# Patient Record
Sex: Female | Born: 1965 | Race: White | Hispanic: No | Marital: Single | Smoking: Never smoker
Health system: Southern US, Community
[De-identification: ages and names within clinical notes are randomized; demographics above are authoritative.]

## PROBLEM LIST (undated history)

## (undated) DIAGNOSIS — L719 Rosacea, unspecified: Secondary | ICD-10-CM

## (undated) DIAGNOSIS — Z8719 Personal history of other diseases of the digestive system: Secondary | ICD-10-CM

## (undated) DIAGNOSIS — R011 Cardiac murmur, unspecified: Secondary | ICD-10-CM

## (undated) DIAGNOSIS — M19042 Primary osteoarthritis, left hand: Secondary | ICD-10-CM

## (undated) DIAGNOSIS — M797 Fibromyalgia: Secondary | ICD-10-CM

## (undated) DIAGNOSIS — K219 Gastro-esophageal reflux disease without esophagitis: Secondary | ICD-10-CM

## (undated) DIAGNOSIS — F329 Major depressive disorder, single episode, unspecified: Secondary | ICD-10-CM

## (undated) DIAGNOSIS — G43909 Migraine, unspecified, not intractable, without status migrainosus: Secondary | ICD-10-CM

## (undated) DIAGNOSIS — E7800 Pure hypercholesterolemia, unspecified: Secondary | ICD-10-CM

## (undated) DIAGNOSIS — Q2112 Patent foramen ovale: Secondary | ICD-10-CM

## (undated) DIAGNOSIS — D649 Anemia, unspecified: Secondary | ICD-10-CM

## (undated) DIAGNOSIS — I10 Essential (primary) hypertension: Secondary | ICD-10-CM

## (undated) DIAGNOSIS — F32A Depression, unspecified: Secondary | ICD-10-CM

## (undated) DIAGNOSIS — Z8742 Personal history of other diseases of the female genital tract: Secondary | ICD-10-CM

## (undated) DIAGNOSIS — T4145XA Adverse effect of unspecified anesthetic, initial encounter: Secondary | ICD-10-CM

## (undated) DIAGNOSIS — M1990 Unspecified osteoarthritis, unspecified site: Secondary | ICD-10-CM

## (undated) DIAGNOSIS — Q211 Atrial septal defect: Secondary | ICD-10-CM

## (undated) DIAGNOSIS — T8859XA Other complications of anesthesia, initial encounter: Secondary | ICD-10-CM

## (undated) DIAGNOSIS — I639 Cerebral infarction, unspecified: Secondary | ICD-10-CM

## (undated) DIAGNOSIS — J45909 Unspecified asthma, uncomplicated: Secondary | ICD-10-CM

## (undated) DIAGNOSIS — H349 Unspecified retinal vascular occlusion: Secondary | ICD-10-CM

## (undated) DIAGNOSIS — M1611 Unilateral primary osteoarthritis, right hip: Secondary | ICD-10-CM

## (undated) DIAGNOSIS — R51 Headache: Secondary | ICD-10-CM

## (undated) DIAGNOSIS — M19041 Primary osteoarthritis, right hand: Secondary | ICD-10-CM

## (undated) DIAGNOSIS — R519 Headache, unspecified: Secondary | ICD-10-CM

## (undated) DIAGNOSIS — G4700 Insomnia, unspecified: Secondary | ICD-10-CM

## (undated) HISTORY — PX: ABDOMINAL HYSTERECTOMY: SHX81

## (undated) HISTORY — DX: Insomnia, unspecified: G47.00

## (undated) HISTORY — DX: Pure hypercholesterolemia, unspecified: E78.00

## (undated) HISTORY — DX: Primary osteoarthritis, left hand: M19.042

## (undated) HISTORY — DX: Rosacea, unspecified: L71.9

## (undated) HISTORY — DX: Unilateral primary osteoarthritis, right hip: M16.11

## (undated) HISTORY — DX: Migraine, unspecified, not intractable, without status migrainosus: G43.909

## (undated) HISTORY — DX: Unspecified asthma, uncomplicated: J45.909

## (undated) HISTORY — PX: TOE SURGERY: SHX1073

## (undated) HISTORY — PX: KNEE ARTHROPLASTY: SHX992

## (undated) HISTORY — PX: COLONOSCOPY: SHX174

## (undated) HISTORY — PX: COLON SURGERY: SHX602

## (undated) HISTORY — DX: Primary osteoarthritis, right hand: M19.041

## (undated) HISTORY — PX: KNEE SURGERY: SHX244

## (undated) HISTORY — PX: APPENDECTOMY: SHX54

## (undated) HISTORY — DX: Personal history of other diseases of the female genital tract: Z87.42

## (undated) HISTORY — PX: ANKLE SURGERY: SHX546

---

## 1997-12-23 ENCOUNTER — Ambulatory Visit (HOSPITAL_COMMUNITY): Admission: RE | Admit: 1997-12-23 | Discharge: 1997-12-23 | Payer: Self-pay | Admitting: Orthopedic Surgery

## 1998-01-31 ENCOUNTER — Emergency Department (HOSPITAL_COMMUNITY): Admission: EM | Admit: 1998-01-31 | Discharge: 1998-01-31 | Payer: Self-pay | Admitting: Emergency Medicine

## 2007-05-31 ENCOUNTER — Emergency Department (HOSPITAL_COMMUNITY): Admission: EM | Admit: 2007-05-31 | Discharge: 2007-05-31 | Payer: Self-pay | Admitting: Emergency Medicine

## 2007-06-01 ENCOUNTER — Encounter (INDEPENDENT_AMBULATORY_CARE_PROVIDER_SITE_OTHER): Payer: Self-pay | Admitting: Emergency Medicine

## 2007-06-01 ENCOUNTER — Ambulatory Visit: Payer: Self-pay | Admitting: Surgery

## 2007-06-01 ENCOUNTER — Ambulatory Visit (HOSPITAL_COMMUNITY): Admission: RE | Admit: 2007-06-01 | Discharge: 2007-06-01 | Payer: Self-pay | Admitting: Emergency Medicine

## 2007-06-17 ENCOUNTER — Ambulatory Visit: Payer: Self-pay | Admitting: Surgery

## 2007-06-19 ENCOUNTER — Encounter: Admission: RE | Admit: 2007-06-19 | Discharge: 2007-06-19 | Payer: Self-pay | Admitting: Surgery

## 2007-06-25 ENCOUNTER — Encounter: Admission: RE | Admit: 2007-06-25 | Discharge: 2007-06-25 | Payer: Self-pay | Admitting: Family Medicine

## 2007-07-01 ENCOUNTER — Ambulatory Visit: Payer: Self-pay | Admitting: Surgery

## 2007-07-09 ENCOUNTER — Ambulatory Visit (HOSPITAL_COMMUNITY): Admission: RE | Admit: 2007-07-09 | Discharge: 2007-07-09 | Payer: Self-pay | Admitting: Cardiology

## 2007-07-09 ENCOUNTER — Encounter: Payer: Self-pay | Admitting: Cardiology

## 2008-11-18 ENCOUNTER — Encounter: Admission: RE | Admit: 2008-11-18 | Discharge: 2008-11-18 | Payer: Self-pay | Admitting: Family Medicine

## 2010-06-14 NOTE — Letter (Signed)
Jun 17, 2007    Re:  Charlotte Fox, CHANCELLOR                  DOB:  May 27, 1965    REASON FOR VISIT:  Left eye blindness, rule out carotid disease.   REFERRING PHYSICIAN:  Pam Drown, M.D. and Dr. Doris Cheadle. [place].   HISTORY:  This is a 45 year old female who, approximately 2 weeks ago  began having blurry vision in her left eye.  2 days after this, on  Friday, she found that she was missing aspects of her visual field, and  went to have this further evaluated.  She initially thought this was a  variation of her usual migraine aura.  However, when it did not go away,  she went to an ophthalmologist, Dr. Dione Booze.  The patient has undergone  retinal artery imaging, which reveals retinal artery occlusion.  She has  also undergone a full hypercoagulable workup by blood work, as well as a  carotid duplex ultrasound, which shows 40-60% stenosis of a left carotid  artery.  The patient continues to have issues with her vision.  However,  had not gotten worse.  Of note, the patient also has a history of  hypercholesterolemia with a total cholesterol in October of 2008 was  255.  Her triglycerides were 157.  Her LDL was 189.  She has been taking  Lipitor in the past, but secondary to cost, has not.  Her blood work has  been negative for hypercoagulable workup to date.  However, she does  have an elevated sedimentation rate at 46.   REVIEW OF SYSTEMS:  GENERAL:  Negative for weight gain or weight loss.  Negative for fevers or chills.  CARDIAC:  Negative.  PULMONARY:  Negative.  GI:  Positive for the above and hiatal hernia.  GU:  Negative.  VASCULAR:  Temporary blindness in 1 eye.  NEURO:  Positive for headaches, migraines with aura.  ORTHO:  Negative.  PSYCH:  Negative.  ENT:  Recent change in eyesight.  HEMATOLOGIC:  Negative.   PAST MEDICAL HISTORY:  Positive for hypertension and hyperlipidemia.  Migraine with aura.   FAMILY HISTORY:  Mother has arthritis.   SOCIAL HISTORY:  She is  single.  She is a [employer] administrator.  She  does not smoke.  She never smoked.  She does not drink.   MEDICATIONS:  Include Micardis 80/12.5 once a day.  Lipitor 20 mg per  day.  Darvocet p.r.n.  Midrin p.r.n.  Aspirin daily.   ALLERGIES:  Penicillin, Bactrim, Vicodin, Z-Pak, and erythromycin.   PHYSICAL EXAMINATION:  Blood pressure is 108/60, heart rate is 58.  In  general, she is well-appearing in no acute distress.  HEENT:  She is  normocephalic, atraumatic.  Pupils are equal.  Sclerae are anicteric.  Neck is supple.  There are no carotid bruits.  There is no JVD.  Cardiovascular is regular rate and rhythm without murmurs, rubs, or  gallops.  Pulmonary, lungs are clear bilaterally.  Abdomen is soft.  Extremities are warm and well-perfused.  Psych, she is alert and  oriented x3.  Neuro, cranial nerves 2-12 grossly intact.  Skin is  without rash.   ASSESSMENT AND PLAN:  Left eye blindness.   PLAN:  I had a long conversation with the patient, in excess of 30  minutes discussing possible etiologies for her problem.  I told her  that, from my perspective, if it were to be vascular in origin, it  would  fall into 1 or 2 categories, 1 being atherosclerosis and 2 being a  vasculitis.  To address the atherosclerosis based on our ultrasound, I  do not feel that this is going to be a significant stenosis.  However, I  feel that this needs to be better evaluated.  We talked about a CT scan  versus an arteriogram.  I feel that CT scan would be less invasive and  potentially give [place] more information, so I have scheduled her for a CT  angiogram of her chest, head, and neck.   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  She did have an elevated sed rate.  She is a relatively young female.  For this reason, I am going to have her be further evaluated by a  rheumatologist.  I also think, to complete her workup, she needs to have  an echocardiogram, which I am  scheduling.  I am going to have the  patient follow up with me in 2 weeks.   Jorge Ny, MD  Electronically Signed   VWB/MEDQ  D:  06/17/2007  T:  06/18/2007  Job:  [id number]   cc:   Pam Drown, M.D.  Dr. Doris Cheadle. [place]

## 2010-06-14 NOTE — Assessment & Plan Note (Signed)
OFFICE VISIT   Charlotte Fox, Charlotte Fox  DOB:  1965/09/23                                       07/01/2007  EAVWU#:[id number]   REASON FOR VISIT:  Followup.   HISTORY:  This is a 45 year old female that I saw for evaluation of  blurry vision in her left eye.  This was associated with a carotid  duplex which revealed 40-60% stenosis.  When I saw her, I was  essentially concerned about vasculitis, rather than atherosclerotic  disease given her age, as well as the fact that she had an elevated sed  rate.  I had referred her to rheumatology, Dr. Corliss Skains.  Since our  last visit she has been [facility] for an increase in her sed  rate which went to 79.  She was, however, at this time found to have a  bad sinus infection, so she was not treated with steroids.  Her sinus  infection is resolving and she is to be due to get her sed rate  rechecked.  I did order a CT angiogram of her head, neck and chest which  did not show any signs of vasculitis.  The stenosis within her carotid  found by ultrasound was found be minimal on CT scan.  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  It sounds like she is being scheduled to have this closed.   At this time I feel like there is no vascular surgical role in her care.  This is likely going to be medical management.  I am going to have her  follow up with me in 6 months.   Jorge Ny, MD  Electronically Signed   VWB/MEDQ  D:  07/01/2007  T:  07/02/2007  Job:  [id number]   cc:   Pam Drown, M.D.  Robert L. Dione Booze, M.D.  Dr. Corliss Skains

## 2011-08-01 ENCOUNTER — Ambulatory Visit: Payer: 59 | Attending: Family Medicine | Admitting: Physical Therapy

## 2011-08-01 DIAGNOSIS — M2550 Pain in unspecified joint: Secondary | ICD-10-CM | POA: Insufficient documentation

## 2011-08-01 DIAGNOSIS — M545 Low back pain, unspecified: Secondary | ICD-10-CM | POA: Insufficient documentation

## 2011-08-01 DIAGNOSIS — M6281 Muscle weakness (generalized): Secondary | ICD-10-CM | POA: Insufficient documentation

## 2011-08-01 DIAGNOSIS — IMO0001 Reserved for inherently not codable concepts without codable children: Secondary | ICD-10-CM | POA: Insufficient documentation

## 2011-08-01 DIAGNOSIS — R269 Unspecified abnormalities of gait and mobility: Secondary | ICD-10-CM | POA: Insufficient documentation

## 2011-08-02 ENCOUNTER — Encounter: Payer: 59 | Admitting: Physical Therapy

## 2011-08-07 ENCOUNTER — Ambulatory Visit: Payer: 59 | Admitting: Physical Therapy

## 2011-08-08 ENCOUNTER — Ambulatory Visit: Payer: 59 | Admitting: Physical Therapy

## 2011-08-15 ENCOUNTER — Ambulatory Visit: Payer: 59 | Admitting: Physical Therapy

## 2011-08-17 ENCOUNTER — Ambulatory Visit: Payer: 59 | Admitting: Physical Therapy

## 2011-08-31 ENCOUNTER — Ambulatory Visit: Payer: 59 | Attending: Family Medicine | Admitting: Physical Therapy

## 2011-08-31 DIAGNOSIS — M2550 Pain in unspecified joint: Secondary | ICD-10-CM | POA: Insufficient documentation

## 2011-08-31 DIAGNOSIS — R269 Unspecified abnormalities of gait and mobility: Secondary | ICD-10-CM | POA: Insufficient documentation

## 2011-08-31 DIAGNOSIS — M545 Low back pain, unspecified: Secondary | ICD-10-CM | POA: Insufficient documentation

## 2011-08-31 DIAGNOSIS — M6281 Muscle weakness (generalized): Secondary | ICD-10-CM | POA: Insufficient documentation

## 2011-08-31 DIAGNOSIS — IMO0001 Reserved for inherently not codable concepts without codable children: Secondary | ICD-10-CM | POA: Insufficient documentation

## 2011-09-11 ENCOUNTER — Ambulatory Visit: Payer: 59 | Admitting: Physical Therapy

## 2011-09-18 ENCOUNTER — Encounter: Payer: 59 | Admitting: Physical Therapy

## 2012-01-17 ENCOUNTER — Other Ambulatory Visit: Payer: Self-pay | Admitting: Family Medicine

## 2012-01-17 DIAGNOSIS — R1032 Left lower quadrant pain: Secondary | ICD-10-CM

## 2012-01-18 ENCOUNTER — Ambulatory Visit
Admission: RE | Admit: 2012-01-18 | Discharge: 2012-01-18 | Disposition: A | Discharge status: Home / Self Care | Payer: 59 | Source: Ambulatory Visit | Attending: Family Medicine | Admitting: Family Medicine

## 2012-01-18 DIAGNOSIS — R1032 Left lower quadrant pain: Secondary | ICD-10-CM

## 2012-01-18 MED ORDER — IOHEXOL 300 MG/ML  SOLN
125.0000 mL | Freq: Once | INTRAMUSCULAR | Status: AC | PRN
  Administered 2012-01-18: 125 mL via INTRAVENOUS

## 2012-01-19 ENCOUNTER — Telehealth: Payer: Self-pay | Admitting: Gynecologic Oncology

## 2012-01-19 ENCOUNTER — Ambulatory Visit
Admission: RE | Admit: 2012-01-19 | Discharge: 2012-01-19 | Disposition: A | Discharge status: Home / Self Care | Payer: 59 | Source: Ambulatory Visit | Attending: Family Medicine | Admitting: Family Medicine

## 2012-01-19 ENCOUNTER — Other Ambulatory Visit: Payer: Self-pay | Admitting: Family Medicine

## 2012-01-19 NOTE — Telephone Encounter (Signed)
Initially spoke with Dr. Gweneth Dimitri about scheduling the patient for a new patient appointment for evaluation of a large, soft tissue mass possibly arising from the right adnexa.  No available appointments in the [place] office until Jan. 2, 2013 and she reports that the patient is uncomfortable.  [facility] contacted and new patient appt arranged for 01/22/12 at 1:30pm with Dr. Kyla Balzarine.  Dr. Corliss Blacker notified and to fax the records to [facility].  Pt notified and given directions.  No concerns voiced.  Instructed to call the [facility] or [facility] office for any questions or concerns.

## 2012-07-23 ENCOUNTER — Ambulatory Visit: Payer: Self-pay | Admitting: Family Medicine

## 2012-07-29 ENCOUNTER — Ambulatory Visit: Payer: Self-pay | Admitting: Family Medicine

## 2012-10-09 DIAGNOSIS — H34232 Retinal artery branch occlusion, left eye: Secondary | ICD-10-CM | POA: Insufficient documentation

## 2012-10-09 DIAGNOSIS — H469 Unspecified optic neuritis: Secondary | ICD-10-CM | POA: Insufficient documentation

## 2012-10-09 DIAGNOSIS — H35069 Retinal vasculitis, unspecified eye: Secondary | ICD-10-CM | POA: Insufficient documentation

## 2013-01-29 ENCOUNTER — Ambulatory Visit: Payer: Self-pay | Admitting: Family Medicine

## 2013-10-19 ENCOUNTER — Encounter (HOSPITAL_COMMUNITY): Payer: Self-pay | Admitting: Emergency Medicine

## 2013-10-19 ENCOUNTER — Emergency Department (HOSPITAL_COMMUNITY)
Admission: EM | Admit: 2013-10-19 | Discharge: 2013-10-19 | Disposition: A | Discharge status: Home / Self Care | Payer: 59 | Source: Home / Self Care | Attending: Emergency Medicine | Admitting: Emergency Medicine

## 2013-10-19 DIAGNOSIS — L02619 Cutaneous abscess of unspecified foot: Secondary | ICD-10-CM

## 2013-10-19 DIAGNOSIS — K219 Gastro-esophageal reflux disease without esophagitis: Secondary | ICD-10-CM | POA: Insufficient documentation

## 2013-10-19 DIAGNOSIS — Z880 Allergy status to penicillin: Secondary | ICD-10-CM | POA: Insufficient documentation

## 2013-10-19 DIAGNOSIS — L03119 Cellulitis of unspecified part of limb: Secondary | ICD-10-CM

## 2013-10-19 DIAGNOSIS — M129 Arthropathy, unspecified: Secondary | ICD-10-CM

## 2013-10-19 DIAGNOSIS — M7989 Other specified soft tissue disorders: Secondary | ICD-10-CM

## 2013-10-19 DIAGNOSIS — I10 Essential (primary) hypertension: Secondary | ICD-10-CM

## 2013-10-19 DIAGNOSIS — E7800 Pure hypercholesterolemia, unspecified: Secondary | ICD-10-CM

## 2013-10-19 DIAGNOSIS — Z79899 Other long term (current) drug therapy: Secondary | ICD-10-CM | POA: Insufficient documentation

## 2013-10-19 DIAGNOSIS — M79609 Pain in unspecified limb: Secondary | ICD-10-CM

## 2013-10-19 HISTORY — DX: Pure hypercholesterolemia, unspecified: E78.00

## 2013-10-19 HISTORY — DX: Unspecified osteoarthritis, unspecified site: M19.90

## 2013-10-19 HISTORY — DX: Unspecified retinal vascular occlusion: H34.9

## 2013-10-19 HISTORY — DX: Essential (primary) hypertension: I10

## 2013-10-19 HISTORY — DX: Gastro-esophageal reflux disease without esophagitis: K21.9

## 2013-10-19 LAB — CBC WITH DIFFERENTIAL/PLATELET
BASOS ABS: 0 10*3/uL (ref 0.0–0.1)
Basophils Relative: 0 % (ref 0–1)
Eosinophils Absolute: 0 10*3/uL (ref 0.0–0.7)
Eosinophils Relative: 0 % (ref 0–5)
HEMATOCRIT: 39.1 % (ref 36.0–46.0)
Hemoglobin: 12.9 g/dL (ref 12.0–15.0)
LYMPHS ABS: 1.1 10*3/uL (ref 0.7–4.0)
LYMPHS PCT: 8 % — AB (ref 12–46)
MCH: 28.9 pg (ref 26.0–34.0)
MCHC: 33 g/dL (ref 30.0–36.0)
MCV: 87.7 fL (ref 78.0–100.0)
MONO ABS: 0.7 10*3/uL (ref 0.1–1.0)
Monocytes Relative: 6 % (ref 3–12)
NEUTROS ABS: 10.6 10*3/uL — AB (ref 1.7–7.7)
NEUTROS PCT: 86 % — AB (ref 43–77)
Platelets: 219 10*3/uL (ref 150–400)
RBC: 4.46 MIL/uL (ref 3.87–5.11)
RDW: 14.2 % (ref 11.5–15.5)
WBC: 12.4 10*3/uL — ABNORMAL HIGH (ref 4.0–10.5)

## 2013-10-19 LAB — BASIC METABOLIC PANEL
ANION GAP: 13 (ref 5–15)
BUN: 20 mg/dL (ref 6–23)
CALCIUM: 9.1 mg/dL (ref 8.4–10.5)
CO2: 24 meq/L (ref 19–32)
CREATININE: 1.12 mg/dL — AB (ref 0.50–1.10)
Chloride: 100 mEq/L (ref 96–112)
GFR calc Af Amer: 67 mL/min — ABNORMAL LOW (ref >90)
GFR calc non Af Amer: 58 mL/min — ABNORMAL LOW (ref >90)
Glucose, Bld: 134 mg/dL — ABNORMAL HIGH (ref 70–99)
POTASSIUM: 3.3 meq/L — AB (ref 3.7–5.3)
SODIUM: 137 meq/L (ref 137–147)

## 2013-10-19 NOTE — ED Notes (Signed)
Pt aware urine sample is needed, cup provided 

## 2013-10-19 NOTE — ED Notes (Signed)
US at bedside

## 2013-10-19 NOTE — ED Provider Notes (Signed)
Medical screening examination/treatment/procedure(s) were conducted as a shared visit with non-physician practitioner(s) and myself.  I personally evaluated the patient during the encounter.   EKG Interpretation None      48 year old female presenting with swelling and redness in her right lower leg.  On exam, well appearing, nontoxic, not distressed, normal respiratory effort, normal perfusion, right leg with mild edema, erythema from foot to mid shin, increased work of, tenderness to palpation of skin, skin indurated, no masses or fluctuance. Normal right DP pulses.  DVT study negative. Exam consistent with cellulitis. She was given antibiotics by urgent care prior to coming to the emergency department and will take those. Given return precautions.  Clinical Impression: 1. Cellulitis of foot       Candyce Churn III, MD 10/19/13 2147

## 2013-10-19 NOTE — ED Notes (Signed)
Pt c/o right lower leg swelling and erythema that has gotten worse over the past day or so. Pt was seen at Urgent care and referred here to rule out DVT.

## 2013-10-19 NOTE — Progress Notes (Signed)
VASCULAR LAB PRELIMINARY  PRELIMINARY  PRELIMINARY  PRELIMINARY  Right lower extremity venous Doppler completed.    Preliminary report:  There is no DVT or SVT noted in the right lower extremity.  Mahonri Seiden, RVT 10/19/2013, 8:09 PM

## 2013-10-19 NOTE — ED Provider Notes (Signed)
CSN: [id number]     Arrival date & time 10/19/13  1837 History   First MD Initiated Contact with Patient 10/19/13 1858     Chief Complaint  Patient presents with  . Leg Swelling     (Consider location/radiation/quality/duration/timing/severity/associated sxs/prior Treatment) HPI Charlotte Fox is a 48 y.o. female history of hypertension, hyperlipidemia, retinal artery occlusion comes in for evaluation of right foot pain. She was seen earlier today at an urgent care center and sent over here for DVT rule out. Patient reports Friday evening she noticed her foot was getting sore and woke up Saturday morning with a temperature of 99.5, her foot swollen and red.She denies any injuries or recent infections. She reports lying in the bed with her foot propped up relieves the pain, leg or foot pain over the bed makes it worse. She tried Tylenol and ibuprofen without relief. Denies any difficulty breathing, chest pain, numbness or weakness  Past Medical History  Diagnosis Date  . Hypertension   . High cholesterol   . Arthritis   . Acid reflux   . Retinal artery occlusion    Past Surgical History  Procedure Laterality Date  . Abdominal hysterectomy    . Knee surgery    . Toe surgery     No family history on file. History  Substance Use Topics  . Smoking status: Never Smoker   . Smokeless tobacco: Not on file  . Alcohol Use: No   OB History   Grav Para Term Preterm Abortions TAB SAB Ect Mult Living                 Review of Systems  Constitutional: Negative for fever.  HENT: Negative for sore throat.   Eyes: Negative for visual disturbance.  Respiratory: Negative for shortness of breath.   Cardiovascular: Positive for leg swelling. Negative for chest pain.       Swelling to right foot  Gastrointestinal: Negative for abdominal pain.  Endocrine: Negative for polyuria.  Genitourinary: Negative for dysuria.  Skin: Negative for rash.  Neurological: Negative for weakness, numbness  and headaches.      Allergies  Sulfa antibiotics; Levaquin; Penicillins; and Vicodin  Home Medications   Prior to Admission medications   Medication Sig Start Date End Date Taking? Authorizing Provider  acetaminophen (TYLENOL) 500 MG tablet Take 500 mg by mouth every 6 (six) hours as needed for mild pain.   Yes Historical Provider, MD  atorvastatin (LIPITOR) 80 MG tablet Take 80 mg by mouth daily.   Yes Historical Provider, MD  cholecalciferol (VITAMIN D) 1000 UNITS tablet Take 4,000 Units by mouth daily.   Yes Historical Provider, MD  cyclobenzaprine (FLEXERIL) 10 MG tablet Take 10 mg by mouth 3 (three) times daily as needed for muscle spasms.   Yes Historical Provider, MD  estradiol (CLIMARA - DOSED IN MG/24 HR) 0.05 mg/24hr patch Place 0.05 mg onto the skin once a week. Change patch every Tuesday.   Yes Historical Provider, MD  FLUoxetine (PROZAC) 20 MG capsule Take 20 mg by mouth daily.   Yes Historical Provider, MD  ibuprofen (ADVIL,MOTRIN) 200 MG tablet Take 200 mg by mouth every 6 (six) hours as needed for mild pain.   Yes Historical Provider, MD  Misc Natural Products (TART CHERRY ADVANCED PO) Take 1 capsule by mouth daily at 12 noon.   Yes Historical Provider, MD  omeprazole (PRILOSEC) 20 MG capsule Take 20 mg by mouth daily.   Yes Historical Provider, MD  promethazine (PHENERGAN)  25 MG tablet Take 25 mg by mouth every 6 (six) hours as needed for nausea or vomiting.   Yes Historical Provider, MD  telmisartan (MICARDIS) 80 MG tablet Take 80 mg by mouth daily.   Yes Historical Provider, MD  traMADol (ULTRAM) 50 MG tablet Take by mouth every 6 (six) hours as needed for moderate pain.   Yes Historical Provider, MD   BP 108/61  Pulse 88  Temp(Src) 98.1 F (36.7 C) (Oral)  Resp 18  Ht 5' 6.5" (1.689 m)  Wt 236 lb 6 oz (107.219 kg)  BMI 37.58 kg/m2  SpO2 100%  LMP 12/31/2011 Physical Exam  Nursing note and vitals reviewed. Constitutional:  Awake, alert, nontoxic appearance  with baseline speech for patient.  HENT:  Head: Atraumatic.  Mouth/Throat: No oropharyngeal exudate.  Eyes: EOM are normal. Pupils are equal, round, and reactive to light. Right eye exhibits no discharge. Left eye exhibits no discharge.  Neck: Neck supple.  Cardiovascular: Normal rate and regular rhythm.   No murmur heard. Pulmonary/Chest: Effort normal and breath sounds normal. No stridor. No respiratory distress. She has no wheezes. She has no rales. She exhibits no tenderness.  Abdominal: Soft. Bowel sounds are normal. She exhibits no mass. There is no tenderness. There is no rebound.  Musculoskeletal: She exhibits no tenderness.  Baseline ROM, moves extremities with no obvious new focal weakness.  Lymphadenopathy:    She has no cervical adenopathy.  Neurological:  Awake, alert, cooperative and aware of situation; motor strength bilaterally; sensation normal to light touch bilaterally; no facial asymmetry; tongue midline; major cranial nerves appear intact; baseline gait without new ataxia.  Skin: No rash noted. There is erythema.  Hot to touch over swollen, erythematous areas.  Psychiatric: She has a normal mood and affect.        ED Course  Procedures (including critical care time) Labs Review Labs Reviewed  CBC WITH DIFFERENTIAL - Abnormal; Notable for the following:    WBC 12.4 (*)    Neutrophils Relative % 86 (*)    Neutro Abs 10.6 (*)    Lymphocytes Relative 8 (*)    All other components within normal limits  BASIC METABOLIC PANEL - Abnormal; Notable for the following:    Potassium 3.3 (*)    Glucose, Bld 134 (*)    Creatinine, Ser 1.12 (*)    GFR calc non Af Amer 58 (*)    GFR calc Af Amer 67 (*)    All other components within normal limits  URINALYSIS, ROUTINE W REFLEX MICROSCOPIC    Imaging Review No results found.   EKG Interpretation None      MDM  Vitals stable - WNL -afebrile Pt resting comfortably in ED. US showed no DVT. PE consistent with  cellulitis. Clinical picture not consistent with necrotizing fasciitis. No evidence of vascular compromise. Patient heart he has a prescription for doxycycline she received from the urgent care center earlier. She may continue with this treatment. Patient reports having excellent access to care, and sees Selena Batten for primary care. Discussed f/u with PCP and return precautions, pt very amenable to plan.  Prior to patient discharge, I discussed and reviewed this case with Dr.Wofford.    Final diagnoses:  Cellulitis of foot        Sharlene Motts, PA-C 10/20/13 1326

## 2013-10-20 ENCOUNTER — Inpatient Hospital Stay (HOSPITAL_COMMUNITY): Payer: 59

## 2013-10-20 ENCOUNTER — Encounter (HOSPITAL_COMMUNITY): Payer: Self-pay | Admitting: Emergency Medicine

## 2013-10-20 ENCOUNTER — Inpatient Hospital Stay (HOSPITAL_COMMUNITY)
Admission: EM | Admit: 2013-10-20 | Discharge: 2013-10-24 | DRG: 603 | Disposition: A | Discharge status: Home / Self Care | Payer: 59 | Attending: Internal Medicine | Admitting: Internal Medicine

## 2013-10-20 DIAGNOSIS — I10 Essential (primary) hypertension: Secondary | ICD-10-CM | POA: Diagnosis present

## 2013-10-20 DIAGNOSIS — M129 Arthropathy, unspecified: Secondary | ICD-10-CM | POA: Diagnosis present

## 2013-10-20 DIAGNOSIS — K219 Gastro-esophageal reflux disease without esophagitis: Secondary | ICD-10-CM | POA: Diagnosis present

## 2013-10-20 DIAGNOSIS — Z6838 Body mass index (BMI) 38.0-38.9, adult: Secondary | ICD-10-CM | POA: Diagnosis not present

## 2013-10-20 DIAGNOSIS — E7800 Pure hypercholesterolemia, unspecified: Secondary | ICD-10-CM | POA: Diagnosis present

## 2013-10-20 DIAGNOSIS — E876 Hypokalemia: Secondary | ICD-10-CM | POA: Diagnosis present

## 2013-10-20 DIAGNOSIS — M7989 Other specified soft tissue disorders: Secondary | ICD-10-CM | POA: Diagnosis present

## 2013-10-20 DIAGNOSIS — F39 Unspecified mood [affective] disorder: Secondary | ICD-10-CM | POA: Diagnosis present

## 2013-10-20 DIAGNOSIS — D649 Anemia, unspecified: Secondary | ICD-10-CM | POA: Diagnosis present

## 2013-10-20 DIAGNOSIS — R112 Nausea with vomiting, unspecified: Secondary | ICD-10-CM | POA: Diagnosis present

## 2013-10-20 DIAGNOSIS — Z79899 Other long term (current) drug therapy: Secondary | ICD-10-CM | POA: Diagnosis not present

## 2013-10-20 DIAGNOSIS — L03115 Cellulitis of right lower limb: Secondary | ICD-10-CM | POA: Diagnosis present

## 2013-10-20 DIAGNOSIS — L02619 Cutaneous abscess of unspecified foot: Secondary | ICD-10-CM | POA: Diagnosis present

## 2013-10-20 DIAGNOSIS — L03119 Cellulitis of unspecified part of limb: Principal | ICD-10-CM

## 2013-10-20 LAB — COMPREHENSIVE METABOLIC PANEL
ALT: 18 U/L (ref 0–35)
AST: 16 U/L (ref 0–37)
Albumin: 3.5 g/dL (ref 3.5–5.2)
Alkaline Phosphatase: 86 U/L (ref 39–117)
Anion gap: 16 — ABNORMAL HIGH (ref 5–15)
BUN: 19 mg/dL (ref 6–23)
CO2: 22 mEq/L (ref 19–32)
Calcium: 9.5 mg/dL (ref 8.4–10.5)
Chloride: 98 mEq/L (ref 96–112)
Creatinine, Ser: 1.06 mg/dL (ref 0.50–1.10)
GFR calc Af Amer: 71 mL/min — ABNORMAL LOW (ref >90)
GFR calc non Af Amer: 62 mL/min — ABNORMAL LOW (ref >90)
Glucose, Bld: 111 mg/dL — ABNORMAL HIGH (ref 70–99)
Potassium: 4 mEq/L (ref 3.7–5.3)
Sodium: 136 mEq/L — ABNORMAL LOW (ref 137–147)
Total Bilirubin: 0.7 mg/dL (ref 0.3–1.2)
Total Protein: 8.5 g/dL — ABNORMAL HIGH (ref 6.0–8.3)

## 2013-10-20 LAB — CBC WITH DIFFERENTIAL/PLATELET
Basophils Absolute: 0 10*3/uL (ref 0.0–0.1)
Basophils Relative: 0 % (ref 0–1)
Eosinophils Absolute: 0 10*3/uL (ref 0.0–0.7)
Eosinophils Relative: 0 % (ref 0–5)
HCT: 41.2 % (ref 36.0–46.0)
Hemoglobin: 13.7 g/dL (ref 12.0–15.0)
Lymphocytes Relative: 10 % — ABNORMAL LOW (ref 12–46)
Lymphs Abs: 1.3 10*3/uL (ref 0.7–4.0)
MCH: 29.3 pg (ref 26.0–34.0)
MCHC: 33.3 g/dL (ref 30.0–36.0)
MCV: 88.2 fL (ref 78.0–100.0)
Monocytes Absolute: 0.8 10*3/uL (ref 0.1–1.0)
Monocytes Relative: 7 % (ref 3–12)
Neutro Abs: 10.6 10*3/uL — ABNORMAL HIGH (ref 1.7–7.7)
Neutrophils Relative %: 83 % — ABNORMAL HIGH (ref 43–77)
Platelets: 266 10*3/uL (ref 150–400)
RBC: 4.67 MIL/uL (ref 3.87–5.11)
RDW: 14.3 % (ref 11.5–15.5)
WBC: 12.7 10*3/uL — ABNORMAL HIGH (ref 4.0–10.5)

## 2013-10-20 LAB — CK: CK TOTAL: 34 U/L (ref 7–177)

## 2013-10-20 MED ORDER — ENOXAPARIN SODIUM 60 MG/0.6ML ~~LOC~~ SOLN
50.0000 mg | SUBCUTANEOUS | Status: DC
  Administered 2013-10-20 – 2013-10-23 (×4): 50 mg via SUBCUTANEOUS
  Filled 2013-10-20 (×5): qty 0.6

## 2013-10-20 MED ORDER — ESTRADIOL 0.05 MG/24HR TD PTWK
0.0500 mg | MEDICATED_PATCH | TRANSDERMAL | Status: DC
  Administered 2013-10-21: 0.05 mg via TRANSDERMAL
  Filled 2013-10-20: qty 1

## 2013-10-20 MED ORDER — VANCOMYCIN HCL IN DEXTROSE 1-5 GM/200ML-% IV SOLN
1000.0000 mg | Freq: Two times a day (BID) | INTRAVENOUS | Status: DC
  Administered 2013-10-21 – 2013-10-23 (×5): 1000 mg via INTRAVENOUS
  Filled 2013-10-20 (×5): qty 200

## 2013-10-20 MED ORDER — SODIUM CHLORIDE 0.9 % IV SOLN
INTRAVENOUS | Status: DC
  Administered 2013-10-21: 02:00:00 via INTRAVENOUS

## 2013-10-20 MED ORDER — ACETAMINOPHEN 650 MG RE SUPP: 650.0000 mg | Freq: Four times a day (QID) | RECTAL | Status: DC | PRN

## 2013-10-20 MED ORDER — ACETAMINOPHEN 325 MG PO TABS
650.0000 mg | ORAL_TABLET | Freq: Four times a day (QID) | ORAL | Status: DC | PRN
  Administered 2013-10-23: 650 mg via ORAL
  Filled 2013-10-20: qty 2

## 2013-10-20 MED ORDER — FLUOXETINE HCL 20 MG PO CAPS
20.0000 mg | ORAL_CAPSULE | Freq: Every day | ORAL | Status: DC
  Administered 2013-10-21 – 2013-10-24 (×4): 20 mg via ORAL
  Filled 2013-10-20 (×5): qty 1

## 2013-10-20 MED ORDER — ONDANSETRON HCL 4 MG/2ML IJ SOLN: 4.0000 mg | Freq: Four times a day (QID) | INTRAMUSCULAR | Status: DC | PRN

## 2013-10-20 MED ORDER — FAMOTIDINE IN NACL 20-0.9 MG/50ML-% IV SOLN
20.0000 mg | Freq: Two times a day (BID) | INTRAVENOUS | Status: DC
  Administered 2013-10-21 – 2013-10-22 (×2): 20 mg via INTRAVENOUS
  Filled 2013-10-20 (×6): qty 50

## 2013-10-20 MED ORDER — HYDROMORPHONE HCL 1 MG/ML IJ SOLN
1.0000 mg | Freq: Once | INTRAMUSCULAR | Status: DC
  Filled 2013-10-20: qty 1

## 2013-10-20 MED ORDER — CYCLOBENZAPRINE HCL 10 MG PO TABS
10.0000 mg | ORAL_TABLET | Freq: Three times a day (TID) | ORAL | Status: DC | PRN
  Filled 2013-10-20: qty 1

## 2013-10-20 MED ORDER — ONDANSETRON HCL 4 MG PO TABS: 4.0000 mg | ORAL_TABLET | Freq: Four times a day (QID) | ORAL | Status: DC | PRN

## 2013-10-20 MED ORDER — SODIUM CHLORIDE 0.9 % IV BOLUS (SEPSIS)
1000.0000 mL | Freq: Once | INTRAVENOUS | Status: AC
  Administered 2013-10-20: 1000 mL via INTRAVENOUS

## 2013-10-20 MED ORDER — HYDROMORPHONE HCL 1 MG/ML IJ SOLN
0.5000 mg | INTRAMUSCULAR | Status: DC | PRN
  Administered 2013-10-20 – 2013-10-21 (×3): 0.5 mg via INTRAVENOUS
  Filled 2013-10-20 (×4): qty 1

## 2013-10-20 MED ORDER — OXYCODONE-ACETAMINOPHEN 5-325 MG PO TABS
1.0000 | ORAL_TABLET | Freq: Four times a day (QID) | ORAL | Status: DC | PRN
  Administered 2013-10-20 – 2013-10-21 (×2): 1 via ORAL
  Administered 2013-10-21: 2 via ORAL
  Administered 2013-10-21: 1 via ORAL
  Filled 2013-10-20 (×2): qty 1
  Filled 2013-10-20: qty 2
  Filled 2013-10-20: qty 1

## 2013-10-20 MED ORDER — VANCOMYCIN HCL 10 G IV SOLR
2000.0000 mg | Freq: Once | INTRAVENOUS | Status: AC
  Administered 2013-10-20: 2000 mg via INTRAVENOUS
  Filled 2013-10-20: qty 2000

## 2013-10-20 MED ORDER — ONDANSETRON HCL 4 MG/2ML IJ SOLN
4.0000 mg | Freq: Once | INTRAMUSCULAR | Status: DC
  Filled 2013-10-20: qty 2

## 2013-10-20 NOTE — ED Provider Notes (Signed)
CSN: [id number]     Arrival date & time 10/20/13  1405 History   First MD Initiated Contact with Patient 10/20/13 1930     Chief Complaint  Patient presents with  . Emesis  . Nausea     (Consider location/radiation/quality/duration/timing/severity/associated sxs/prior Treatment) HPI  48 year old female with cellulitis her right lower extremity. Patient was just seen in the emergency room for the same. She was prescribed doxycycline. She reports that she has been unable to keep this down secondary to nausea/vomiting. Her symptoms have progressed since last seen. She reports increasing pain, swelling and redness. Subjective fever. Feels achy all over. Has been taking ibuprofen and Tylenol which helps with her fever but does not provide any significant pain relief.  Past Medical History  Diagnosis Date  . Hypertension   . High cholesterol   . Arthritis   . Acid reflux   . Retinal artery occlusion    Past Surgical History  Procedure Laterality Date  . Abdominal hysterectomy    . Knee surgery    . Toe surgery     History reviewed. No pertinent family history. History  Substance Use Topics  . Smoking status: Never Smoker   . Smokeless tobacco: Never Used  . Alcohol Use: No   OB History   Grav Para Term Preterm Abortions TAB SAB Ect Mult Living                 Review of Systems  All systems reviewed and negative, other than as noted in HPI.   Allergies  Erythromycin; Sulfa antibiotics; Azithromycin; Levaquin; Penicillins; and Vicodin  Home Medications   Prior to Admission medications   Medication Sig Start Date End Date Taking? Authorizing Provider  acetaminophen (TYLENOL) 500 MG tablet Take 1,000 mg by mouth every 6 (six) hours as needed for mild pain.    Yes Historical Provider, MD  atorvastatin (LIPITOR) 80 MG tablet Take 80 mg by mouth daily.   Yes Historical Provider, MD  cholecalciferol (VITAMIN D) 1000 UNITS tablet Take 4,000 Units by mouth daily.   Yes  Historical Provider, MD  cyclobenzaprine (FLEXERIL) 10 MG tablet Take 10 mg by mouth 3 (three) times daily as needed for muscle spasms.   Yes Historical Provider, MD  estradiol (CLIMARA - DOSED IN MG/24 HR) 0.05 mg/24hr patch Place 0.05 mg onto the skin once a week. Change patch every Tuesday.   Yes Historical Provider, MD  FLUoxetine (PROZAC) 20 MG capsule Take 20 mg by mouth daily.   Yes Historical Provider, MD  ibuprofen (ADVIL,MOTRIN) 200 MG tablet Take 600 mg by mouth every 6 (six) hours as needed for mild pain.    Yes Historical Provider, MD  Misc Natural Products (TART CHERRY ADVANCED PO) Take 1 capsule by mouth daily at 12 noon.   Yes Historical Provider, MD  omeprazole (PRILOSEC) 20 MG capsule Take 20 mg by mouth daily.   Yes Historical Provider, MD  promethazine (PHENERGAN) 25 MG tablet Take 25 mg by mouth every 6 (six) hours as needed for nausea or vomiting.   Yes Historical Provider, MD  telmisartan (MICARDIS) 80 MG tablet Take 80 mg by mouth daily.   Yes Historical Provider, MD  traMADol (ULTRAM) 50 MG tablet Take 50 mg by mouth every 6 (six) hours as needed for moderate pain.    Yes Historical Provider, MD   BP 141/65  Pulse 87  Temp(Src) 99.4 F (37.4 C) (Oral)  Resp 18  SpO2 100%  LMP 12/31/2011 Physical Exam  Nursing  note and vitals reviewed. Constitutional: She appears well-developed and well-nourished. No distress.  HENT:  Head: Normocephalic and atraumatic.  Eyes: Conjunctivae are normal. Right eye exhibits no discharge. Left eye exhibits no discharge.  Neck: Neck supple.  Cardiovascular: Normal rate, regular rhythm and normal heart sounds.  Exam reveals no gallop and no friction rub.   No murmur heard. Pulmonary/Chest: Effort normal and breath sounds normal. No respiratory distress.  Abdominal: Soft. She exhibits no distension. There is no tenderness.  Musculoskeletal: She exhibits no edema and no tenderness.  Neurological: She is alert.  Skin: Skin is dry.   Erythema, tenderness and increased warmth to the dorsum of the right foot extending his leg to the knee shin. Slightly worse in appearance as compared to recent pictures.   Psychiatric: She has a normal mood and affect. Her behavior is normal. Thought content normal.    ED Course  Procedures (including critical care time) Labs Review Labs Reviewed  CBC WITH DIFFERENTIAL - Abnormal; Notable for the following:    WBC 12.7 (*)    Neutrophils Relative % 83 (*)    Neutro Abs 10.6 (*)    Lymphocytes Relative 10 (*)    All other components within normal limits  COMPREHENSIVE METABOLIC PANEL  SEDIMENTATION RATE  C-REACTIVE PROTEIN  CK  URINALYSIS, ROUTINE W REFLEX MICROSCOPIC    Imaging Review No results found.   EKG Interpretation None      MDM   Final diagnoses:  Cellulitis of leg, right  Non-intractable vomiting with nausea, vomiting of unspecified type    48yF with cellulitis to R foot/lower leg. Progression of symptoms. Oral temp 99.9. She does not appear toxic but reports unable to keep PO abx down. Will admit for symptoms control/IV abx.     Raeford Razor, MD 10/20/13 2126

## 2013-10-20 NOTE — ED Notes (Signed)
Attempted IV site start x 2 without success

## 2013-10-20 NOTE — ED Notes (Addendum)
Pt c/o n/v and states she was given antibiotics last night and cannot keep them down. Pt seen for right lower leg swelling.

## 2013-10-20 NOTE — H&P (Signed)
[facility] History and Physical  Patient: Charlotte Fox  QQP:[id number]  DOB: 1965/08/30  DOS: the patient was seen and examined on 10/20/2013 PCP: MCNEILL,WENDY, MD  Chief Complaint: Right leg redness and swelling with nausea and vomiting  HPI: Charlotte Fox is a 48 y.o. female with Past medical history of hypertension, GERD, mood disorder. Patient presented with complaints of nausea and vomiting. She was seen in the ER yesterday with complaints of progressively worsening right swelling with redness along with constitutional symptoms of fever and chills as well as generalized malaise. She has moved to a new house, and thinks she may have an unknown injury from cardboard piece. She has started noticing generalized malaise, fever, chills, nausea since Friday with some pain in her right foot. She noticed on Sunday that the right foot was red and swollen and therefore she came to the ER. She was given oral doxycycline but due to her persistent nausea after taking doxycycline she had an episode of vomiting twice, which reoccurred after taking another dose of doxycycline and therefore she came to the ER again. Also she mentions she has not been able to place any weight on her right foot. Denies any pain in her right knee. Denies any surgical procedures done, recent travel.  The patient is coming from home. And at her baseline independent for most of her ADL.  Review of Systems: as mentioned in the history of present illness.  A Comprehensive review of the other systems is negative.  Past Medical History  Diagnosis Date  . Hypertension   . High cholesterol   . Arthritis   . Acid reflux   . Retinal artery occlusion    Past Surgical History  Procedure Laterality Date  . Abdominal hysterectomy    . Knee surgery    . Toe surgery     Social History:  reports that she has never smoked. She has never used smokeless tobacco. She reports that she does not drink alcohol or use  illicit drugs.  Allergies  Allergen Reactions  . Erythromycin Itching  . Sulfa Antibiotics Anaphylaxis and Rash  . Azithromycin Other (See Comments)    Stomach cramps  . Levaquin [Levofloxacin] Nausea And Vomiting    Migraine.   . Penicillins Hives  . Vicodin [Hydrocodone-Acetaminophen] Nausea And Vomiting    History reviewed. No pertinent family history.  Prior to Admission medications   Medication Sig Start Date End Date Taking? Authorizing Provider  acetaminophen (TYLENOL) 500 MG tablet Take 1,000 mg by mouth every 6 (six) hours as needed for mild pain.    Yes Historical Provider, MD  atorvastatin (LIPITOR) 80 MG tablet Take 80 mg by mouth daily.   Yes Historical Provider, MD  cholecalciferol (VITAMIN D) 1000 UNITS tablet Take 4,000 Units by mouth daily.   Yes Historical Provider, MD  cyclobenzaprine (FLEXERIL) 10 MG tablet Take 10 mg by mouth 3 (three) times daily as needed for muscle spasms.   Yes Historical Provider, MD  estradiol (CLIMARA - DOSED IN MG/24 HR) 0.05 mg/24hr patch Place 0.05 mg onto the skin once a week. Change patch every Tuesday.   Yes Historical Provider, MD  FLUoxetine (PROZAC) 20 MG capsule Take 20 mg by mouth daily.   Yes Historical Provider, MD  ibuprofen (ADVIL,MOTRIN) 200 MG tablet Take 600 mg by mouth every 6 (six) hours as needed for mild pain.    Yes Historical Provider, MD  Misc Natural Products (TART CHERRY ADVANCED PO) Take 1 capsule by mouth daily at  12 noon.   Yes Historical Provider, MD  omeprazole (PRILOSEC) 20 MG capsule Take 20 mg by mouth daily.   Yes Historical Provider, MD  promethazine (PHENERGAN) 25 MG tablet Take 25 mg by mouth every 6 (six) hours as needed for nausea or vomiting.   Yes Historical Provider, MD  telmisartan (MICARDIS) 80 MG tablet Take 80 mg by mouth daily.   Yes Historical Provider, MD  traMADol (ULTRAM) 50 MG tablet Take 50 mg by mouth every 6 (six) hours as needed for moderate pain.    Yes Historical Provider, MD     Physical Exam: Filed Vitals:   10/20/13 1425 10/20/13 1825 10/20/13 2005 10/20/13 2139  BP: 116/59 123/58 141/65 132/53  Pulse: 103 91 87 84  Temp: 99.9 F (37.7 C) 99.4 F (37.4 C)  99.4 F (37.4 C)  TempSrc: Oral Oral  Oral  Resp: _0 Height:    _1  (1.676 m)  Weight:    106.5 kg (234 lb 12.6 oz)  SpO2: 97% 100% 100% 98%    General: Alert, Awake and Oriented to Time, Place and Person. Appear in mild distress Eyes: PERRL ENT: Oral Mucosa clear moist. Neck: no JVD Cardiovascular: S1 and S2 Present, no Murmur, Peripheral Pulses Present Respiratory: Bilateral Air entry equal and Decreased, Clear to Auscultation, noCrackles, no wheezes Abdomen: Bowel Sound Present, Soft and Non tender Skin: no Rash Extremities: Right leg Pedal edema, no calf tenderness, severe right with tenderness Right fourth toe faint bluish discolored spot with good capillary refill. No open sores or ulcers. Pulses palpable.  Neurologic: Grossly no focal neuro deficit.  Labs on Admission:  CBC:  Recent Labs Lab 10/19/13 1933 10/20/13 2038  WBC 12.4* 12.7*  NEUTROABS 10.6* 10.6*  HGB 12.9 13.7  HCT 39.1 41.2  MCV 87.7 88.2  PLT 219 266    CMP     Component Value Date/Time   NA 136* 10/20/2013 2038   K 4.0 10/20/2013 2038   CL 98 10/20/2013 2038   CO2 22 10/20/2013 2038   GLUCOSE 111* 10/20/2013 2038   BUN 19 10/20/2013 2038   CREATININE 1.06 10/20/2013 2038   CALCIUM 9.5 10/20/2013 2038   PROT 8.5* 10/20/2013 2038   ALBUMIN 3.5 10/20/2013 2038   AST 16 10/20/2013 2038   ALT 18 10/20/2013 2038   ALKPHOS 86 10/20/2013 2038   BILITOT 0.7 10/20/2013 2038   GFRNONAA 62* 10/20/2013 2038   GFRAA 71* 10/20/2013 2038    No results found for this basename: LIPASE, AMYLASE,  in the last 168 hours No results found for this basename: AMMONIA,  in the last 168 hours   Recent Labs Lab 10/20/13 2038  CKTOTAL 34   BNP (last 3 results) No results found for this basename: PROBNP,  in the last  8760 hours  Radiological Exams on Admission: Dg Ankle Right Port  10/20/2013   CLINICAL DATA:  Redness and swelling about the ankle without injury.  EXAM: PORTABLE RIGHT ANKLE - 2 VIEW  COMPARISON:  None.  FINDINGS: There is marked subcutaneous reticulation of the imaged lower extremity, particularly around the lateral ankle. There is no underlying fracture or malalignment. No evidence of osseous infection or joint effusion. No radiopaque foreign body or subcutaneous emphysema.  IMPRESSION: Diffuse soft tissue swelling. No subcutaneous gas or acute osseous finding.   Electronically Signed   By: Jorje Guild M.D.   On: 10/20/2013 22:20    Assessment/Plan Principal Problem:   Cellulitis of right foot  Active Problems:   Mood disorder   GERD (gastroesophageal reflux disease)   Nausea with vomiting   1. Cellulitis of right foot The patient is presenting with complaints of right foot cellulitis and nausea and vomiting She is unable to keep her antibiotics and the stomach due to nausea and therefore she will be admitted in the hospital. I would treat her with IV vancomycin due to progressive nature of her cellulitis. I would check x-ray of her foot. Check CPK as well as ESR and CRP. If there is any abnormality will discuss with orthopedics. Pain management with oral Percocet and IV Dilaudid since other methods have not been successful.  2. Nausea and vomiting. Likely due to severe pain and infection. Used IV Zofran as needed IV Pepcid.  3. Leg swelling. DVT Doppler is negative, performed yesterday. Check CPK, hold Lipitor  4. Mood disorder Continue Prozac.  DVT Prophylaxis: subcutaneous Heparin Nutrition: Regular diet,   Code Status: Full  Disposition: Admitted to inpatient in med-surge unit.  Author: Berle Mull, MD [facility] Pager: [phone] 10/20/2013, 11:11 PM    If 7PM-7AM, please contact night-coverage [email or web address] Password [email or web address]  **Disclaimer: This  note may have been dictated with voice recognition software. Similar sounding words can inadvertently be transcribed and this note may contain transcription errors which may not have been corrected upon publication of note.**

## 2013-10-20 NOTE — ED Notes (Signed)
Patient c/o right foot swelling and is now unable to move the toes on the right foot.

## 2013-10-20 NOTE — Progress Notes (Signed)
ANTIBIOTIC CONSULT NOTE - INITIAL  Pharmacy Consult for Vancomycin Indication: Cellulitis  Allergies  Allergen Reactions  . Erythromycin Itching  . Sulfa Antibiotics Anaphylaxis and Rash  . Azithromycin Other (See Comments)    Stomach cramps  . Levaquin [Levofloxacin] Nausea And Vomiting    Migraine.   . Penicillins Hives  . Vicodin [Hydrocodone-Acetaminophen] Nausea And Vomiting    Patient Measurements: Height:  (167.6 cm) Weight: 234 lb 12.6 oz (106.5 kg) IBW/kg (Calculated) : 59.3 Adjusted Body Weight: 73.5 kg  Vital Signs: Temp: 99.4 F (37.4 C) (09/21 2139) Temp src: Oral (09/21 2139) BP: 132/53 mmHg (09/21 2139) Pulse Rate: 84 (09/21 2139) Intake/Output from previous day:   Intake/Output from this shift:    Labs:  Recent Labs  10/19/13 1933 10/20/13 2038  WBC 12.4* 12.7*  HGB 12.9 13.7  PLT 219 266  CREATININE 1.12* 1.06   Estimated Creatinine Clearance: 81 ml/min (by C-G formula based on Cr of 1.06). No results found for this basename: VANCOTROUGH, VANCOPEAK, VANCORANDOM, GENTTROUGH, GENTPEAK, GENTRANDOM, TOBRATROUGH, TOBRAPEAK, TOBRARND, AMIKACINPEAK, AMIKACINTROU, AMIKACIN,  in the last 72 hours   Microbiology: No results found for this or any previous visit (from the past 720 hour(s)).  Medical History: Past Medical History  Diagnosis Date  . Hypertension   . High cholesterol   . Arthritis   . Acid reflux   . Retinal artery occlusion     Medications:  Prescriptions prior to admission  Medication Sig Dispense Refill  . acetaminophen (TYLENOL) 500 MG tablet Take 1,000 mg by mouth every 6 (six) hours as needed for mild pain.       Marland Kitchen atorvastatin (LIPITOR) 80 MG tablet Take 80 mg by mouth daily.      . cholecalciferol (VITAMIN D) 1000 UNITS tablet Take 4,000 Units by mouth daily.      . cyclobenzaprine (FLEXERIL) 10 MG tablet Take 10 mg by mouth 3 (three) times daily as needed for muscle spasms.      Marland Kitchen estradiol (CLIMARA - DOSED IN MG/24  HR) 0.05 mg/24hr patch Place 0.05 mg onto the skin once a week. Change patch every Tuesday.      Marland Kitchen FLUoxetine (PROZAC) 20 MG capsule Take 20 mg by mouth daily.      Marland Kitchen ibuprofen (ADVIL,MOTRIN) 200 MG tablet Take 600 mg by mouth every 6 (six) hours as needed for mild pain.       . Misc Natural Products (TART CHERRY ADVANCED PO) Take 1 capsule by mouth daily at 12 noon.      Marland Kitchen omeprazole (PRILOSEC) 20 MG capsule Take 20 mg by mouth daily.      . promethazine (PHENERGAN) 25 MG tablet Take 25 mg by mouth every 6 (six) hours as needed for nausea or vomiting.      Marland Kitchen telmisartan (MICARDIS) 80 MG tablet Take 80 mg by mouth daily.      . traMADol (ULTRAM) 50 MG tablet Take 50 mg by mouth every 6 (six) hours as needed for moderate pain.        Anti-infectives   Start     Dose/Rate Route Frequency Ordered Stop   10/20/13 2100  vancomycin (VANCOCIN) 2,000 mg in sodium chloride 0.9 % 500 mL IVPB     2,000 mg 250 mL/hr over 120 Minutes Intravenous  Once 10/20/13 2000       Assessment: 48yo F with RLE cellulitis. Recently prescribed doxycycline but unable to keep it down d/t nausea and vomiting. Pharmacy is asked to dose Vancomycin.  9/21 >> Vanc >>  Tmax: 99.9 WBCs: elevated Renal: wnl, CrCl 81CG  Goal of Therapy:  Vancomycin trough level 15-20 mcg/ml  Plan:   After 2g in the ED, start Vancomycin 1g IV q12h.  Measure Vanc trough at steady state.  Follow up renal fxn and culture results.  Charolotte Eke, PharmD, pager [phone]. 10/20/2013,9:50 PM.

## 2013-10-21 DIAGNOSIS — K219 Gastro-esophageal reflux disease without esophagitis: Secondary | ICD-10-CM | POA: Diagnosis present

## 2013-10-21 DIAGNOSIS — F39 Unspecified mood [affective] disorder: Secondary | ICD-10-CM | POA: Diagnosis present

## 2013-10-21 DIAGNOSIS — L02419 Cutaneous abscess of limb, unspecified: Secondary | ICD-10-CM

## 2013-10-21 DIAGNOSIS — R112 Nausea with vomiting, unspecified: Secondary | ICD-10-CM

## 2013-10-21 DIAGNOSIS — L03119 Cellulitis of unspecified part of limb: Secondary | ICD-10-CM

## 2013-10-21 DIAGNOSIS — E876 Hypokalemia: Secondary | ICD-10-CM | POA: Diagnosis present

## 2013-10-21 LAB — URINALYSIS, ROUTINE W REFLEX MICROSCOPIC
Bilirubin Urine: NEGATIVE
GLUCOSE, UA: NEGATIVE mg/dL
Hgb urine dipstick: NEGATIVE
KETONES UR: NEGATIVE mg/dL
Leukocytes, UA: NEGATIVE
NITRITE: NEGATIVE
PH: 5.5 (ref 5.0–8.0)
PROTEIN: NEGATIVE mg/dL
Specific Gravity, Urine: 1.026 (ref 1.005–1.030)
Urobilinogen, UA: 1 mg/dL (ref 0.0–1.0)

## 2013-10-21 LAB — SEDIMENTATION RATE: Sed Rate: 87 mm/hr — ABNORMAL HIGH (ref 0–22)

## 2013-10-21 LAB — C-REACTIVE PROTEIN: CRP: 24.7 mg/dL — ABNORMAL HIGH (ref <0.60)

## 2013-10-21 MED ORDER — OXYCODONE-ACETAMINOPHEN 5-325 MG PO TABS
1.0000 | ORAL_TABLET | Freq: Four times a day (QID) | ORAL | Status: DC | PRN
  Administered 2013-10-21 – 2013-10-22 (×3): 1 via ORAL
  Filled 2013-10-21 (×3): qty 1

## 2013-10-21 MED ORDER — OXYCODONE-ACETAMINOPHEN 5-325 MG PO TABS
1.0000 | ORAL_TABLET | Freq: Once | ORAL | Status: AC
  Administered 2013-10-21: 1 via ORAL
  Filled 2013-10-21: qty 1

## 2013-10-21 NOTE — Care Management Note (Addendum)
    Page 1 of 1   10/24/2013     3:41:39 PM CARE MANAGEMENT NOTE 10/24/2013  Patient:  Charlotte Fox, Charlotte Fox   Account Number:  [id number]  Date Initiated:  10/21/2013  Documentation initiated by:  Lanier Clam  Subjective/Objective Assessment:   48 Y/O F ADMITTED W/RLE CELLULITIS.     Action/Plan:   FROM HOME.   Anticipated DC Date:  10/24/2013   Anticipated DC Plan:  HOME/SELF CARE      DC Planning Services  CM consult      Choice offered to / List presented to:             Status of service:  Completed, signed off Medicare Important Message given?   (If response is "NO", the following Medicare IM given date fields will be blank) Date Medicare IM given:   Medicare IM given by:   Date Additional Medicare IM given:   Additional Medicare IM given by:    Discharge Disposition:  HOME/SELF CARE  Per UR Regulation:  Reviewed for med. necessity/level of care/duration of stay  If discussed at Long Length of Stay Meetings, dates discussed:    Comments:  10/24/13 Malonie Tatum RN,BSN NCM [phone] D/C HOME NO NEEDS OR ORDERS.  10/21/13 Anthem Frazer RN,BSN NCM [phone] NO ANTICIPATED D/C NEEDS.

## 2013-10-21 NOTE — Progress Notes (Addendum)
PROGRESS NOTE    Charlotte Fox ZOX:[id number] DOB: February 10, 1965 DOA: 10/20/2013 PCP: Gweneth Dimitri, MD  HPI/Brief narrative 48 year old female with history of hypertension, GERD, mood disorder, presented to the ED with complaints of nausea, vomiting, progressively worsening swelling, pain and redness of right foot/leg with subjective fevers and chills. She was seen in ED and given oral doxycycline but due to persistent nausea and 2 episodes of vomiting, she returned to ED.  Assessment/Plan:  1. Cellulitis of the right leg/foot: Did not tolerate by mouth antibiotics. Started empirically on IV vancomycin. X-rays negative for acute findings. Elevated limb. Improving. No open wounds. Venous Doppler negative for DVT.  2. Nausea and vomiting: Unclear etiology but resolved. Diet as tolerated. 3. Hypertension: Controlled-has not taken home meds for a few days. Monitor off medications. 4. Hypokalemia: Replaced 5. History of mood disorder: Continue home medications    Code Status: Full Family Communication: None at bedside. Disposition Plan: Home when medically stable.   Consultants:  Othropedics  Procedures:  None  Antibiotics:  IV vancomycin 9/21 >   Subjective: decreasing swelling, pain and redness of right leg and foot.   Objective: Filed Vitals:   10/21/13 1344 10/21/13 2159 10/22/13 0516 10/22/13 1351  BP: 118/56 127/60 127/91 114/54  Pulse: 80 83 72 80  Temp: 98 F (36.7 C) 98.5 F (36.9 C) 98.1 F (36.7 C) 98.4 F (36.9 C)  TempSrc: Oral Oral Oral Oral  Resp: Height:      Weight:   110.2 kg (242 lb 15.2 oz)   SpO2: 97% 100% 100% 100%    Intake/Output Summary (Last 24 hours) at 10/22/13 1459 Last data filed at 10/22/13 1320  Gross per 24 hour  Intake   1120 ml  Output   1350 ml  Net   -230 ml   Filed Weights   10/20/13 2139 10/21/13 0852 10/22/13 0516  Weight: 106.5 kg (234 lb 12.6 oz) 106.5 kg (234 lb 12.6 oz) 110.2 kg (242 lb 15.2 oz)       Exam:  General exam: Moderately built and morbidly obese female patient sitting up comfortably in bed. Respiratory system: Clear. No increased work of breathing. Cardiovascular system: S1 & S2 heard, RRR. No JVD, murmurs, gallops, clicks or pedal edema. Gastrointestinal system: Abdomen is nondistended, soft and nontender. Normal bowel sounds heard. Central nervous system: Alert and oriented. No focal neurological deficits. Extremities: Symmetric 5 x 5 power. Right leg with moderate swelling below mid leg to toes. Mild patchy erythema lower anterior leg and toes. No open wounds. Mild warmth and tenderness. No crepitus or drainage. All findings improving- now signs much localized to 4th toe- diffusely red, and blister/abscess on either side of tip and exquisitely tender.    Data Reviewed: Basic Metabolic Panel:  Recent Labs Lab 10/19/13 1933 10/20/13 2038  NA 137 136*  K 3.3* 4.0  CL 100 98  CO2 24 22  GLUCOSE 134* 111*  BUN 20 19  CREATININE 1.12* 1.06  CALCIUM 9.1 9.5   Liver Function Tests:  Recent Labs Lab 10/20/13 2038  AST 16  ALT 18  ALKPHOS 86  BILITOT 0.7  PROT 8.5*  ALBUMIN 3.5   No results found for this basename: LIPASE, AMYLASE,  in the last 168 hours No results found for this basename: AMMONIA,  in the last 168 hours CBC:  Recent Labs Lab 10/19/13 1933 10/20/13 2038 10/22/13 0455  WBC 12.4* 12.7* 7.3  NEUTROABS 10.6* 10.6*  --  HGB 12.9 13.7 11.2*  HCT 39.1 41.2 35.6*  MCV 87.7 88.2 89.7  PLT 219 266 246   Cardiac Enzymes:  Recent Labs Lab 10/20/13 2038  CKTOTAL 34   BNP (last 3 results) No results found for this basename: PROBNP,  in the last 8760 hours CBG: No results found for this basename: GLUCAP,  in the last 168 hours  No results found for this or any previous visit (from the past 240 hour(s)).     Studies: Dg Ankle Right Port  10/20/2013   CLINICAL DATA:  Redness and swelling about the ankle without injury.  EXAM:  PORTABLE RIGHT ANKLE - 2 VIEW  COMPARISON:  None.  FINDINGS: There is marked subcutaneous reticulation of the imaged lower extremity, particularly around the lateral ankle. There is no underlying fracture or malalignment. No evidence of osseous infection or joint effusion. No radiopaque foreign body or subcutaneous emphysema.  IMPRESSION: Diffuse soft tissue swelling. No subcutaneous gas or acute osseous finding.   Electronically Signed   By: Tiburcio Pea M.D.   On: 10/20/2013 22:20        Scheduled Meds: . enoxaparin (LOVENOX) injection  50 mg Subcutaneous Q24H  . estradiol  0.05 mg Transdermal Weekly  . famotidine (PEPCID) IV  20 mg Intravenous Q12H  . FLUoxetine  20 mg Oral Daily  . vancomycin  1,000 mg Intravenous Q12H   Continuous Infusions:   Principal Problem:   Cellulitis of right foot Active Problems:   Mood disorder   GERD (gastroesophageal reflux disease)   Nausea with vomiting   Hypokalemia    Time spent: 25 minutes.    Marcellus Scott, MD, FACP, FHM. [facility] Pager [phone]  If 7PM-7AM, please contact night-coverage [email or web address] Password [email or web address] 10/22/2013, 2:59 PM    LOS: 2 days

## 2013-10-22 ENCOUNTER — Inpatient Hospital Stay (HOSPITAL_COMMUNITY): Payer: 59

## 2013-10-22 DIAGNOSIS — I10 Essential (primary) hypertension: Secondary | ICD-10-CM

## 2013-10-22 LAB — CBC
HEMATOCRIT: 35.6 % — AB (ref 36.0–46.0)
HEMOGLOBIN: 11.2 g/dL — AB (ref 12.0–15.0)
MCH: 28.2 pg (ref 26.0–34.0)
MCHC: 31.5 g/dL (ref 30.0–36.0)
MCV: 89.7 fL (ref 78.0–100.0)
Platelets: 246 10*3/uL (ref 150–400)
RBC: 3.97 MIL/uL (ref 3.87–5.11)
RDW: 14 % (ref 11.5–15.5)
WBC: 7.3 10*3/uL (ref 4.0–10.5)

## 2013-10-22 LAB — VANCOMYCIN, TROUGH: Vancomycin Tr: 11.5 ug/mL (ref 10.0–20.0)

## 2013-10-22 NOTE — Progress Notes (Signed)
ANTIBIOTIC CONSULT NOTE - follow up  Pharmacy Consult for Vancomycin Indication: Cellulitis  Allergies  Allergen Reactions  . Erythromycin Itching  . Sulfa Antibiotics Anaphylaxis and Rash  . Azithromycin Other (See Comments)    Stomach cramps  . Levaquin [Levofloxacin] Nausea And Vomiting    Migraine.   . Penicillins Hives  . Vicodin [Hydrocodone-Acetaminophen] Nausea And Vomiting    Patient Measurements: Height:  (167.6 cm) Weight: 242 lb 15.2 oz (110.2 kg) IBW/kg (Calculated) : 59.3 Adjusted Body Weight: 73.5 kg  Vital Signs: Temp: 98.1 Fox (36.7 C) (09/23 0516) Temp src: Oral (09/23 0516) BP: 127/91 mmHg (09/23 0516) Pulse Rate: 72 (09/23 0516) Intake/Output from previous day: 09/22 0701 - 09/23 0700 In: 1360 [P.O.:960; IV Piggyback:400] Out: 1850 [Urine:1850] Intake/Output from this shift:    Labs:  Recent Labs  10/19/13 1933 10/20/13 2038 10/22/13 0455  WBC 12.4* 12.7* 7.3  HGB 12.9 13.7 11.2*  PLT 219 266 246  CREATININE 1.12* 1.06  --    Estimated Creatinine Clearance: 82.6 ml/min (by C-G formula based on Cr of 1.06). No results found for this basename: VANCOTROUGH, VANCOPEAK, VANCORANDOM, GENTTROUGH, GENTPEAK, GENTRANDOM, TOBRATROUGH, TOBRAPEAK, TOBRARND, AMIKACINPEAK, AMIKACINTROU, AMIKACIN,  in the last 72 hours   Microbiology: No results found for this or any previous visit (from the past 720 hour(s)).  Medical History: Past Medical History  Diagnosis Date  . Hypertension   . High cholesterol   . Arthritis   . Acid reflux   . Retinal artery occlusion     Medications:  Prescriptions prior to admission  Medication Sig Dispense Refill  . acetaminophen (TYLENOL) 500 MG tablet Take 1,000 mg by mouth every 6 (six) hours as needed for mild pain.       Marland Kitchen atorvastatin (LIPITOR) 80 MG tablet Take 80 mg by mouth daily.      . cholecalciferol (VITAMIN D) 1000 UNITS tablet Take 4,000 Units by mouth daily.      . cyclobenzaprine (FLEXERIL) 10 MG  tablet Take 10 mg by mouth 3 (three) times daily as needed for muscle spasms.      Marland Kitchen estradiol (CLIMARA - DOSED IN MG/24 HR) 0.05 mg/24hr patch Place 0.05 mg onto the skin once a week. Change patch every Tuesday.      Marland Kitchen FLUoxetine (PROZAC) 20 MG capsule Take 20 mg by mouth daily.      Marland Kitchen ibuprofen (ADVIL,MOTRIN) 200 MG tablet Take 600 mg by mouth every 6 (six) hours as needed for mild pain.       . Misc Natural Products (TART CHERRY ADVANCED PO) Take 1 capsule by mouth daily at 12 noon.      Marland Kitchen omeprazole (PRILOSEC) 20 MG capsule Take 20 mg by mouth daily.      . promethazine (PHENERGAN) 25 MG tablet Take 25 mg by mouth every 6 (six) hours as needed for nausea or vomiting.      Marland Kitchen telmisartan (MICARDIS) 80 MG tablet Take 80 mg by mouth daily.      . traMADol (ULTRAM) 50 MG tablet Take 50 mg by mouth every 6 (six) hours as needed for moderate pain.        Anti-infectives   Start     Dose/Rate Route Frequency Ordered Stop   10/21/13 1000  vancomycin (VANCOCIN) IVPB 1000 mg/200 mL premix     1,000 mg 200 mL/hr over 60 Minutes Intravenous Every 12 hours 10/20/13 2151     10/20/13 2100  vancomycin (VANCOCIN) 2,000 mg in sodium chloride 0.9 %  500 mL IVPB     2,000 mg 250 mL/hr over 120 Minutes Intravenous  Once 10/20/13 2000 10/21/13 0048     Assessment: Charlotte Fox with RLE cellulitis. Recently prescribed doxycycline but unable to keep it down d/t nausea and vomiting. Pharmacy is asked to dose Vancomycin.   9/21 >> Vanc >>  Tmax: afebrile WBCs: improved to WNL Renal: wnl, CrCl 81CG  Goal of Therapy:  Vanc Trough 10-15 mcg/mL  Plan:  1) Continue vancomycin 1g q12 for now 2) Will check a trough prior to dose tonight which will be prior to 5th dose   Hessie Knows, PharmD, BCPS Pager [phone] 10/22/2013 8:40 AM

## 2013-10-22 NOTE — Consult Note (Signed)
Charlotte Fox is an 48 y.o. female.    Chief Complaint: right foot and leg swelling and erythema  HPI: 48 y/o female c/o right foot/leg swelling and reddness for 4 days. Seen in emergency department for cellulitis of right leg, placed on doxycycline but no improvement. Returned 2 days later with worsening symptoms and admitted for IV antibiotics, vancomycin. Overall symptoms has improved mildly. C/o continued pain and edema/erythema to right 4th toe extending into dorsal foot. Previous surgery to right foot many years ago. Denies any falls or injuries. Otherwise doing fairly well  PCP:  MCNEILL,WENDY, MD  PMH: Past Medical History  Diagnosis Date  . Hypertension   . High cholesterol   . Arthritis   . Acid reflux   . Retinal artery occlusion     PSH: Past Surgical History  Procedure Laterality Date  . Abdominal hysterectomy    . Knee surgery    . Toe surgery      Social History:  reports that she has never smoked. She has never used smokeless tobacco. She reports that she does not drink alcohol or use illicit drugs.  Allergies:  Allergies  Allergen Reactions  . Erythromycin Itching  . Sulfa Antibiotics Anaphylaxis and Rash  . Azithromycin Other (See Comments)    Stomach cramps  . Levaquin [Levofloxacin] Nausea And Vomiting    Migraine.   . Penicillins Hives  . Vicodin [Hydrocodone-Acetaminophen] Nausea And Vomiting    Medications: Current Facility-Administered Medications  Medication Dose Route Frequency Provider Last Rate Last Dose  . acetaminophen (TYLENOL) tablet 650 mg  650 mg Oral Q6H PRN Berle Mull, MD       Or  . acetaminophen (TYLENOL) suppository 650 mg  650 mg Rectal Q6H PRN Berle Mull, MD      . cyclobenzaprine (FLEXERIL) tablet 10 mg  10 mg Oral TID PRN Berle Mull, MD      . enoxaparin (LOVENOX) injection 50 mg  50 mg Subcutaneous Q24H Berle Mull, MD   50 mg at 10/21/13 2216  . estradiol (CLIMARA - Dosed in mg/24 hr) patch 0.05 mg  0.05 mg  Transdermal Weekly Berle Mull, MD   0.05 mg at 10/21/13 0955  . famotidine (PEPCID) IVPB 20 mg  20 mg Intravenous Q12H Berle Mull, MD   20 mg at 10/22/13 1052  . FLUoxetine (PROZAC) capsule 20 mg  20 mg Oral Daily Berle Mull, MD   20 mg at 10/22/13 1052  . HYDROmorphone (DILAUDID) injection 0.5 mg  0.5 mg Intravenous Q3H PRN Berle Mull, MD   0.5 mg at 10/21/13 0956  . ondansetron (ZOFRAN) tablet 4 mg  4 mg Oral Q6H PRN Berle Mull, MD       Or  . ondansetron (ZOFRAN) injection 4 mg  4 mg Intravenous Q6H PRN Berle Mull, MD      . oxyCODONE-acetaminophen (PERCOCET/ROXICET) 5-325 MG per tablet 1 tablet  1 tablet Oral Q6H PRN Modena Jansky, MD   1 tablet at 10/22/13 1101  . vancomycin (VANCOCIN) IVPB 1000 mg/200 mL premix  1,000 mg Intravenous Q12H Berle Mull, MD   1,000 mg at 10/22/13 1052    Results for orders placed during the hospital encounter of 10/20/13 (from the past 48 hour(s))  COMPREHENSIVE METABOLIC PANEL     Status: Abnormal   Collection Time    10/20/13  8:38 PM      Result Value Ref Range   Sodium 136 (*) 137 - 147 mEq/L   Potassium 4.0  3.7 -  5.3 mEq/L   Comment: DELTA CHECK NOTED     REPEATED TO VERIFY     NO VISIBLE HEMOLYSIS   Chloride 98  96 - 112 mEq/L   CO2 22  19 - 32 mEq/L   Glucose, Bld 111 (*) 70 - 99 mg/dL   BUN 19  6 - 23 mg/dL   Creatinine, Ser 1.06  0.50 - 1.10 mg/dL   Calcium 9.5  8.4 - 10.5 mg/dL   Total Protein 8.5 (*) 6.0 - 8.3 g/dL   Albumin 3.5  3.5 - 5.2 g/dL   AST 16  0 - 37 U/L   ALT 18  0 - 35 U/L   Alkaline Phosphatase 86  39 - 117 U/L   Total Bilirubin 0.7  0.3 - 1.2 mg/dL   GFR calc non Af Amer 62 (*) >90 mL/min   GFR calc Af Amer 71 (*) >90 mL/min   Comment: (NOTE)     The eGFR has been calculated using the CKD EPI equation.     This calculation has not been validated in all clinical situations.     eGFR's persistently <90 mL/min signify possible Chronic Kidney     Disease.   Anion gap 16 (*) 5 - 15  SEDIMENTATION RATE      Status: Abnormal   Collection Time    10/20/13  8:38 PM      Result Value Ref Range   Sed Rate 87 (*) 0 - 22 mm/hr  C-REACTIVE PROTEIN     Status: Abnormal   Collection Time    10/20/13  8:38 PM      Result Value Ref Range   CRP 24.7 (*) <0.60 mg/dL   Comment: Performed at [facility]  CBC WITH DIFFERENTIAL     Status: Abnormal   Collection Time    10/20/13  8:38 PM      Result Value Ref Range   WBC 12.7 (*) 4.0 - 10.5 K/uL   RBC 4.67  3.87 - 5.11 MIL/uL   Hemoglobin 13.7  12.0 - 15.0 g/dL   HCT 41.2  36.0 - 46.0 %   MCV 88.2  78.0 - 100.0 fL   MCH 29.3  26.0 - 34.0 pg   MCHC 33.3  30.0 - 36.0 g/dL   RDW 14.3  11.5 - 15.5 %   Platelets 266  150 - 400 K/uL   Neutrophils Relative % 83 (*) 43 - 77 %   Neutro Abs 10.6 (*) 1.7 - 7.7 K/uL   Lymphocytes Relative 10 (*) 12 - 46 %   Lymphs Abs 1.3  0.7 - 4.0 K/uL   Monocytes Relative 7  3 - 12 %   Monocytes Absolute 0.8  0.1 - 1.0 K/uL   Eosinophils Relative 0  0 - 5 %   Eosinophils Absolute 0.0  0.0 - 0.7 K/uL   Basophils Relative 0  0 - 1 %   Basophils Absolute 0.0  0.0 - 0.1 K/uL  CK     Status: None   Collection Time    10/20/13  8:38 PM      Result Value Ref Range   Total CK 34  7 - 177 U/L  URINALYSIS, ROUTINE W REFLEX MICROSCOPIC     Status: Abnormal   Collection Time    10/21/13 10:21 AM      Result Value Ref Range   Color, Urine AMBER (*) YELLOW   Comment: BIOCHEMICALS MAY BE AFFECTED BY COLOR   APPearance CLOUDY (*)  CLEAR   Specific Gravity, Urine 1.026  1.005 - 1.030   pH 5.5  5.0 - 8.0   Glucose, UA NEGATIVE  NEGATIVE mg/dL   Hgb urine dipstick NEGATIVE  NEGATIVE   Bilirubin Urine NEGATIVE  NEGATIVE   Ketones, ur NEGATIVE  NEGATIVE mg/dL   Protein, ur NEGATIVE  NEGATIVE mg/dL   Urobilinogen, UA 1.0  0.0 - 1.0 mg/dL   Nitrite NEGATIVE  NEGATIVE   Leukocytes, UA NEGATIVE  NEGATIVE   Comment: MICROSCOPIC NOT DONE ON URINES WITH NEGATIVE PROTEIN, BLOOD, LEUKOCYTES, NITRITE, OR GLUCOSE <1000 mg/dL.    CBC     Status: Abnormal   Collection Time    10/22/13  4:55 AM      Result Value Ref Range   WBC 7.3  4.0 - 10.5 K/uL   RBC 3.97  3.87 - 5.11 MIL/uL   Hemoglobin 11.2 (*) 12.0 - 15.0 g/dL   Comment: DELTA CHECK NOTED     REPEATED TO VERIFY   HCT 35.6 (*) 36.0 - 46.0 %   MCV 89.7  78.0 - 100.0 fL   MCH 28.2  26.0 - 34.0 pg   MCHC 31.5  30.0 - 36.0 g/dL   RDW 14.0  11.5 - 15.5 %   Platelets 246  150 - 400 K/uL   Dg Ankle Right Port  10/20/2013   CLINICAL DATA:  Redness and swelling about the ankle without injury.  EXAM: PORTABLE RIGHT ANKLE - 2 VIEW  COMPARISON:  None.  FINDINGS: There is marked subcutaneous reticulation of the imaged lower extremity, particularly around the lateral ankle. There is no underlying fracture or malalignment. No evidence of osseous infection or joint effusion. No radiopaque foreign body or subcutaneous emphysema.  IMPRESSION: Diffuse soft tissue swelling. No subcutaneous gas or acute osseous finding.   Electronically Signed   By: Jorje Guild M.D.   On: 10/20/2013 22:20    ROS: ROS Pain with palpation, weight bearing and movement of right foot  Physical Exam: Alert and oriented 48 y/o female in no acute distress Mild edema to foot and lower leg as compared to left leg 4th toe with moderate erythema and lateral aspects of the toe with some dusky skin with possible early necrosis to distal end of the toe nv intact but very guarded with rom due to pain Improved erythema to lower extremity as reported by the patient Essentially non weight bearing right lower leg due to pain Physical Exam   Assessment/Plan Assessment: right leg cellulitis with possible osteomyelitis to 4th toe  Plan: Plan for urgent MRI to r/o osteomyelitis of 4th toe Briefly discussed outcome if scan is positive for osteo Labs are strongly positive for infection Continue IV antibiotics and will continue to monitor her status after scan Pain control as needed Recommend limited to  no weight bearing right lower extremity

## 2013-10-22 NOTE — ED Provider Notes (Signed)
Medical screening examination/treatment/procedure(s) were conducted as a shared visit with non-physician practitioner(s) and myself.  I personally evaluated the patient during the encounter.   EKG Interpretation None        Candyce Churn III, MD 10/22/13 [phone]

## 2013-10-23 LAB — CBC
HEMATOCRIT: 36.5 % (ref 36.0–46.0)
Hemoglobin: 11.9 g/dL — ABNORMAL LOW (ref 12.0–15.0)
MCH: 28.5 pg (ref 26.0–34.0)
MCHC: 32.6 g/dL (ref 30.0–36.0)
MCV: 87.5 fL (ref 78.0–100.0)
PLATELETS: 313 10*3/uL (ref 150–400)
RBC: 4.17 MIL/uL (ref 3.87–5.11)
RDW: 13.6 % (ref 11.5–15.5)
WBC: 8.8 10*3/uL (ref 4.0–10.5)

## 2013-10-23 MED ORDER — FAMOTIDINE 20 MG PO TABS
20.0000 mg | ORAL_TABLET | Freq: Two times a day (BID) | ORAL | Status: DC
  Administered 2013-10-23 – 2013-10-24 (×2): 20 mg via ORAL
  Filled 2013-10-23 (×4): qty 1

## 2013-10-23 MED ORDER — VANCOMYCIN HCL IN DEXTROSE 1-5 GM/200ML-% IV SOLN
1000.0000 mg | Freq: Two times a day (BID) | INTRAVENOUS | Status: DC
  Administered 2013-10-23: 1000 mg via INTRAVENOUS
  Filled 2013-10-23 (×2): qty 200

## 2013-10-23 MED ORDER — IBUPROFEN 200 MG PO TABS
600.0000 mg | ORAL_TABLET | Freq: Four times a day (QID) | ORAL | Status: DC | PRN
  Administered 2013-10-23: 600 mg via ORAL
  Filled 2013-10-23: qty 3

## 2013-10-23 NOTE — Progress Notes (Signed)
  Orthopedics Progress Note  Subjective: It does feel better  Objective:  Filed Vitals:   10/23/13 0447  BP: 144/83  Pulse: 76  Temp: 98.5 F (36.9 C)  Resp: 18    General: Awake and alert  Musculoskeletal: right foot still with moderate edema and erythema and some patchy streaking, toes 2-4 with evidence of persistent infection/cellulitis, some blisters forming on the 4th toe. No fluctuance noted in the forefoot.  She is a little less tender around the toes today. Neurovascularly intact  Lab Results  Component Value Date   WBC 8.8 10/23/2013   HGB 11.9* 10/23/2013   HCT 36.5 10/23/2013   MCV 87.5 10/23/2013   PLT 313 10/23/2013       Component Value Date/Time   NA 136* 10/20/2013 2038   K 4.0 10/20/2013 2038   CL 98 10/20/2013 2038   CO2 22 10/20/2013 2038   GLUCOSE 111* 10/20/2013 2038   BUN 19 10/20/2013 2038   CREATININE 1.06 10/20/2013 2038   CALCIUM 9.5 10/20/2013 2038   GFRNONAA 62* 10/20/2013 2038   GFRAA 71* 10/20/2013 2038    No results found for this basename: INR, PROTIME    Assessment/Plan: Patient with persistent cellulitis of her right foot despite 3 days of IV Vancomycin. No evidence of abscess or septic arthritis at this time.  Would continue with current management - elevation, bed rest, and Vancomycin.  Consider expanding antibiotic coverage with Clindamycin given the very slow clinical progress. Will follow.  Almedia Balls. Ranell Patrick, MD 10/23/2013 7:37 AM

## 2013-10-23 NOTE — Progress Notes (Signed)
PROGRESS NOTE   Patient was interviewed and examined on 10/22/13. However instead of creating a new note, in error addendum was made to note of 9/22.  Charlotte Fox ZOX:[id number] DOB: 11/27/1965 DOA: 10/20/2013 PCP: Gweneth Dimitri, MD  HPI/Brief narrative 48 year old female with history of hypertension, GERD, mood disorder, presented to the ED with complaints of nausea, vomiting, progressively worsening swelling, pain and redness of right foot/leg with subjective fevers and chills. She was seen in ED and given oral doxycycline but due to persistent nausea and 2 episodes of vomiting, she returned to ED.  Assessment/Plan:  1. Cellulitis of the right leg/foot: Did not tolerate by mouth antibiotics. Started empirically on IV vancomycin. X-rays negative for acute findings. Elevated limb. Improving. No open wounds. Venous Doppler negative for DVT. Findings suspicious for ? Tiny abscess on tip of 4th toe- Ortho consulted. ABI requested per their advise. 2. Nausea and vomiting: Unclear etiology but resolved. Diet as tolerated. 3. Hypertension: Controlled-has not taken home meds for a few days. Monitor off medications. 4. Hypokalemia: Replaced 5. History of mood disorder: Continue home medications 6. Anemia: ? Dilutional- FU CBC in AM.   Code Status: Full Family Communication: None at bedside. Disposition Plan: Home when medically stable.   Consultants:  Othropedics  Procedures:  None  Antibiotics:  IV vancomycin 9/21 >   Subjective: decreasing swelling, pain and redness of right leg and foot.   Objective: Filed Vitals:   10/22/13 0516 10/22/13 1351 10/22/13 2148 10/23/13 0447  BP: 127/91 114/54 143/74 144/83  Pulse: 72 80 85 76  Temp: 98.1 F (36.7 C) 98.4 F (36.9 C) 99.2 F (37.3 C) 98.5 F (36.9 C)  TempSrc: Oral Oral Oral Oral  Resp: Height:      Weight: 110.2 kg (242 lb 15.2 oz)     SpO2: 100% 100% 100% 100%    Intake/Output Summary (Last 24  hours) at 10/23/13 1335 Last data filed at 10/23/13 1109  Gross per 24 hour  Intake    600 ml  Output   1925 ml  Net  -1325 ml   Filed Weights   10/20/13 2139 10/21/13 0852 10/22/13 0516  Weight: 106.5 kg (234 lb 12.6 oz) 106.5 kg (234 lb 12.6 oz) 110.2 kg (242 lb 15.2 oz)     Exam:  General exam: Moderately built and morbidly obese female patient sitting up comfortably in bed. Respiratory system: Clear. No increased work of breathing. Cardiovascular system: S1 & S2 heard, RRR. No JVD, murmurs, gallops, clicks or pedal edema. Gastrointestinal system: Abdomen is nondistended, soft and nontender. Normal bowel sounds heard. Central nervous system: Alert and oriented. No focal neurological deficits. Extremities: Symmetric 5 x 5 power. Right leg with moderate swelling below mid leg to toes. Mild patchy erythema lower anterior leg and toes. No open wounds. Mild warmth and tenderness. No crepitus or drainage. All findings improving- now signs much localized to 4th toe- diffusely red, and blister/abscess on either side of tip and exquisitely tender.    Data Reviewed: Basic Metabolic Panel:  Recent Labs Lab 10/19/13 1933 10/20/13 2038  NA 137 136*  K 3.3* 4.0  CL 100 98  CO2 24 22  GLUCOSE 134* 111*  BUN 20 19  CREATININE 1.12* 1.06  CALCIUM 9.1 9.5   Liver Function Tests:  Recent Labs Lab 10/20/13 2038  AST 16  ALT 18  ALKPHOS 86  BILITOT 0.7  PROT 8.5*  ALBUMIN 3.5   No results found for this  basename: LIPASE, AMYLASE,  in the last 168 hours No results found for this basename: AMMONIA,  in the last 168 hours CBC:  Recent Labs Lab 10/19/13 1933 10/20/13 2038 10/22/13 0455 10/23/13 0453  WBC 12.4* 12.7* 7.3 8.8  NEUTROABS 10.6* 10.6*  --   --   HGB 12.9 13.7 11.2* 11.9*  HCT 39.1 41.2 35.6* 36.5  MCV 87.7 88.2 89.7 87.5  PLT 219 266 246 313   Cardiac Enzymes:  Recent Labs Lab 10/20/13 2038  CKTOTAL 34   BNP (last 3 results) No results found for this  basename: PROBNP,  in the last 8760 hours CBG: No results found for this basename: GLUCAP,  in the last 168 hours  No results found for this or any previous visit (from the past 240 hour(s)).     Studies: Mr Foot Right Wo Contrast  10/22/2013   CLINICAL DATA:  Pain and swelling.  Blister on the fourth toe.  EXAM: MRI OF THE RIGHT FOREFOOT WITHOUT CONTRAST  TECHNIQUE: Multiplanar, multisequence MR imaging was performed. No intravenous contrast was administered.  COMPARISON:  None.  FINDINGS: Examination is limited without IV contrast.  There is diffuse subcutaneous soft tissue swelling/ edema involving the in 4 for and midfoot mainly along the dorsum of the foot. This is likely cellulitis. I do not see a discrete fluid collection to suggest abscess. No findings for myofasciitis or pyomyositis.  No definite MR findings to suggest osteomyelitis or septic arthritis. No findings for septic tenosynovitis.  IMPRESSION: Diffuse subcutaneous soft tissue swelling/ edema involving the forefoot and midfoot suggesting cellulitis.  No findings for myofasciitis, septic arthritis or osteomyelitis.   Electronically Signed   By: Loralie Champagne M.D.   On: 10/22/2013 21:47        Scheduled Meds: . enoxaparin (LOVENOX) injection  50 mg Subcutaneous Q24H  . estradiol  0.05 mg Transdermal Weekly  . famotidine  20 mg Oral BID  . FLUoxetine  20 mg Oral Daily  . vancomycin  1,000 mg Intravenous Q12H   Continuous Infusions:   Principal Problem:   Cellulitis of right foot Active Problems:   Mood disorder   GERD (gastroesophageal reflux disease)   Nausea with vomiting   Hypokalemia    Time spent: 25 minutes.    Marcellus Scott, MD, FACP, FHM. [facility] Pager [phone]  If 7PM-7AM, please contact night-coverage [email or web address] Password [email or web address] 10/23/2013, 1:35 PM    LOS: 3 days

## 2013-10-23 NOTE — Progress Notes (Signed)
PROGRESS NOTE    Charlotte Fox MWN:[id number] DOB: 1966-01-06 DOA: 10/20/2013 PCP: Gweneth Dimitri, MD  HPI/Brief narrative 48 year old female with history of hypertension, GERD, mood disorder, presented to the ED with complaints of nausea, vomiting, progressively worsening swelling, pain and redness of right foot/leg with subjective fevers and chills. She was seen in ED and given oral doxycycline but due to persistent nausea and 2 episodes of vomiting, she returned to ED.  Assessment/Plan:  1. Cellulitis of the right leg/foot: Did not tolerate by mouth antibiotics. Started empirically on IV vancomycin. X-rays negative for acute findings. Elevated limb. Improving. No open wounds. Venous Doppler negative for DVT. Due to concern for right fourth toe abscess, orthopedics were consulted. Orthopedics input appreciated. MRI negative for abscess or osteomyelitis. Continues to improve. Continue IV vancomycin for additional 24 hours then consider discharge on oral doxycycline or alternate antibiotic based upon how she tolerates doxycycline. 2. Nausea and vomiting: Unclear etiology but resolved. Diet as tolerated. 3. Hypertension: Controlled-has not taken home meds for a few days. Monitor off medications. 4. Hypokalemia: Replaced 5. History of mood disorder: Continue home medications 6. Anemia: Stable 7. History of migraine: Complained of headache to nursing. Trial of NSAIDs-takes ketoprofen at home.  Code Status: Full Family Communication: None at bedside. Disposition Plan: Home possibly 9/25   Consultants:  Othropedics  Procedures:  None  Antibiotics:  IV vancomycin 9/21 >   Subjective: Swelling, pain and redness of her right leg/foot continuing to improve. Able to weight bear. Asking to shower. Mild headache consistent with her history of migraines.  Objective: Filed Vitals:   10/22/13 0516 10/22/13 1351 10/22/13 2148 10/23/13 0447  BP: 127/91 114/54 143/74 144/83  Pulse: 72  80 85 76  Temp: 98.1 F (36.7 C) 98.4 F (36.9 C) 99.2 F (37.3 C) 98.5 F (36.9 C)  TempSrc: Oral Oral Oral Oral  Resp: Height:      Weight: 110.2 kg (242 lb 15.2 oz)     SpO2: 100% 100% 100% 100%    Intake/Output Summary (Last 24 hours) at 10/23/13 1339 Last data filed at 10/23/13 1109  Gross per 24 hour  Intake    600 ml  Output   1925 ml  Net  -1325 ml   Filed Weights   10/20/13 2139 10/21/13 0852 10/22/13 0516  Weight: 106.5 kg (234 lb 12.6 oz) 106.5 kg (234 lb 12.6 oz) 110.2 kg (242 lb 15.2 oz)     Exam:  General exam: Moderately built and morbidly obese female patient sitting up comfortably in bed. Respiratory system: Clear. No increased work of breathing. Cardiovascular system: S1 & S2 heard, RRR. No JVD, murmurs, gallops, clicks or pedal edema. Gastrointestinal system: Abdomen is nondistended, soft and nontender. Normal bowel sounds heard. Central nervous system: Alert and oriented. No focal neurological deficits. Extremities: Symmetric 5 x 5 power. Patchy redness, tenderness and increased warmth of right leg and foot have significantly improved compared to 2 days ago. Now mild erythema and superficial blisters localized to right fourth toe and some erythema of the forefoot.   Data Reviewed: Basic Metabolic Panel:  Recent Labs Lab 10/19/13 1933 10/20/13 2038  NA 137 136*  K 3.3* 4.0  CL 100 98  CO2 24 22  GLUCOSE 134* 111*  BUN 20 19  CREATININE 1.12* 1.06  CALCIUM 9.1 9.5   Liver Function Tests:  Recent Labs Lab 10/20/13 2038  AST 16  ALT 18  ALKPHOS 86  BILITOT 0.7  PROT  8.5*  ALBUMIN 3.5   No results found for this basename: LIPASE, AMYLASE,  in the last 168 hours No results found for this basename: AMMONIA,  in the last 168 hours CBC:  Recent Labs Lab 10/19/13 1933 10/20/13 2038 10/22/13 0455 10/23/13 0453  WBC 12.4* 12.7* 7.3 8.8  NEUTROABS 10.6* 10.6*  --   --   HGB 12.9 13.7 11.2* 11.9*  HCT 39.1 41.2 35.6* 36.5   MCV 87.7 88.2 89.7 87.5  PLT 219 266 246 313   Cardiac Enzymes:  Recent Labs Lab 10/20/13 2038  CKTOTAL 34   BNP (last 3 results) No results found for this basename: PROBNP,  in the last 8760 hours CBG: No results found for this basename: GLUCAP,  in the last 168 hours  No results found for this or any previous visit (from the past 240 hour(s)).     Studies: Mr Foot Right Wo Contrast  10/22/2013   CLINICAL DATA:  Pain and swelling.  Blister on the fourth toe.  EXAM: MRI OF THE RIGHT FOREFOOT WITHOUT CONTRAST  TECHNIQUE: Multiplanar, multisequence MR imaging was performed. No intravenous contrast was administered.  COMPARISON:  None.  FINDINGS: Examination is limited without IV contrast.  There is diffuse subcutaneous soft tissue swelling/ edema involving the in 4 for and midfoot mainly along the dorsum of the foot. This is likely cellulitis. I do not see a discrete fluid collection to suggest abscess. No findings for myofasciitis or pyomyositis.  No definite MR findings to suggest osteomyelitis or septic arthritis. No findings for septic tenosynovitis.  IMPRESSION: Diffuse subcutaneous soft tissue swelling/ edema involving the forefoot and midfoot suggesting cellulitis.  No findings for myofasciitis, septic arthritis or osteomyelitis.   Electronically Signed   By: Loralie Champagne M.D.   On: 10/22/2013 21:47        Scheduled Meds: . enoxaparin (LOVENOX) injection  50 mg Subcutaneous Q24H  . estradiol  0.05 mg Transdermal Weekly  . famotidine  20 mg Oral BID  . FLUoxetine  20 mg Oral Daily  . vancomycin  1,000 mg Intravenous Q12H   Continuous Infusions:   Principal Problem:   Cellulitis of right foot Active Problems:   Mood disorder   GERD (gastroesophageal reflux disease)   Nausea with vomiting   Hypokalemia    Time spent: 25 minutes.    Marcellus Scott, MD, FACP, FHM. [facility] Pager [phone]  If 7PM-7AM, please contact  night-coverage [email or web address] Password [email or web address] 10/23/2013, 1:39 PM    LOS: 3 days

## 2013-10-23 NOTE — Progress Notes (Signed)
Rx Brief Antibiotic note:  Vancomycin 48 yoF with RLE cellultis.   Assessement:  VT=11.5 mg/L > goal 10-15  SCr stable  Plan:  Continue Vancomycin 1Gm IV q12h  F/U SCr/levels/cultures as needed  Lorenza Evangelist 10/23/2013 12:41 AM

## 2013-10-23 NOTE — Progress Notes (Signed)
This patient is receiving Pepcid. Based on criteria approved by the Pharmacy and Therapeutics Committee, this medication is being converted to the equivalent oral dose form. These criteria include:   . The patient is eating (either orally or per tube) and/or has been taking other orally administered medications for at least 24 hours.  . This patient has no evidence of active gastrointestinal bleeding or impaired GI absorption (gastrectomy, short bowel, patient on TNA or NPO).   If you have questions about this conversion, please contact the pharmacy department.  Berkley Harvey, [facility] 10/23/2013 9:01 AM

## 2013-10-23 NOTE — Progress Notes (Signed)
VASCULAR LAB PRELIMINARY  ARTERIAL  ABI completed:  ABIs and pedal waveforms normal.    RIGHT    LEFT    PRESSURE WAVEFORM  PRESSURE WAVEFORM  BRACHIAL 159 Triphasic  BRACHIAL 139 Triphasic  DP   DP    AT 159 Triphasic  AT 164 Triphasic   PT 152 Triphasic  PT 159 Triphasic   PER   PER    GREAT TOE  NA GREAT TOE  NA    RIGHT LEFT  ABI 1 1.03     Watt Geiler, RVT 10/23/2013, 10:51 AM

## 2013-10-24 ENCOUNTER — Encounter (HOSPITAL_COMMUNITY): Payer: Self-pay | Admitting: Pharmacist

## 2013-10-24 LAB — CREATININE, SERUM
Creatinine, Ser: 0.89 mg/dL (ref 0.50–1.10)
GFR calc non Af Amer: 76 mL/min — ABNORMAL LOW (ref >90)
GFR, EST AFRICAN AMERICAN: 88 mL/min — AB (ref >90)

## 2013-10-24 MED ORDER — DOXYCYCLINE HYCLATE 100 MG PO TABS
100.0000 mg | ORAL_TABLET | Freq: Two times a day (BID) | ORAL | Status: DC
  Administered 2013-10-24: 100 mg via ORAL
  Filled 2013-10-24 (×2): qty 1

## 2013-10-24 MED ORDER — IBUPROFEN 200 MG PO TABS: 600.0000 mg | ORAL_TABLET | Freq: Four times a day (QID) | ORAL | Status: DC | PRN

## 2013-10-24 NOTE — Discharge Instructions (Signed)

## 2013-10-24 NOTE — Discharge Summary (Signed)
Physician Discharge Summary  Charlotte Fox ZOX:[id number] DOB: Jul 12, 1965 DOA: 10/20/2013  PCP: Gweneth Dimitri, MD  Admit date: 10/20/2013 Discharge date: 10/24/2013  Time spent: Less than 30 minutes  Recommendations for Outpatient Follow-up:  1. Dr. Gweneth Dimitri, PCP in 1 week with repeat labs (CBC).  Discharge Diagnoses:  Principal Problem:   Cellulitis of right foot Active Problems:   Mood disorder   GERD (gastroesophageal reflux disease)   Nausea with vomiting   Hypokalemia   Discharge Condition: Improved & Stable  Diet recommendation: Heart healthy diet.  Filed Weights   10/22/13 0516 10/23/13 1520 10/24/13 0500  Weight: 110.2 kg (242 lb 15.2 oz) 107.366 kg (236 lb 11.2 oz) 107.276 kg (236 lb 8 oz)    History of present illness:  48 year old female with history of hypertension, GERD, mood disorder, presented to the ED with complaints of nausea, vomiting, progressively worsening swelling, pain and redness of right foot/leg with subjective fevers and chills. She was seen in ED and given oral doxycycline but due to persistent nausea and 2 episodes of vomiting, she returned to ED.  Hospital Course:   1. Cellulitis of the right leg/foot: Did not tolerate by mouth antibiotics. Started empirically on IV vancomycin. X-rays negative for acute findings. Elevated limb. Improving. No open wounds. Venous Doppler negative for DVT. Due to concern for right fourth toe abscess, orthopedics were consulted. Orthopedics input appreciated. MRI negative for abscess or osteomyelitis. Continues to improve. Patient was transitioned to oral doxycycline which she tolerated PTA. She will complete prior home week's course of doxycycline. 2. Nausea and vomiting: Unclear etiology-may have been related to acute infection- resolved.  3. Hypertension: Patient had not taken her oral medications for a few days PTA-likely secondary to problem #2. Resumed meds at DC. 4. Hypokalemia: Replaced 5. History of  mood disorder: Continue home medications 6. Anemia: Stable 7. History of migraine: Transient headache yesterday which resolved after Ibuprofen PRN.   Consultations:  Orthopedics  Procedures:  None    Discharge Exam:  Complaints:  Right foot/leg continues to improve with decreased pain, redness & swelling. Able to weight bear and ambulate. No nausea or vomiting. Tolerated oral doxycycline and diet.  Filed Vitals:   10/23/13 1520 10/23/13 2123 10/24/13 0500 10/24/13 0524  BP:  133/68  122/58  Pulse:  73  83  Temp:  98.9 F (37.2 C)  98.2 F (36.8 C)  TempSrc:  Oral  Oral  Resp:  20  16  Height:      Weight: 107.366 kg (236 lb 11.2 oz)  107.276 kg (236 lb 8 oz)   SpO2:  99%  95%   General exam: Moderately built and morbidly obese female patient sitting up comfortably in bed.  Respiratory system: Clear. No increased work of breathing.  Cardiovascular system: S1 & S2 heard, RRR. No JVD, murmurs, gallops, clicks or pedal edema.  Gastrointestinal system: Abdomen is nondistended, soft and nontender. Normal bowel sounds heard.  Central nervous system: Alert and oriented. No focal neurological deficits.  Extremities: Symmetric 5 x 5 power. Patchy redness, tenderness and increased warmth of right leg and foot have significantly improved compared to 3 days ago. Now mild erythema and superficial blisters localized to tip of right fourth toe and some minimal erythema of the forefoot.  Discharge Instructions      Discharge Instructions   Call MD for:  persistant nausea and vomiting    Complete by:  As directed      Call MD for:  redness, tenderness,  or signs of infection (pain, swelling, redness, odor or green/yellow discharge around incision site)    Complete by:  As directed      Call MD for:  severe uncontrolled pain    Complete by:  As directed      Call MD for:  temperature >100.4    Complete by:  As directed      Diet - low sodium heart healthy    Complete by:  As directed       Increase activity slowly    Complete by:  As directed             Medication List         acetaminophen 500 MG tablet  Commonly known as:  TYLENOL  Take 1,000 mg by mouth every 6 (six) hours as needed for mild pain.     atorvastatin 80 MG tablet  Commonly known as:  LIPITOR  Take 80 mg by mouth daily.     cholecalciferol 1000 UNITS tablet  Commonly known as:  VITAMIN D  Take 4,000 Units by mouth daily.     cyclobenzaprine 10 MG tablet  Commonly known as:  FLEXERIL  Take 10 mg by mouth 3 (three) times daily as needed for muscle spasms.     doxycycline 100 MG tablet  Commonly known as:  VIBRA-TABS  Take 100 mg by mouth 2 (two) times daily.     estradiol 0.05 mg/24hr patch  Commonly known as:  CLIMARA - Dosed in mg/24 hr  Place 0.05 mg onto the skin once a week. Change patch every Tuesday.     FLUoxetine 20 MG capsule  Commonly known as:  PROZAC  Take 20 mg by mouth daily.     ibuprofen 200 MG tablet  Commonly known as:  ADVIL,MOTRIN  Take 3 tablets (600 mg total) by mouth every 6 (six) hours as needed for headache or mild pain (Mild pain that is not relieved by Tylenol.).     omeprazole 20 MG capsule  Commonly known as:  PRILOSEC  Take 20 mg by mouth daily.     promethazine 25 MG tablet  Commonly known as:  PHENERGAN  Take 25 mg by mouth every 6 (six) hours as needed for nausea or vomiting.     TART CHERRY ADVANCED PO  Take 1 capsule by mouth daily at 12 noon.     telmisartan 80 MG tablet  Commonly known as:  MICARDIS  Take 80 mg by mouth daily.     traMADol 50 MG tablet  Commonly known as:  ULTRAM  Take 50 mg by mouth every 6 (six) hours as needed for moderate pain.       Follow-up Information   Follow up with MCNEILL,WENDY, MD. Schedule an appointment as soon as possible for a visit in 1 week. (To be seen with repeat labs (CBC).)    Specialty:  Family Medicine   Contact information:   [address] [place] [address] [phone]         The results of significant diagnostics from this hospitalization (including imaging, microbiology, ancillary and laboratory) are listed below for reference.    Significant Diagnostic Studies: Mr Foot Right Wo Contrast  10/22/2013   CLINICAL DATA:  Pain and swelling.  Blister on the fourth toe.  EXAM: MRI OF THE RIGHT FOREFOOT WITHOUT CONTRAST  TECHNIQUE: Multiplanar, multisequence MR imaging was performed. No intravenous contrast was administered.  COMPARISON:  None.  FINDINGS: Examination is limited without IV contrast.  There is diffuse subcutaneous soft tissue swelling/ edema involving the in 4 for and midfoot mainly along the dorsum of the foot. This is likely cellulitis. I do not see a discrete fluid collection to suggest abscess. No findings for myofasciitis or pyomyositis.  No definite MR findings to suggest osteomyelitis or septic arthritis. No findings for septic tenosynovitis.  IMPRESSION: Diffuse subcutaneous soft tissue swelling/ edema involving the forefoot and midfoot suggesting cellulitis.  No findings for myofasciitis, septic arthritis or osteomyelitis.   Electronically Signed   By: Loralie Champagne M.D.   On: 10/22/2013 21:47   Dg Ankle Right Port  10/20/2013   CLINICAL DATA:  Redness and swelling about the ankle without injury.  EXAM: PORTABLE RIGHT ANKLE - 2 VIEW  COMPARISON:  None.  FINDINGS: There is marked subcutaneous reticulation of the imaged lower extremity, particularly around the lateral ankle. There is no underlying fracture or malalignment. No evidence of osseous infection or joint effusion. No radiopaque foreign body or subcutaneous emphysema.  IMPRESSION: Diffuse soft tissue swelling. No subcutaneous gas or acute osseous finding.   Electronically Signed   By: Tiburcio Pea M.D.   On: 10/20/2013 22:20    Microbiology: No results found for this or any previous visit (from the past 240 hour(s)).   Labs: Basic Metabolic Panel:  Recent Labs Lab 10/19/13 1933  10/20/13 2038 10/24/13 0440  NA 137 136*  --   K 3.3* 4.0  --   CL 100 98  --   CO2 24 22  --   GLUCOSE 134* 111*  --   BUN 20 19  --   CREATININE 1.12* 1.06 0.89  CALCIUM 9.1 9.5  --    Liver Function Tests:  Recent Labs Lab 10/20/13 2038  AST 16  ALT 18  ALKPHOS 86  BILITOT 0.7  PROT 8.5*  ALBUMIN 3.5   No results found for this basename: LIPASE, AMYLASE,  in the last 168 hours No results found for this basename: AMMONIA,  in the last 168 hours CBC:  Recent Labs Lab 10/19/13 1933 10/20/13 2038 10/22/13 0455 10/23/13 0453  WBC 12.4* 12.7* 7.3 8.8  NEUTROABS 10.6* 10.6*  --   --   HGB 12.9 13.7 11.2* 11.9*  HCT 39.1 41.2 35.6* 36.5  MCV 87.7 88.2 89.7 87.5  PLT 219 266 246 313   Cardiac Enzymes:  Recent Labs Lab 10/20/13 2038  CKTOTAL 34   BNP: BNP (last 3 results) No results found for this basename: PROBNP,  in the last 8760 hours CBG: No results found for this basename: GLUCAP,  in the last 168 hours     Signed:  Marcellus Scott, MD, FACP, FHM. [facility] Pager [phone]  If 7PM-7AM, please contact night-coverage [email or web address] Password [email or web address] 10/24/2013, 1:49 PM

## 2014-02-24 ENCOUNTER — Ambulatory Visit (HOSPITAL_BASED_OUTPATIENT_CLINIC_OR_DEPARTMENT_OTHER)
Admission: RE | Admit: 2014-02-24 | Discharge: 2014-02-24 | Disposition: A | Discharge status: Home / Self Care | Payer: 59 | Source: Ambulatory Visit | Attending: Family Medicine | Admitting: Family Medicine

## 2014-02-24 ENCOUNTER — Encounter (HOSPITAL_BASED_OUTPATIENT_CLINIC_OR_DEPARTMENT_OTHER): Payer: Self-pay

## 2014-02-24 ENCOUNTER — Other Ambulatory Visit (HOSPITAL_BASED_OUTPATIENT_CLINIC_OR_DEPARTMENT_OTHER): Payer: Self-pay | Admitting: Family Medicine

## 2014-02-24 DIAGNOSIS — K429 Umbilical hernia without obstruction or gangrene: Secondary | ICD-10-CM | POA: Insufficient documentation

## 2014-02-24 DIAGNOSIS — R932 Abnormal findings on diagnostic imaging of liver and biliary tract: Secondary | ICD-10-CM | POA: Diagnosis not present

## 2014-02-24 DIAGNOSIS — R109 Unspecified abdominal pain: Secondary | ICD-10-CM

## 2014-02-24 DIAGNOSIS — R1012 Left upper quadrant pain: Secondary | ICD-10-CM | POA: Insufficient documentation

## 2014-02-24 DIAGNOSIS — R1032 Left lower quadrant pain: Secondary | ICD-10-CM

## 2014-02-24 DIAGNOSIS — K439 Ventral hernia without obstruction or gangrene: Secondary | ICD-10-CM | POA: Diagnosis not present

## 2014-02-24 DIAGNOSIS — R1031 Right lower quadrant pain: Secondary | ICD-10-CM

## 2014-02-24 MED ORDER — IOHEXOL 300 MG/ML  SOLN
100.0000 mL | Freq: Once | INTRAMUSCULAR | Status: AC | PRN
  Administered 2014-02-24: 100 mL via INTRAVENOUS

## 2014-02-26 ENCOUNTER — Other Ambulatory Visit: Payer: Self-pay | Admitting: Family Medicine

## 2014-02-26 DIAGNOSIS — R935 Abnormal findings on diagnostic imaging of other abdominal regions, including retroperitoneum: Secondary | ICD-10-CM

## 2014-03-03 ENCOUNTER — Other Ambulatory Visit (INDEPENDENT_AMBULATORY_CARE_PROVIDER_SITE_OTHER): Payer: Self-pay | Admitting: Surgery

## 2014-03-03 ENCOUNTER — Ambulatory Visit
Admission: RE | Admit: 2014-03-03 | Discharge: 2014-03-03 | Disposition: A | Discharge status: Home / Self Care | Payer: 59 | Source: Ambulatory Visit | Attending: Family Medicine | Admitting: Family Medicine

## 2014-03-03 DIAGNOSIS — R140 Abdominal distension (gaseous): Secondary | ICD-10-CM

## 2014-03-03 DIAGNOSIS — R935 Abnormal findings on diagnostic imaging of other abdominal regions, including retroperitoneum: Secondary | ICD-10-CM

## 2014-03-20 ENCOUNTER — Other Ambulatory Visit (INDEPENDENT_AMBULATORY_CARE_PROVIDER_SITE_OTHER): Payer: Self-pay

## 2014-03-21 ENCOUNTER — Other Ambulatory Visit (INDEPENDENT_AMBULATORY_CARE_PROVIDER_SITE_OTHER): Payer: Self-pay | Admitting: Surgery

## 2014-03-21 DIAGNOSIS — R1011 Right upper quadrant pain: Secondary | ICD-10-CM

## 2014-03-23 ENCOUNTER — Encounter (HOSPITAL_COMMUNITY): Payer: Self-pay

## 2014-03-23 ENCOUNTER — Ambulatory Visit (HOSPITAL_COMMUNITY)
Admission: RE | Admit: 2014-03-23 | Discharge: 2014-03-23 | Disposition: A | Discharge status: Home / Self Care | Payer: 59 | Source: Ambulatory Visit | Attending: Surgery | Admitting: Surgery

## 2014-03-23 DIAGNOSIS — R109 Unspecified abdominal pain: Secondary | ICD-10-CM | POA: Insufficient documentation

## 2014-03-23 DIAGNOSIS — R140 Abdominal distension (gaseous): Secondary | ICD-10-CM | POA: Insufficient documentation

## 2014-03-23 MED ORDER — TECHNETIUM TC 99M MEBROFENIN IV KIT
5.3000 | PACK | Freq: Once | INTRAVENOUS | Status: AC | PRN
  Administered 2014-03-23: 5.3 via INTRAVENOUS

## 2014-03-23 MED ORDER — FLUDEOXYGLUCOSE F - 18 (FDG) INJECTION: 5.3000 | Freq: Once | INTRAVENOUS | Status: DC | PRN

## 2014-03-23 MED ORDER — SINCALIDE 5 MCG IJ SOLR
0.0200 ug/kg | Freq: Once | INTRAMUSCULAR | Status: AC
  Administered 2014-03-23: 2 ug via INTRAVENOUS

## 2014-03-23 NOTE — Progress Notes (Signed)
Quick Note:  Please call the patient and let them know that their HIDA scan doesn't show any significant problems with the gallbladder. I need to see her in the office to discuss further plans. ______

## 2014-03-27 ENCOUNTER — Ambulatory Visit (INDEPENDENT_AMBULATORY_CARE_PROVIDER_SITE_OTHER): Payer: Self-pay | Admitting: Surgery

## 2014-03-27 NOTE — H&P (Signed)
History of Present Illness Charlotte Fox. Avalynn Bowe MD; 03/27/2014 11:50 AM) Patient words: discuss surgery.  The patient is a 49 year old female who presents with abdominal pain. This is a 49 year old female who presents with approximately 2 years of intermittent worsening epigastric abdominal pain that spreads across her upper abdomen. This tends to occur shortly after eating, especially with fatty meals. She has a fairly complex past surgical history. In January 2014 she was referred to gynecologic oncology at [facility] (Dr. Kyla Balzarine) for a large adnexal mass. 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 She underwent colonoscopy in May 2014 by Dr. Charlott Rakes with [facility]. Her symptoms have become more frequent and seemed to be related to eating. She reports associated nausea and diarrhea with these episodes. She underwent a CT scan which showed two small incisional hernias as well as an abnormal appearing gallbladder. 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 HIDA scan showed normal filling with an EF of 50%. She had significant symptoms after the CCK injection. She also two lower midline ventral hernias that are currently asymptomatic.     CLINICAL DATA: Upper and left upper quadrant abdominal pain.  EXAM: CT ABDOMEN AND PELVIS WITH CONTRAST  TECHNIQUE: Multidetector CT imaging of the abdomen and pelvis was performed using the standard protocol following bolus administration of intravenous contrast.  CONTRAST: OMNIPAQUE IOHEXOL 300 MG/ML SOLN  COMPARISON: 01/18/2012  FINDINGS: Lower chest: There is a 3 mm nodule in the anterior periphery of the right middle lobe which is benign and unchanged from 01/18/2012.  Hepatobiliary: There is a suggestion of gallbladder mural thickening. There is no bile duct dilatation. There are  normal appearances of the liver.  Pancreas: Normal  Spleen: Normal  Adrenals/Urinary Tract: The adrenals and kidneys are normal in appearance. Collecting systems and ureters appear normal. No urinary calculi are evident.  Stomach/Bowel: There are unremarkable appearances of the stomach and small bowel. There is prior sigmoid resection with unremarkable appearances of the anastomosis. A portion of the mid transverse colon protrudes into a wide-mouth umbilical hernia without obstruction or inflammation. The appendix is not visible and may have been removed. There is no CT evidence of appendicitis, diverticulitis or other acute inflammatory process involving the bowel.  Vascular/Lymphatic: The abdominal aorta is normal in caliber. No significant atherosclerotic disease is evident. There is no adenopathy in the abdomen or pelvis.  Reproductive: Prior hysterectomy. Neither ovary is visible.  Other: Wide-mouth umbilical hernia containing a portion of nonobstructed mid transverse colon. There also is an infraumbilical ventral hernia containing only fat.  Musculoskeletal: No significant bony abnormalities.  IMPRESSION: 1. The gallbladder wall appears mildly irregular, possibly thickened. This is not well characterized by CT. Consider right upper quadrant sonography for optimal characterization. 2. Umbilical hernia containing a portion of the mid transverse colon without obstruction or inflammation. 3. Fat containing infraumbilical ventral hernia 4. Unremarkable appearances of the sigmoid anastomosis   Electronically Signed By: Ellery Plunk M.D. On: 02/24/2014 19:32   CLINICAL DATA: Possible gallbladder wall thickening on prior exam, upper abdominal pain and left upper quadrant abdominal pain.  EXAM: ULTRASOUND ABDOMEN COMPLETE  COMPARISON: CT 02/24/2014.  FINDINGS: Gallbladder: No gallstones or wall thickening visualized. No sonographic Murphy sign noted.  Phrygian cap incidentally noted, which may have accounted for the appearance on prior CT and is an incidental finding.  Common bile duct: Diameter: 4 mm  Liver: Suboptimally  visualized due to body habitus, no focal abnormality or intrahepatic ductal dilatation.  IVC: No abnormality visualized.  Pancreas: Visualized portion unremarkable.  Spleen: Size and appearance within normal limits.  Right Kidney: Length: 10.2 cm. Echogenicity within normal limits. No mass or hydronephrosis visualized.  Left Kidney: Length: 11.6 cm. Echogenicity within normal limits. No mass or hydronephrosis visualized.  Abdominal aorta: No aneurysm visualized.  Other findings: None.  IMPRESSION: Normal exam.   Electronically Signed By: Christiana PellantGretchen Green M.D. On: 03/03/2014 14:40   CLINICAL DATA: Postprandial abdominal bloating abdominal pain without nausea  EXAM: NUCLEAR MEDICINE HEPATOBILIARY IMAGING WITH GALLBLADDER EF  TECHNIQUE: Sequential images of the abdomen were obtained out to 60 minutes following intravenous administration of radiopharmaceutical. After slow intravenous infusion of 2.0 micrograms Cholecystokinin, gallbladder ejection fraction was determined.  RADIOPHARMACEUTICALS: 5.3 mCi Tc-5053m Choletec IV  COMPARISON: None  FINDINGS: Normal tracer extraction from bloodstream indicating normal hepatocellular function.  Normal excretion of tracer into biliary tree.  Gallbladder visualized at 13 min.  Small bowel visualized at 39 min.  No hepatic retention of tracer.  Subjectively normal emptying of tracer from gallbladder following CCK administration.  Calculated gallbladder ejection fraction is 50%, normal.  Patient experienced nausea and lightheadedness following CCK administration.  IMPRESSION: Patent biliary tree.  Normal gallbladder response to CCK stimulation with a normal gallbladder ejection fraction of 50%.  Nausea and lightheadedness following  CCK.   Electronically Signed By: Ulyses SouthwardMark Boles M.D. On: 03/23/2014 10:52 Allergies (Ammie Eversole, LPN; 1/61/09602/26/2016 4:549:49 AM) Erythromycin *MACROLIDES* Azithromycin *MACROLIDES* Allegra *ANTIHISTAMINES* Sulfa Antibiotics Levaquin *FLUOROQUINOLONES* Penicillins Vicodin *ANALGESICS - OPIOID*  Medication History (Ammie Eversole, LPN; 0/98/11912/26/2016 4:789:49 AM) Tylenol Cold (30-325-15MG  Tablet, Oral) Active. Lipitor (80MG  Tablet, Oral) Active. LORazepam (0.5MG  Tablet, Oral) Active. TraMADol HCl (50MG  Tablet, Oral) Active. Voltaren (1% Gel, Transdermal) Active. Omeprazole (20MG  Capsule DR, Oral) Active. Ketoprofen (75MG  Capsule, Oral) Active. FLUoxetine HCl (20MG  Capsule, Oral) Active. Estradiol (0.05MG /24HR Patch Weekly, Transdermal) Active. Cholecalciferol (10000UNIT Capsule, Oral) Active. Cyclobenzaprine HCl (10MG  Tablet, Oral) Active. Phenergan (25MG  Tablet, Oral) Active. Micardis HCT (80-12.5MG  Tablet, Oral) Active.    Vitals (Ammie Eversole LPN; 2/95/62132/26/2016 0:869:49 AM) 03/27/2014 9:48 AM Weight: 248 lb Height: 66in Body Surface Area: 2.29 m Body Mass Index: 40.03 kg/m Temp.: 98.34F(Oral)  Pulse: 86 (Regular)  BP: 110/82 (Sitting, Left Arm, Standard)     Physical Exam Molli Hazard(Worth Kober K. Mayling Aber MD; 03/27/2014 11:50 AM)  The physical exam findings are as follows: Note:WDWN in NAD HEENT: EOMI, sclera anicteric Neck: No masses, no thyromegaly Lungs: CTA bilaterally; normal respiratory effort CV: Regular rate and rhythm; no murmurs Abd: +bowel sounds, obese, healed lower midline incision; palpable bulge near umbilicus and below the umbilicus; non-tender in this area Mildly tender in epigastrium Ext: Well-perfused; no edema Skin: Warm, dry; no sign of jaundice    Assessment & Plan Molli Hazard(Cloey Sferrazza K. Netanya Yazdani MD; 03/27/2014 11:53 AM)  CHRONIC CHOLECYSTITIS WITHOUT CALCULUS (575.11  K81.1)  Current Plans Schedule for Surgery - Laparoscopic cholecystectomy with  intraoperative cholangiogram. The surgical procedure has been discussed with the patient. Potential risks, benefits, alternative treatments, and expected outcomes have been explained. All of the patient's questions at this time have been answered. The likelihood of reaching the patient's treatment goal is good. The patient understand the proposed surgical procedure and wishes to proceed. Note:Despite her negative ultrasound and normal GB EF, the patient describes symptoms that are classic for biliary colic and chronic cholecystitis. She had an EGD within the last few years that was unremarkable. She understands that there is no  guarantee of alleviating her symptoms, but I believe that an elective cholecystectomy has a good chance of alleviating her post-prandial pain and nausea symptoms. I explained to her that performing a mesh ventral hernia repair at the same time as a cholecystectomy increases the risk of mesh infection, so I would recommend laparoscopic cholecystectomy first, then either a laparoscopic or open ventral hernia repair with mesh at a later date. She is asymptomatic from her hernias at this time. She seems to understand and wishes to proceed.   Charlotte Fox. Corliss Skains, MD, [facility] Surgery  General/ Trauma Surgery  03/27/2014 11:53 AM

## 2014-05-11 NOTE — Pre-Procedure Instructions (Signed)
Charlotte DuosChristine M Fox  05/11/2014   Your procedure is scheduled on:  Thursday, April 21.  [facility] Ad[facility] at 5:30 AM.  Call this number if you have problems the morning of surgery: [phone]                For any other questions, please call [phone], Monday - Friday 8 AM - 4 PM.   Remember:   Do not eat food or drink liquids after midnight Wednesday, April 20.   Take these medicines the morning of surgery with A SIP OF WATER: FLUoxetine (PROZAC), loratadine (CLARITIN),  omeprazole (PRILOSEC).              Take if needed:traMADol (ULTRAM), promethazine (PHENERGAN), methocarbamol (ROBAXIN), LORazepam (ATIVAN), loratadine (CLARITIN).            Stop taking Aspirin, Coumadin, Plavix, Effient and Herbal medications.  Do not take any NSAIDs ie: Ibuprofen,  Advil,Naproxen or any medication containing Aspirin- April 14.   Do not wear jewelry, make-up or nail polish.  Do not wear lotions, powders, or perfumes.  Do not shave 48 hours prior to surgery.   Do not bring valuables to the hospital.             [facility] is not responsible for any belongings or valuables.               Contacts, dentures or bridgework may not be worn into surgery.  Leave suitcase in the car. After surgery it may be brought to your room.  For patients admitted to the hospital, discharge time is determined by your treatment team.               Patients discharged the day of surgery will not be allowed to drive home.  Name and phone number of your driver: -   Special Instructions: Review  [place] - Preparing For Surgery.   Please read over the following fact sheets that you were given: Pain Booklet, Coughing and Deep Breathing and Surgical Site Infection Prevention

## 2014-05-12 ENCOUNTER — Encounter (HOSPITAL_COMMUNITY)
Admission: RE | Admit: 2014-05-12 | Discharge: 2014-05-12 | Disposition: A | Discharge status: Home / Self Care | Payer: 59 | Source: Ambulatory Visit | Attending: Surgery | Admitting: Surgery

## 2014-05-12 ENCOUNTER — Encounter (HOSPITAL_COMMUNITY): Payer: Self-pay

## 2014-05-12 DIAGNOSIS — Z01810 Encounter for preprocedural cardiovascular examination: Secondary | ICD-10-CM | POA: Insufficient documentation

## 2014-05-12 DIAGNOSIS — Z01812 Encounter for preprocedural laboratory examination: Secondary | ICD-10-CM | POA: Diagnosis present

## 2014-05-12 HISTORY — DX: Cerebral infarction, unspecified: I63.9

## 2014-05-12 HISTORY — DX: Adverse effect of unspecified anesthetic, initial encounter: T41.45XA

## 2014-05-12 HISTORY — DX: Personal history of other diseases of the digestive system: Z87.19

## 2014-05-12 HISTORY — DX: Major depressive disorder, single episode, unspecified: F32.9

## 2014-05-12 HISTORY — DX: Fibromyalgia: M79.7

## 2014-05-12 HISTORY — DX: Depression, unspecified: F32.A

## 2014-05-12 HISTORY — DX: Patent foramen ovale: Q21.12

## 2014-05-12 HISTORY — DX: Other complications of anesthesia, initial encounter: T88.59XA

## 2014-05-12 HISTORY — DX: Headache: R51

## 2014-05-12 HISTORY — DX: Headache, unspecified: R51.9

## 2014-05-12 HISTORY — DX: Atrial septal defect: Q21.1

## 2014-05-12 LAB — BASIC METABOLIC PANEL
Anion gap: 9 (ref 5–15)
BUN: 15 mg/dL (ref 6–23)
CO2: 26 mmol/L (ref 19–32)
CREATININE: 0.98 mg/dL (ref 0.50–1.10)
Calcium: 9.1 mg/dL (ref 8.4–10.5)
Chloride: 106 mmol/L (ref 96–112)
GFR, EST AFRICAN AMERICAN: 78 mL/min — AB (ref >90)
GFR, EST NON AFRICAN AMERICAN: 67 mL/min — AB (ref >90)
Glucose, Bld: 99 mg/dL (ref 70–99)
Potassium: 4.3 mmol/L (ref 3.5–5.1)
Sodium: 141 mmol/L (ref 135–145)

## 2014-05-12 LAB — CBC
HCT: 37.5 % (ref 36.0–46.0)
Hemoglobin: 12.3 g/dL (ref 12.0–15.0)
MCH: 28.6 pg (ref 26.0–34.0)
MCHC: 32.8 g/dL (ref 30.0–36.0)
MCV: 87.2 fL (ref 78.0–100.0)
PLATELETS: 263 10*3/uL (ref 150–400)
RBC: 4.3 MIL/uL (ref 3.87–5.11)
RDW: 13.6 % (ref 11.5–15.5)
WBC: 6.9 10*3/uL (ref 4.0–10.5)

## 2014-05-12 NOTE — Progress Notes (Addendum)
Anesthesia Chart Review:  Pt is 49 year old female scheduled for laparoscopic cholecystectomy with intraoperative cholangiogram on 05/21/2014 with Dr. Corliss Skainssuei.   PCP is Dr. Selena BattenWendy McNeil.   PMH includes: HTN, stroke, PFO, high cholesterol. Never smoker. BMI 40. Pt reports propofol dropped respirations during colonoscopy.   Preoperative labs reviewed.    EKG: NSR. Nonspecific T wave abnormality.   Notes from [facility] in Epic 07/01/2007 indicate pt was scheduled to have PFO closed. [facility]ing to [facility].   Rica Mastngela Kabbe, FNP-BC [facility]/Anesthesiology Phone: [phone] 05/12/2014 3:44 PM  Addendum: No cardiology records at the [facility] office.  I spoke with patient this this afternoon.  She reports that her PFO was formerly followed by [facility] [place] [facility] now a [facility].  She reports that she was told that her PFO would not need to be repaired unless she had another neurologic event.     07/09/07 TEE:  SUMMARY - Overall left ventricular systolic function was normal. Left ventricular ejection fraction was estimated to be 60 %. There were no left ventricular regional wall motion abnormalities. - There was no atheroma of the aortic arch. - The left atrial appendage function was normal (normal emptying velocity). There was no left atrial appendage thrombus identified. - There was moderate right-to-left shunt at rest by contrast study with agitated saline. - There is a moderate sized PFO with moderate right to left shunt. IMPRESSION: There is a moderate sized PFO with moderate right to left shunt.  06/19/07 CTA neck: IMPRESSION: There is very early atherosclerotic calcification of the carotid bulb bilaterally. There is no significant carotid or vertebral stenosis in the neck.  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  She gets mild intermittent right ankle edema since having a right knee surgery.  She has to climb stairs daily as she lives on the second floor and denies any CV symptoms with this level of activity.   She has known PFO, so she will need close attention to avoid air in any of her IV lines.  Reviewed with anesthesiologist Dr. Renold DonGermeroth.  Velna Ochsllison Daquawn Seelman, PA-C [facility]/Anesthesiology Phone [phone] 05/14/2014 1:53 PM

## 2014-05-12 NOTE — Progress Notes (Signed)
   05/12/14 0826  OBSTRUCTIVE SLEEP APNEA  Have you ever been diagnosed with sleep apnea through a sleep study? No  Do you snore loudly (loud enough to be heard through closed doors)?  0  Do you often feel tired, fatigued, or sleepy during the daytime? 1  Has anyone observed you stop breathing during your sleep? 0  Do you have, or are you being treated for high blood pressure? 1  BMI more than 35 kg/m2? 1  Age over 48 years old? 0  Neck circumference greater than 40 cm/16 inches? 1  Gender: 0

## 2014-05-20 MED ORDER — CHLORHEXIDINE GLUCONATE 4 % EX LIQD
1.0000 "application " | Freq: Once | CUTANEOUS | Status: DC
  Filled 2014-05-20: qty 15

## 2014-05-21 ENCOUNTER — Encounter (HOSPITAL_COMMUNITY)
Admission: RE | Disposition: A | Discharge status: Home / Self Care | Payer: Self-pay | Source: Ambulatory Visit | Attending: Surgery

## 2014-05-21 ENCOUNTER — Encounter (HOSPITAL_COMMUNITY): Payer: Self-pay | Admitting: Anesthesiology

## 2014-05-21 ENCOUNTER — Ambulatory Visit (HOSPITAL_COMMUNITY): Payer: 59 | Admitting: Anesthesiology

## 2014-05-21 ENCOUNTER — Ambulatory Visit (HOSPITAL_COMMUNITY)
Admission: RE | Admit: 2014-05-21 | Discharge: 2014-05-21 | Disposition: A | Discharge status: Home / Self Care | Payer: 59 | Source: Ambulatory Visit | Attending: Surgery | Admitting: Surgery

## 2014-05-21 ENCOUNTER — Ambulatory Visit (HOSPITAL_COMMUNITY): Payer: 59

## 2014-05-21 ENCOUNTER — Ambulatory Visit (HOSPITAL_COMMUNITY): Payer: 59 | Admitting: Emergency Medicine

## 2014-05-21 DIAGNOSIS — Z9049 Acquired absence of other specified parts of digestive tract: Secondary | ICD-10-CM | POA: Insufficient documentation

## 2014-05-21 DIAGNOSIS — Z881 Allergy status to other antibiotic agents status: Secondary | ICD-10-CM | POA: Insufficient documentation

## 2014-05-21 DIAGNOSIS — Z980 Intestinal bypass and anastomosis status: Secondary | ICD-10-CM | POA: Diagnosis not present

## 2014-05-21 DIAGNOSIS — Z882 Allergy status to sulfonamides status: Secondary | ICD-10-CM | POA: Insufficient documentation

## 2014-05-21 DIAGNOSIS — I739 Peripheral vascular disease, unspecified: Secondary | ICD-10-CM | POA: Diagnosis not present

## 2014-05-21 DIAGNOSIS — K219 Gastro-esophageal reflux disease without esophagitis: Secondary | ICD-10-CM | POA: Insufficient documentation

## 2014-05-21 DIAGNOSIS — Z6839 Body mass index (BMI) 39.0-39.9, adult: Secondary | ICD-10-CM | POA: Insufficient documentation

## 2014-05-21 DIAGNOSIS — M1990 Unspecified osteoarthritis, unspecified site: Secondary | ICD-10-CM | POA: Insufficient documentation

## 2014-05-21 DIAGNOSIS — K439 Ventral hernia without obstruction or gangrene: Secondary | ICD-10-CM | POA: Diagnosis not present

## 2014-05-21 DIAGNOSIS — K429 Umbilical hernia without obstruction or gangrene: Secondary | ICD-10-CM | POA: Diagnosis not present

## 2014-05-21 DIAGNOSIS — Z880 Allergy status to penicillin: Secondary | ICD-10-CM | POA: Diagnosis not present

## 2014-05-21 DIAGNOSIS — Z419 Encounter for procedure for purposes other than remedying health state, unspecified: Secondary | ICD-10-CM

## 2014-05-21 DIAGNOSIS — E780 Pure hypercholesterolemia: Secondary | ICD-10-CM | POA: Insufficient documentation

## 2014-05-21 DIAGNOSIS — Z90710 Acquired absence of both cervix and uterus: Secondary | ICD-10-CM | POA: Diagnosis not present

## 2014-05-21 DIAGNOSIS — Z8673 Personal history of transient ischemic attack (TIA), and cerebral infarction without residual deficits: Secondary | ICD-10-CM | POA: Diagnosis not present

## 2014-05-21 DIAGNOSIS — Z885 Allergy status to narcotic agent status: Secondary | ICD-10-CM | POA: Diagnosis not present

## 2014-05-21 DIAGNOSIS — F329 Major depressive disorder, single episode, unspecified: Secondary | ICD-10-CM | POA: Insufficient documentation

## 2014-05-21 DIAGNOSIS — M797 Fibromyalgia: Secondary | ICD-10-CM | POA: Insufficient documentation

## 2014-05-21 DIAGNOSIS — K811 Chronic cholecystitis: Secondary | ICD-10-CM | POA: Diagnosis not present

## 2014-05-21 DIAGNOSIS — I10 Essential (primary) hypertension: Secondary | ICD-10-CM | POA: Diagnosis not present

## 2014-05-21 HISTORY — PX: CHOLECYSTECTOMY: SHX55

## 2014-05-21 SURGERY — LAPAROSCOPIC CHOLECYSTECTOMY WITH INTRAOPERATIVE CHOLANGIOGRAM
Anesthesia: General | Site: Abdomen

## 2014-05-21 MED ORDER — CEFAZOLIN SODIUM-DEXTROSE 2-3 GM-% IV SOLR
2.0000 g | Freq: Three times a day (TID) | INTRAVENOUS | Status: DC
  Administered 2014-05-21: 2 g via INTRAVENOUS
  Filled 2014-05-21 (×2): qty 50

## 2014-05-21 MED ORDER — LIDOCAINE HCL (CARDIAC) 20 MG/ML IV SOLN
INTRAVENOUS | Status: AC
  Filled 2014-05-21: qty 5

## 2014-05-21 MED ORDER — OXYCODONE HCL 5 MG PO TABS: 5.0000 mg | ORAL_TABLET | Freq: Once | ORAL | Status: DC | PRN

## 2014-05-21 MED ORDER — MORPHINE SULFATE 2 MG/ML IJ SOLN: 2.0000 mg | INTRAMUSCULAR | Status: DC | PRN

## 2014-05-21 MED ORDER — ONDANSETRON HCL 4 MG/2ML IJ SOLN: 4.0000 mg | Freq: Four times a day (QID) | INTRAMUSCULAR | Status: DC | PRN

## 2014-05-21 MED ORDER — MIDAZOLAM HCL 5 MG/5ML IJ SOLN
INTRAMUSCULAR | Status: DC | PRN
  Administered 2014-05-21: 2 mg via INTRAVENOUS

## 2014-05-21 MED ORDER — ONDANSETRON HCL 4 MG PO TABS: 4.0000 mg | ORAL_TABLET | Freq: Three times a day (TID) | ORAL | Status: DC | PRN

## 2014-05-21 MED ORDER — TRAMADOL HCL 50 MG PO TABS: 50.0000 mg | ORAL_TABLET | Freq: Four times a day (QID) | ORAL | Status: DC | PRN

## 2014-05-21 MED ORDER — SUCCINYLCHOLINE CHLORIDE 20 MG/ML IJ SOLN
INTRAMUSCULAR | Status: AC
Start: 2014-05-21 — End: 2014-05-21
  Filled 2014-05-21: qty 1

## 2014-05-21 MED ORDER — DEXAMETHASONE SODIUM PHOSPHATE 4 MG/ML IJ SOLN
INTRAMUSCULAR | Status: DC | PRN
  Administered 2014-05-21: 4 mg via INTRAVENOUS

## 2014-05-21 MED ORDER — ACETAMINOPHEN 10 MG/ML IV SOLN
1000.0000 mg | Freq: Once | INTRAVENOUS | Status: AC
  Administered 2014-05-21: 1000 mg via INTRAVENOUS

## 2014-05-21 MED ORDER — PHENYLEPHRINE 40 MCG/ML (10ML) SYRINGE FOR IV PUSH (FOR BLOOD PRESSURE SUPPORT)
PREFILLED_SYRINGE | INTRAVENOUS | Status: AC
  Filled 2014-05-21: qty 10

## 2014-05-21 MED ORDER — FENTANYL CITRATE (PF) 250 MCG/5ML IJ SOLN
INTRAMUSCULAR | Status: AC
  Filled 2014-05-21: qty 5

## 2014-05-21 MED ORDER — EPHEDRINE SULFATE 50 MG/ML IJ SOLN
INTRAMUSCULAR | Status: AC
  Filled 2014-05-21: qty 1

## 2014-05-21 MED ORDER — SODIUM CHLORIDE 0.9 % IV SOLN
INTRAVENOUS | Status: DC | PRN
  Administered 2014-05-21: 10 mL

## 2014-05-21 MED ORDER — PROPOFOL 10 MG/ML IV BOLUS
INTRAVENOUS | Status: DC | PRN
  Administered 2014-05-21: 190 mg via INTRAVENOUS

## 2014-05-21 MED ORDER — ROCURONIUM BROMIDE 50 MG/5ML IV SOLN
INTRAVENOUS | Status: AC
  Filled 2014-05-21: qty 1

## 2014-05-21 MED ORDER — NEOSTIGMINE METHYLSULFATE 10 MG/10ML IV SOLN
INTRAVENOUS | Status: AC
  Filled 2014-05-21: qty 1

## 2014-05-21 MED ORDER — GLYCOPYRROLATE 0.2 MG/ML IJ SOLN
INTRAMUSCULAR | Status: DC | PRN
  Administered 2014-05-21: 0.4 mg via INTRAVENOUS

## 2014-05-21 MED ORDER — NEOSTIGMINE METHYLSULFATE 10 MG/10ML IV SOLN
INTRAVENOUS | Status: DC | PRN
  Administered 2014-05-21: 3 mg via INTRAVENOUS

## 2014-05-21 MED ORDER — GLYCOPYRROLATE 0.2 MG/ML IJ SOLN
INTRAMUSCULAR | Status: AC
  Filled 2014-05-21: qty 1

## 2014-05-21 MED ORDER — GLYCOPYRROLATE 0.2 MG/ML IJ SOLN
INTRAMUSCULAR | Status: AC
  Filled 2014-05-21: qty 2

## 2014-05-21 MED ORDER — LIDOCAINE HCL (CARDIAC) 20 MG/ML IV SOLN
INTRAVENOUS | Status: DC | PRN
  Administered 2014-05-21: 100 mg via INTRATRACHEAL
  Administered 2014-05-21: 50 mg via INTRAVENOUS

## 2014-05-21 MED ORDER — SODIUM CHLORIDE 0.9 % IR SOLN
Status: DC | PRN
  Administered 2014-05-21: 1000 mL

## 2014-05-21 MED ORDER — ONDANSETRON HCL 4 MG/2ML IJ SOLN
INTRAMUSCULAR | Status: DC | PRN
  Administered 2014-05-21: 4 mg via INTRAVENOUS

## 2014-05-21 MED ORDER — HYDROMORPHONE HCL 1 MG/ML IJ SOLN
0.2500 mg | INTRAMUSCULAR | Status: DC | PRN
  Administered 2014-05-21 (×2): 0.5 mg via INTRAVENOUS

## 2014-05-21 MED ORDER — CEFAZOLIN SODIUM-DEXTROSE 2-3 GM-% IV SOLR
INTRAVENOUS | Status: AC
  Filled 2014-05-21: qty 50

## 2014-05-21 MED ORDER — LACTATED RINGERS IV SOLN
INTRAVENOUS | Status: DC | PRN
  Administered 2014-05-21 (×2): via INTRAVENOUS

## 2014-05-21 MED ORDER — EPHEDRINE SULFATE 50 MG/ML IJ SOLN
INTRAMUSCULAR | Status: DC | PRN
  Administered 2014-05-21: 5 mg via INTRAVENOUS

## 2014-05-21 MED ORDER — BUPIVACAINE-EPINEPHRINE (PF) 0.25% -1:200000 IJ SOLN
INTRAMUSCULAR | Status: AC
  Filled 2014-05-21: qty 30

## 2014-05-21 MED ORDER — MIDAZOLAM HCL 2 MG/2ML IJ SOLN
INTRAMUSCULAR | Status: AC
  Filled 2014-05-21: qty 2

## 2014-05-21 MED ORDER — OXYCODONE HCL 5 MG/5ML PO SOLN: 5.0000 mg | Freq: Once | ORAL | Status: DC | PRN

## 2014-05-21 MED ORDER — PROPOFOL 10 MG/ML IV BOLUS
INTRAVENOUS | Status: AC
  Filled 2014-05-21: qty 20

## 2014-05-21 MED ORDER — SODIUM CHLORIDE 0.9 % IJ SOLN
INTRAMUSCULAR | Status: AC
  Filled 2014-05-21: qty 10

## 2014-05-21 MED ORDER — FENTANYL CITRATE (PF) 100 MCG/2ML IJ SOLN
INTRAMUSCULAR | Status: DC | PRN
  Administered 2014-05-21 (×4): 50 ug via INTRAVENOUS

## 2014-05-21 MED ORDER — ONDANSETRON HCL 4 MG/2ML IJ SOLN
4.0000 mg | INTRAMUSCULAR | Status: DC | PRN
  Filled 2014-05-21: qty 2

## 2014-05-21 MED ORDER — DEXAMETHASONE SODIUM PHOSPHATE 4 MG/ML IJ SOLN
INTRAMUSCULAR | Status: AC
  Filled 2014-05-21: qty 1

## 2014-05-21 MED ORDER — ROCURONIUM BROMIDE 100 MG/10ML IV SOLN
INTRAVENOUS | Status: DC | PRN
  Administered 2014-05-21: 40 mg via INTRAVENOUS

## 2014-05-21 MED ORDER — ONDANSETRON HCL 4 MG/2ML IJ SOLN
INTRAMUSCULAR | Status: AC
  Filled 2014-05-21: qty 2

## 2014-05-21 MED ORDER — HYDROMORPHONE HCL 1 MG/ML IJ SOLN
INTRAMUSCULAR | Status: AC
  Filled 2014-05-21: qty 1

## 2014-05-21 MED ORDER — TRAMADOL HCL 50 MG PO TABS: 100.0000 mg | ORAL_TABLET | Freq: Four times a day (QID) | ORAL | Status: DC | PRN

## 2014-05-21 MED ORDER — ACETAMINOPHEN 10 MG/ML IV SOLN
INTRAVENOUS | Status: AC
  Filled 2014-05-21: qty 100

## 2014-05-21 MED ORDER — BUPIVACAINE-EPINEPHRINE 0.25% -1:200000 IJ SOLN
INTRAMUSCULAR | Status: DC | PRN
  Administered 2014-05-21: 14 mL

## 2014-05-21 SURGICAL SUPPLY — 44 items
APPLIER CLIP ROT 10 11.4 M/L (STAPLE) ×2
BENZOIN TINCTURE PRP APPL 2/3 (GAUZE/BANDAGES/DRESSINGS) ×2 IMPLANT
BLADE SURG ROTATE 9660 (MISCELLANEOUS) IMPLANT
CANISTER SUCTION 2500CC (MISCELLANEOUS) ×2 IMPLANT
CHLORAPREP W/TINT 26ML (MISCELLANEOUS) ×2 IMPLANT
CLIP APPLIE ROT 10 11.4 M/L (STAPLE) ×1 IMPLANT
COVER MAYO STAND STRL (DRAPES) ×2 IMPLANT
COVER SURGICAL LIGHT HANDLE (MISCELLANEOUS) ×2 IMPLANT
DRAPE C-ARM 42X72 X-RAY (DRAPES) ×2 IMPLANT
DRAPE LAPAROSCOPIC ABDOMINAL (DRAPES) ×2 IMPLANT
DRSG TEGADERM 2-3/8X2-3/4 SM (GAUZE/BANDAGES/DRESSINGS) ×6 IMPLANT
DRSG TEGADERM 4X4.75 (GAUZE/BANDAGES/DRESSINGS) ×2 IMPLANT
ELECT REM PT RETURN 9FT ADLT (ELECTROSURGICAL) ×2
ELECTRODE REM PT RTRN 9FT ADLT (ELECTROSURGICAL) ×1 IMPLANT
FILTER SMOKE EVAC LAPAROSHD (FILTER) ×2 IMPLANT
GAUZE SPONGE 2X2 8PLY STRL LF (GAUZE/BANDAGES/DRESSINGS) ×1 IMPLANT
GLOVE BIO SURGEON STRL SZ7 (GLOVE) ×2 IMPLANT
GLOVE BIOGEL PI IND STRL 7.0 (GLOVE) ×1 IMPLANT
GLOVE BIOGEL PI IND STRL 7.5 (GLOVE) ×2 IMPLANT
GLOVE BIOGEL PI INDICATOR 7.0 (GLOVE) ×1
GLOVE BIOGEL PI INDICATOR 7.5 (GLOVE) ×2
GLOVE SURG SS PI 7.0 STRL IVOR (GLOVE) ×2 IMPLANT
GOWN STRL REUS W/ TWL LRG LVL3 (GOWN DISPOSABLE) ×3 IMPLANT
GOWN STRL REUS W/TWL LRG LVL3 (GOWN DISPOSABLE) ×3
KIT BASIN OR (CUSTOM PROCEDURE TRAY) ×2 IMPLANT
KIT ROOM TURNOVER OR (KITS) ×2 IMPLANT
NS IRRIG 1000ML POUR BTL (IV SOLUTION) ×2 IMPLANT
PAD ARMBOARD 7.5X6 YLW CONV (MISCELLANEOUS) ×2 IMPLANT
POUCH SPECIMEN RETRIEVAL 10MM (ENDOMECHANICALS) ×2 IMPLANT
SCISSORS LAP 5X35 DISP (ENDOMECHANICALS) ×2 IMPLANT
SET CHOLANGIOGRAPH 5 50 .035 (SET/KITS/TRAYS/PACK) ×2 IMPLANT
SET IRRIG TUBING LAPAROSCOPIC (IRRIGATION / IRRIGATOR) ×2 IMPLANT
SLEEVE ENDOPATH XCEL 5M (ENDOMECHANICALS) ×2 IMPLANT
SPECIMEN JAR SMALL (MISCELLANEOUS) ×2 IMPLANT
SPONGE GAUZE 2X2 STER 10/PKG (GAUZE/BANDAGES/DRESSINGS) ×1
STRIP CLOSURE SKIN 1/2X4 (GAUZE/BANDAGES/DRESSINGS) ×2 IMPLANT
SUT MNCRL AB 4-0 PS2 18 (SUTURE) ×2 IMPLANT
TOWEL OR 17X24 6PK STRL BLUE (TOWEL DISPOSABLE) ×2 IMPLANT
TOWEL OR 17X26 10 PK STRL BLUE (TOWEL DISPOSABLE) ×2 IMPLANT
TRAY LAPAROSCOPIC (CUSTOM PROCEDURE TRAY) ×2 IMPLANT
TROCAR XCEL BLUNT TIP 100MML (ENDOMECHANICALS) ×2 IMPLANT
TROCAR XCEL NON-BLD 11X100MML (ENDOMECHANICALS) ×2 IMPLANT
TROCAR XCEL NON-BLD 5MMX100MML (ENDOMECHANICALS) ×2 IMPLANT
TUBING INSUFFLATION (TUBING) ×2 IMPLANT

## 2014-05-21 NOTE — Anesthesia Postprocedure Evaluation (Signed)
Anesthesia Post Note  Patient: Charlotte Fox  Procedure(s) Performed: Procedure(s) (LRB): LAPAROSCOPIC CHOLECYSTECTOMY WITH INTRAOPERATIVE CHOLANGIOGRAM (N/A)  Anesthesia type: General  Patient location: PACU  Post pain: Pain level controlled and Adequate analgesia  Post assessment: Post-op Vital signs reviewed, Patient's Cardiovascular Status Stable, Respiratory Function Stable, Patent Airway and Pain level controlled  Last Vitals:  Filed Vitals:   05/21/14 1013  BP: 127/56  Pulse: 79  Temp:   Resp: 16    Post vital signs: Reviewed and stable  Level of consciousness: awake, alert  and oriented  Complications: No apparent anesthesia complications

## 2014-05-21 NOTE — Anesthesia Procedure Notes (Signed)
Procedure Name: Intubation Date/Time: 05/21/2014 7:41 AM Performed by: Lovie CholOCK, Yomira Flitton K Pre-anesthesia Checklist: Patient identified, Emergency Drugs available, Suction available, Patient being monitored and Timeout performed Patient Re-evaluated:Patient Re-evaluated prior to inductionOxygen Delivery Method: Circle system utilized Preoxygenation: Pre-oxygenation with 100% oxygen Intubation Type: IV induction and Cricoid Pressure applied Ventilation: Mask ventilation without difficulty and Oral airway inserted - appropriate to patient size Laryngoscope Size: Miller and 2 Grade View: Grade I Tube type: Oral Tube size: 7.0 mm Number of attempts: 1 Airway Equipment and Method: Stylet Placement Confirmation: ETT inserted through vocal cords under direct vision,  positive ETCO2,  CO2 detector and breath sounds checked- equal and bilateral Secured at: 21 cm Tube secured with: Tape Dental Injury: Teeth and Oropharynx as per pre-operative assessment

## 2014-05-21 NOTE — Transfer of Care (Signed)
Immediate Anesthesia Transfer of Care Note  Patient: Charlotte Fox  Procedure(s) Performed: Procedure(s): LAPAROSCOPIC CHOLECYSTECTOMY WITH INTRAOPERATIVE CHOLANGIOGRAM (N/A)  Patient Location: PACU  Anesthesia Type:General  Level of Consciousness: awake, oriented and patient cooperative  Airway & Oxygen Therapy: Patient Spontanous Breathing and Patient connected to nasal cannula oxygen  Post-op Assessment: Report given to RN and Post -op Vital signs reviewed and stable  Post vital signs: Reviewed  Last Vitals:  Filed Vitals:   05/21/14 0622  BP: 142/80  Pulse: 95  Temp: 36.8 C  Resp: 20    Complications: No apparent anesthesia complications

## 2014-05-21 NOTE — Anesthesia Preprocedure Evaluation (Addendum)
Anesthesia Evaluation  Patient identified by MRN, date of birth, ID band Patient awake    Reviewed: Allergy & Precautions, NPO status , Patient's Chart, lab work & pertinent test results  Airway Mallampati: II  TM Distance: >3 FB Neck ROM: full    Dental  (+) Edentulous Upper, Edentulous Lower, Dental Advisory Given   Pulmonary  breath sounds clear to auscultation        Cardiovascular hypertension, + Peripheral Vascular Disease Rhythm:regular Rate:Normal     Neuro/Psych  Headaches, Depression CVA    GI/Hepatic hiatal hernia, GERD-  ,  Endo/Other  Morbid obesity  Renal/GU      Musculoskeletal  (+) Arthritis -, Fibromyalgia -  Abdominal   Peds  Hematology   Anesthesia Other Findings   Reproductive/Obstetrics                            Anesthesia Physical Anesthesia Plan  ASA: III  Anesthesia Plan: General   Post-op Pain Management:    Induction: Intravenous  Airway Management Planned: Oral ETT  Additional Equipment:   Intra-op Plan:   Post-operative Plan: Extubation in OR  Informed Consent: I have reviewed the patients History and Physical, chart, labs and discussed the procedure including the risks, benefits and alternatives for the proposed anesthesia with the patient or authorized representative who has indicated his/her understanding and acceptance.     Plan Discussed with: CRNA, Anesthesiologist and Surgeon  Anesthesia Plan Comments:         Anesthesia Quick Evaluation

## 2014-05-21 NOTE — Discharge Instructions (Signed)
[facility] °LAPAROSCOPIC SURGERY: POST OP INSTRUCTIONS °Always review your discharge instruction sheet given to you by the facility where your surgery was performed. °IF YOU HAVE DISABILITY OR FAMILY LEAVE FORMS, YOU MUST BRING THEM TO THE OFFICE FOR PROCESSING.   °DO NOT GIVE THEM TO YOUR DOCTOR. ° °1. A prescription for pain medication will be given to you upon discharge.  Take your pain medication as prescribed, if needed.  If narcotic pain medicine is not needed, then you may take acetaminophen (Tylenol) or ibuprofen (Advil) as needed. °2. Take your usually prescribed medications unless otherwise directed. °3. If you need a refill on your pain medication, please contact your pharmacy.  They will contact our office to request authorization. Prescriptions will not be filled after 5pm or on week-ends. °4. You should follow a light diet the first few days after arrival home, such as soup and crackers, etc.  Be sure to include lots of fluids daily. °5. Most patients will experience some swelling and bruising in the area of the incisions.  Ice packs will help.  Swelling and bruising can take several days to resolve.  °6. It is common to experience some constipation if taking pain medication after surgery.  Increasing fluid intake and taking a stool softener (such as Colace) will usually help or prevent this problem from occurring.  A mild laxative (Milk of Magnesia or Miralax) should be taken according to package instructions if there are no bowel movements after 48 hours. °7. Unless discharge instructions indicate otherwise, you may remove your bandages 48 hours after surgery, and you may shower at that time.  You will have steri-strips (small skin tapes) in place directly over the incision.  These strips should be left on the skin for 7-10 days.  If your surgeon used skin glue on the incision, you may shower in 24 hours.  The glue will flake off over the next 2-3 weeks.  Any sutures or staples  will be removed at the office during your follow-up visit. °8. ACTIVITIES:  You may resume regular (light) daily activities beginning the next day--such as daily self-care, walking, climbing stairs--gradually increasing activities as tolerated.  You may have sexual intercourse when it is comfortable.  Refrain from any heavy lifting or straining until approved by your doctor. °a. You may drive when you are no longer taking prescription pain medication, you can comfortably wear a seatbelt, and you can safely maneuver your car and apply brakes. °b. RETURN TO WORK:   2-3 weeks °9. You should see your doctor in the office for a follow-up appointment approximately 2-3 weeks after your surgery.  Make sure that you call for this appointment within a day or two after you arrive home to insure a convenient appointment time. °10. OTHER INSTRUCTIONS: ________________________________________________________________________ °WHEN TO CALL YOUR DOCTOR: °1. Fever over 101.0 °2. Inability to urinate °3. Continued bleeding from incision. °4. Increased pain, redness, or drainage from the incision. °5. Increasing abdominal pain ° °The clinic staff is available to answer your questions during regular business hours.  Please don’t hesitate to call and ask to speak to one of the nurses for clinical concerns.  If you have a medical emergency, go to the nearest emergency room or call 911.  A surgeon from [facility] is always on call at the hospital. °[address], [address], [place], [place]  [address] ? [address] [address], [place], [place]   [address] °[phone] ? [phone] ? FAX [phone] °Web site:   [email or web address] ° °

## 2014-05-21 NOTE — Op Note (Signed)
Laparoscopic Cholecystectomy with IOC Procedure Note  Indications: This patient presents with RUQ symptoms consistent with gallbladder disease and will undergo laparoscopic cholecystectomy.  Pre-operative Diagnosis: Chronic cholecystitis  Post-operative Diagnosis: Same  Surgeon: Keondre Markson K.   Assistants: none  Anesthesia: General endotracheal anesthesia  ASA Class: 2  Procedure Details  The patient was seen again in the Holding Room. The risks, benefits, complications, treatment options, and expected outcomes were discussed with the patient. The possibilities of reaction to medication, pulmonary aspiration, perforation of viscus, bleeding, recurrent infection, finding a normal gallbladder, the need for additional procedures, failure to diagnose a condition, the possible need to convert to an open procedure, and creating a complication requiring transfusion or operation were discussed with the patient. The likelihood of improving the patient's symptoms with return to their baseline status is good.  The patient and/or family concurred with the proposed plan, giving informed consent. The site of surgery properly noted. The patient was taken to Operating Room, identified as Charlotte Fox and the procedure verified as Laparoscopic Cholecystectomy with Intraoperative Cholangiogram. A Time Out was held and the above information confirmed.  Prior to the induction of general anesthesia, antibiotic prophylaxis was administered. General endotracheal anesthesia was then administered and tolerated well. After the induction, the abdomen was prepped with Chloraprep and draped in the sterile fashion. The patient was positioned in the supine position.  Local anesthetic agent was injected into the skin near the umbilicus and an incision made. We dissected down to the abdominal fascia with blunt dissection.  The fascia was incised vertically and we entered the peritoneal cavity bluntly.  The patient has  some laxity and scarring of the umbilical fascia from her previous surgeries.  A pursestring suture of 0-Vicryl was placed around the fascial opening.  The Hasson cannula was inserted and secured with the stay suture.  Pneumoperitoneum was then created with CO2 and tolerated well without any adverse changes in the patient's vital signs. An 11-mm port was placed in the subxiphoid position.  Two 5-mm ports were placed in the right upper quadrant. All skin incisions were infiltrated with a local anesthetic agent before making the incision and placing the trocars.   We positioned the patient in reverse Trendelenburg, tilted slightly to the patient's left.  The gallbladder was identified, the fundus grasped and retracted cephalad. There were some omental adhesions to the surface of the gallbladder.  Adhesions were lysed bluntly and with the electrocautery where indicated, taking care not to injure any adjacent organs or viscus. The infundibulum was grasped and retracted laterally, exposing the peritoneum overlying the triangle of Calot. This was then divided and exposed in a blunt fashion. A critical view of the cystic duct and cystic artery was obtained.  The cystic duct was clearly identified and bluntly dissected circumferentially. The cystic duct was ligated with a clip distally.   An incision was made in the cystic duct and the [facility] cholangiogram catheter introduced. The catheter was secured using a clip. A cholangiogram was then obtained which showed good visualization of the distal and proximal biliary tree with no sign of filling defects or obstruction.  Contrast flowed easily into the duodenum. The catheter was then removed.   The cystic duct was then ligated with clips and divided. The cystic artery was identified, dissected free, ligated with clips and divided as well.   The gallbladder was dissected from the liver bed in retrograde fashion with the electrocautery. The gallbladder was removed and placed  in an Endocatch sac.  The liver bed was irrigated and inspected. Hemostasis was achieved with the electrocautery. Copious irrigation was utilized and was repeatedly aspirated until clear.  The gallbladder and Endocatch sac were then removed through the umbilical port site.  The pursestring suture was used to close the umbilical fascia.    We again inspected the right upper quadrant for hemostasis.  Pneumoperitoneum was released as we removed the trocars.  4-0 Monocryl was used to close the skin.   Benzoin, steri-strips, and clean dressings were applied. The patient was then extubated and brought to the recovery room in stable condition. Instrument, sponge, and needle counts were correct at closure and at the conclusion of the case.   Findings: Cholecystitis without Cholelithiasis  Estimated Blood Loss: Minimal         Drains: none         Specimens: Gallbladder           Complications: None; patient tolerated the procedure well.         Disposition: PACU - hemodynamically stable.         Condition: stable  Charlotte ArmsMatthew K. Corliss Skainssuei, MD, [facility]  General/ Trauma Surgery  05/21/2014 8:42 AM

## 2014-05-21 NOTE — H&P (Signed)
History of Present Illness Patient words: discuss surgery.  The patient is a 49 year old female who presents with abdominal pain. This is a 49 year old female who presents with approximately 2 years of intermittent worsening epigastric abdominal pain that spreads across her upper abdomen. This tends to occur shortly after eating, especially with fatty meals. She has a fairly complex past surgical history. In January 2014 she was referred to gynecologic oncology at [facility] (Dr. Kyla Balzarine) for a large adnexal mass. 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 She underwent colonoscopy in May 2014 by Dr. Charlott Rakes with [facility]. Her symptoms have become more frequent and seemed to be related to eating. She reports associated nausea and diarrhea with these episodes. She underwent a CT scan which showed two small incisional hernias as well as an abnormal appearing gallbladder. 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 HIDA scan showed normal filling with an EF of 50%. She had significant symptoms after the CCK injection. She also two lower midline ventral hernias that are currently asymptomatic.     CLINICAL DATA: Upper and left upper quadrant abdominal pain.  EXAM: CT ABDOMEN AND PELVIS WITH CONTRAST  TECHNIQUE: Multidetector CT imaging of the abdomen and pelvis was performed using the standard protocol following bolus administration of intravenous contrast.  CONTRAST: OMNIPAQUE IOHEXOL 300 MG/ML SOLN  COMPARISON: 01/18/2012  FINDINGS: Lower chest: There is a 3 mm nodule in the anterior periphery of the right middle lobe which is benign and unchanged from 01/18/2012.  Hepatobiliary: There is a suggestion of gallbladder mural thickening. There is no bile duct dilatation. There are normal appearances of the liver.  Pancreas:  Normal  Spleen: Normal  Adrenals/Urinary Tract: The adrenals and kidneys are normal in appearance. Collecting systems and ureters appear normal. No urinary calculi are evident.  Stomach/Bowel: There are unremarkable appearances of the stomach and small bowel. There is prior sigmoid resection with unremarkable appearances of the anastomosis. A portion of the mid transverse colon protrudes into a wide-mouth umbilical hernia without obstruction or inflammation. The appendix is not visible and may have been removed. There is no CT evidence of appendicitis, diverticulitis or other acute inflammatory process involving the bowel.  Vascular/Lymphatic: The abdominal aorta is normal in caliber. No significant atherosclerotic disease is evident. There is no adenopathy in the abdomen or pelvis.  Reproductive: Prior hysterectomy. Neither ovary is visible.  Other: Wide-mouth umbilical hernia containing a portion of nonobstructed mid transverse colon. There also is an infraumbilical ventral hernia containing only fat.  Musculoskeletal: No significant bony abnormalities.  IMPRESSION: 1. The gallbladder wall appears mildly irregular, possibly thickened. This is not well characterized by CT. Consider right upper quadrant sonography for optimal characterization. 2. Umbilical hernia containing a portion of the mid transverse colon without obstruction or inflammation. 3. Fat containing infraumbilical ventral hernia 4. Unremarkable appearances of the sigmoid anastomosis   Electronically Signed By: Ellery Plunk M.D. On: 02/24/2014 19:32   CLINICAL DATA: Possible gallbladder wall thickening on prior exam, upper abdominal pain and left upper quadrant abdominal pain.  EXAM: ULTRASOUND ABDOMEN COMPLETE  COMPARISON: CT 02/24/2014.  FINDINGS: Gallbladder: No gallstones or wall thickening visualized. No sonographic Murphy sign noted. Phrygian cap incidentally noted, which may have  accounted for the appearance on prior CT and is an incidental finding.  Common bile duct: Diameter: 4 mm  Liver: Suboptimally visualized due to body habitus, no focal  abnormality or intrahepatic ductal dilatation.  IVC: No abnormality visualized.  Pancreas: Visualized portion unremarkable.  Spleen: Size and appearance within normal limits.  Right Kidney: Length: 10.2 cm. Echogenicity within normal limits. No mass or hydronephrosis visualized.  Left Kidney: Length: 11.6 cm. Echogenicity within normal limits. No mass or hydronephrosis visualized.  Abdominal aorta: No aneurysm visualized.  Other findings: None.  IMPRESSION: Normal exam.   Electronically Signed By: Christiana PellantGretchen Green M.D. On: 03/03/2014 14:40   CLINICAL DATA: Postprandial abdominal bloating abdominal pain without nausea  EXAM: NUCLEAR MEDICINE HEPATOBILIARY IMAGING WITH GALLBLADDER EF  TECHNIQUE: Sequential images of the abdomen were obtained out to 60 minutes following intravenous administration of radiopharmaceutical. After slow intravenous infusion of 2.0 micrograms Cholecystokinin, gallbladder ejection fraction was determined.  RADIOPHARMACEUTICALS: 5.3 mCi Tc-8914m Choletec IV  COMPARISON: None  FINDINGS: Normal tracer extraction from bloodstream indicating normal hepatocellular function.  Normal excretion of tracer into biliary tree.  Gallbladder visualized at 13 min.  Small bowel visualized at 39 min.  No hepatic retention of tracer.  Subjectively normal emptying of tracer from gallbladder following CCK administration.  Calculated gallbladder ejection fraction is 50%, normal.  Patient experienced nausea and lightheadedness following CCK administration.  IMPRESSION: Patent biliary tree.  Normal gallbladder response to CCK stimulation with a normal gallbladder ejection fraction of 50%.  Nausea and lightheadedness following CCK.   Electronically Signed By: Ulyses SouthwardMark Boles  M.D. On: 03/23/2014 10:52 Allergies (Ammie Eversole, LPN; 8/46/96292/26/2016 5:289:49 AM) Erythromycin *MACROLIDES* Azithromycin *MACROLIDES* Allegra *ANTIHISTAMINES* Sulfa Antibiotics Levaquin *FLUOROQUINOLONES* Penicillins Vicodin *ANALGESICS - OPIOID*  Medication History  Tylenol Cold (30-325-15MG  Tablet, Oral) Active. Lipitor (80MG  Tablet, Oral) Active. LORazepam (0.5MG  Tablet, Oral) Active. TraMADol HCl (50MG  Tablet, Oral) Active. Voltaren (1% Gel, Transdermal) Active. Omeprazole (20MG  Capsule DR, Oral) Active. Ketoprofen (75MG  Capsule, Oral) Active. FLUoxetine HCl (20MG  Capsule, Oral) Active. Estradiol (0.05MG /24HR Patch Weekly, Transdermal) Active. Cholecalciferol (10000UNIT Capsule, Oral) Active. Cyclobenzaprine HCl (10MG  Tablet, Oral) Active. Phenergan (25MG  Tablet, Oral) Active. Micardis HCT (80-12.5MG  Tablet, Oral) Active.    Vitals 03/27/2014 9:48 AM Weight: 248 lb Height: 66in Body Surface Area: 2.29 m Body Mass Index: 40.03 kg/m Temp.: 98.46F(Oral)  Pulse: 86 (Regular)  BP: 110/82 (Sitting, Left Arm, Standard)     Physical Exam  The physical exam findings are as follows: Note:WDWN in NAD HEENT: EOMI, sclera anicteric Neck: No masses, no thyromegaly Lungs: CTA bilaterally; normal respiratory effort CV: Regular rate and rhythm; no murmurs Abd: +bowel sounds, obese, healed lower midline incision; palpable bulge near umbilicus and below the umbilicus; non-tender in this area Mildly tender in epigastrium Ext: Well-perfused; no edema Skin: Warm, dry; no sign of jaundice    Assessment & Plan   CHRONIC CHOLECYSTITIS WITHOUT CALCULUS (575.11  K81.1)  Current Plans Schedule for Surgery - Laparoscopic cholecystectomy with intraoperative cholangiogram. The surgical procedure has been discussed with the patient. Potential risks, benefits, alternative treatments, and expected outcomes have been explained. All of the patient's questions  at this time have been answered. The likelihood of reaching the patient's treatment goal is good. The patient understand the proposed surgical procedure and wishes to proceed. Note:Despite her negative ultrasound and normal GB EF, the patient describes symptoms that are classic for biliary colic and chronic cholecystitis. She had an EGD within the last few years that was unremarkable. She understands that there is no guarantee of alleviating her symptoms, but I believe that an elective cholecystectomy has a good chance of alleviating her post-prandial pain and nausea symptoms. I explained to her that performing a  mesh ventral hernia repair at the same time as a cholecystectomy increases the risk of mesh infection, so I would recommend laparoscopic cholecystectomy first, then either a laparoscopic or open ventral hernia repair with mesh at a later date. She is asymptomatic from her hernias at this time. She seems to understand and wishes to proceed.   Wilmon Arms. Corliss Skains, MD, [facility]  General/ Trauma Surgery  05/21/2014 7:19 AM

## 2014-05-25 ENCOUNTER — Encounter (HOSPITAL_COMMUNITY): Payer: Self-pay | Admitting: Surgery

## 2014-08-18 ENCOUNTER — Ambulatory Visit: Payer: Self-pay | Admitting: Surgery

## 2014-08-18 NOTE — H&P (Signed)
History of Present Illness  Patient words: Hernia.  The patient is a 49 year old female who presents with an incisional hernia. Referred by Dr. Gweneth DimitriWendy McNeill for evaluation of ventral hernia.  This is a 49 year old female who presents with approximately 2 years of intermittent worsening epigastric abdominal pain that spreads across her upper abdomen, mostly towards the left. She has a fairly complex past surgical history. In January 2014 she was referred to gynecologic oncology at [facility] (Dr. Kyla BalzarineSoper) for a large adnexal mass. She underwent hysterectomy, appendectomy, and probable sigmoid colectomy. We have no records from that hospitalization at the time of this visit. The mass turned out to be benign. She underwent colonoscopy in May 2014 by Dr. Charlott RakesVincent Schooler with [facility].  Her symptoms have become more frequent and seemed to be related to eating. She reports associated nausea and diarrhea with these episodes. She underwent a CT scan which showed two small incisional hernias as well as an abnormal appearing gallbladder. She underwent laparoscopic cholecystectomy with intraoperative cholangiogram on 05/21/14. She is doing quite well. Abdominal pain is resolved. She has resumed a regular diet. No issues with diarrhea. She had minimal pain after surgery.      CLINICAL DATA: Upper and left upper quadrant abdominal pain.  EXAM: CT ABDOMEN AND PELVIS WITH CONTRAST  TECHNIQUE: Multidetector CT imaging of the abdomen and pelvis was performed using the standard protocol following bolus administration of intravenous contrast.  CONTRAST: 100mL OMNIPAQUE IOHEXOL 300 MG/ML SOLN  COMPARISON: 01/18/2012  FINDINGS: Lower chest: There is a 3 mm nodule in the anterior periphery of the right middle lobe which is benign and unchanged from 01/18/2012.  Hepatobiliary: There is a suggestion of gallbladder mural thickening. There is no bile duct dilatation. There are normal appearances  of the liver.  Pancreas: Normal  Spleen: Normal  Adrenals/Urinary Tract: The adrenals and kidneys are normal in appearance. Collecting systems and ureters appear normal. No urinary calculi are evident.  Stomach/Bowel: There are unremarkable appearances of the stomach and small bowel. There is prior sigmoid resection with unremarkable appearances of the anastomosis. A portion of the mid transverse colon protrudes into a wide-mouth umbilical hernia without obstruction or inflammation. The appendix is not visible and may have been removed. There is no CT evidence of appendicitis, diverticulitis or other acute inflammatory process involving the bowel.  Vascular/Lymphatic: The abdominal aorta is normal in caliber. No significant atherosclerotic disease is evident. There is no adenopathy in the abdomen or pelvis.  Reproductive: Prior hysterectomy. Neither ovary is visible.  Other: Wide-mouth umbilical hernia containing a portion of nonobstructed mid transverse colon. There also is an infraumbilical ventral hernia containing only fat.  Musculoskeletal: No significant bony abnormalities.  IMPRESSION: 1. The gallbladder wall appears mildly irregular, possibly thickened. This is not well characterized by CT. Consider right upper quadrant sonography for optimal characterization. 2. Umbilical hernia containing a portion of the mid transverse colon without obstruction or inflammation. 3. Fat containing infraumbilical ventral hernia 4. Unremarkable appearances of the sigmoid anastomosis   Electronically Signed By: Ellery Plunkaniel R Mitchell M.D. On: 02/24/2014 19:32    Allergies  Erythromycin *MACROLIDES* Azithromycin *MACROLIDES* Allegra *ANTIHISTAMINES* Sulfa Antibiotics Levaquin *FLUOROQUINOLONES* Penicillins Vicodin *ANALGESICS - OPIOID*  Medication History  Tylenol Cold (30-325-15MG  Tablet, Oral) Active. Lipitor (80MG  Tablet, Oral) Active. LORazepam (0.5MG   Tablet, Oral) Active. TraMADol HCl (50MG  Tablet, Oral) Active. Voltaren (1% Gel, Transdermal) Active. Omeprazole (20MG  Capsule DR, Oral) Active. Ketoprofen (75MG  Capsule, Oral) Active. FLUoxetine HCl (20MG  Capsule, Oral) Active. Estradiol (0.05MG /24HR  Patch Weekly, Transdermal) Active. Cholecalciferol (10000UNIT Capsule, Oral) Active. Cyclobenzaprine HCl (  Tablet, Oral) Active. Phenergan (  Tablet, Oral) Active. Micardis HCT (80-12.5MG  Tablet, Oral) Active. Medications Reconciled  Vitals Weight: 244 lb Height: 66in Body Surface Area: 2.27 m Body Mass Index: 39.38 kg/m Temp.: 98.49F(Oral)  Pulse: 91 (Regular)  Resp.: 17 (Unlabored)  BP: 130/70 (Sitting, Left Arm, Standard)    Physical Exam  The physical exam findings are as follows: Note:WDWN in NAD Abd - incisions well-healed with no sign of infection Palpable periumbilical hernia - not reducible; non-tender    Assessment & Plan  CHRONIC CHOLECYSTITIS WITHOUT CALCULUS (575.11  K81.1) VENTRAL INCISIONAL HERNIA WITHOUT OBSTRUCTION OR GANGRENE (553.21  K43.2) Current Plans  Schedule for Surgery - Laparoscopic ventral hernia repair with mesh. The surgical procedure has been discussed with the patient. Potential risks, benefits, alternative treatments, and expected outcomes have been explained. All of the patient's questions at this time have been answered. The likelihood of reaching the patient's treatment goal is good. The patient understand the proposed surgical procedure and wishes to proceed.  She is doing from the standpoint of her gallbladder surgery. We will plan her hernia repair later this summer/ fall. She wants to think about possibly August.  Pat Sires K. Corliss Skains, MD, [facility]  General/ Trauma Surgery  08/18/2014 9:40 PM

## 2014-08-21 ENCOUNTER — Encounter (HOSPITAL_COMMUNITY): Payer: Self-pay

## 2014-08-21 ENCOUNTER — Other Ambulatory Visit (HOSPITAL_COMMUNITY): Payer: Self-pay | Admitting: *Deleted

## 2014-08-21 ENCOUNTER — Encounter (HOSPITAL_COMMUNITY)
Admission: RE | Admit: 2014-08-21 | Discharge: 2014-08-21 | Disposition: A | Discharge status: Home / Self Care | Payer: 59 | Source: Ambulatory Visit | Attending: Surgery | Admitting: Surgery

## 2014-08-21 DIAGNOSIS — Z01812 Encounter for preprocedural laboratory examination: Secondary | ICD-10-CM | POA: Insufficient documentation

## 2014-08-21 HISTORY — DX: Cardiac murmur, unspecified: R01.1

## 2014-08-21 HISTORY — DX: Anemia, unspecified: D64.9

## 2014-08-21 LAB — CBC
HCT: 38.5 % (ref 36.0–46.0)
HEMOGLOBIN: 12.5 g/dL (ref 12.0–15.0)
MCH: 28.3 pg (ref 26.0–34.0)
MCHC: 32.5 g/dL (ref 30.0–36.0)
MCV: 87.1 fL (ref 78.0–100.0)
PLATELETS: 257 10*3/uL (ref 150–400)
RBC: 4.42 MIL/uL (ref 3.87–5.11)
RDW: 13.5 % (ref 11.5–15.5)
WBC: 5.6 10*3/uL (ref 4.0–10.5)

## 2014-08-21 LAB — BASIC METABOLIC PANEL
Anion gap: 8 (ref 5–15)
BUN: 12 mg/dL (ref 6–20)
CALCIUM: 9.2 mg/dL (ref 8.9–10.3)
CO2: 24 mmol/L (ref 22–32)
CREATININE: 0.86 mg/dL (ref 0.44–1.00)
Chloride: 109 mmol/L (ref 101–111)
GFR calc non Af Amer: >60 mL/min (ref >60)
GLUCOSE: 113 mg/dL — AB (ref 65–99)
POTASSIUM: 4 mmol/L (ref 3.5–5.1)
SODIUM: 141 mmol/L (ref 135–145)

## 2014-08-21 NOTE — Pre-Procedure Instructions (Signed)
Charlotte Fox  08/21/2014      Your procedure is scheduled on Tuesday, September 01, 2014 at 7:30 AM.   Report to [facility] Entrance "A" Admitting Office at 5:30 AM.   Call this number if you have problems the morning of surgery: [phone]   Any questions prior to day of surgery, please call [phone] between 8 & 4 PM.   Remember:  Do not eat food or drink liquids after midnight Monday, 08/31/14.  Take these medicines the morning of surgery with A SIP OF WATER: Fluoxetine (Prozac), Omeprazole (Prilosec), Claritin - if needed, Ativan - if needed, Tramadol - if needed.  Stop Aspirin, Herbal Medications and NSAIDS (Ibuprofen, Ketoprofen, Aleve, etc.) 7 days prior to surgery.   Do not wear jewelry, make-up or nail polish.  Do not wear lotions, powders, or perfumes.  You may wear deodorant.  Do not shave 48 hours prior to surgery.    Do not bring valuables to the hospital.  [facility] is not responsible for any belongings or valuables.  Contacts, dentures or bridgework may not be worn into surgery.  Leave your suitcase in the car.  After surgery it may be brought to your room.  For patients admitted to the hospital, discharge time will be determined by your treatment team.  Special instructions:  [place] - Preparing for Surgery  Before surgery, you can play an important role.  Because skin is not sterile, your skin needs to be as free of germs as possible.  You can reduce the number of germs on you skin by washing with CHG (chlorahexidine gluconate) soap before surgery.  CHG is an antiseptic cleaner which kills germs and bonds with the skin to continue killing germs even after washing.  Please DO NOT use if you have an allergy to CHG or antibacterial soaps.  If your skin becomes reddened/irritated stop using the CHG and inform your nurse when you arrive at Short Stay.  Do not shave (including legs and underarms) for at least 48 hours prior to the first CHG shower.   You may shave your face.  Please follow these instructions carefully:   1.  Shower with CHG Soap the night before surgery and the                                morning of Surgery.  2.  If you choose to wash your hair, wash your hair first as usual with your       normal shampoo.  3.  After you shampoo, rinse your hair and body thoroughly to remove the                      Shampoo.  4.  Use CHG as you would any other liquid soap.  You can apply chg directly       to the skin and wash gently with scrungie or a clean washcloth.  5.  Apply the CHG Soap to your body ONLY FROM THE NECK DOWN.        Do not use on open wounds or open sores.  Avoid contact with your eyes, ears, mouth and genitals (private parts).  Wash genitals (private parts) with your normal soap.  6.  Wash thoroughly, paying special attention to the area where your surgery        will be performed.  7.  Thoroughly rinse your body  with warm water from the neck down.  8.  DO NOT shower/wash with your normal soap after using and rinsing off       the CHG Soap.  9.  Pat yourself dry with a clean towel.            10.  Wear clean pajamas.            11.  Place clean sheets on your bed the night of your first shower and do not        sleep with pets.  Day of Surgery  Do not apply any lotions the morning of surgery.  Please wear clean clothes to the hospital.    Please read over the following fact sheets that you were given. Pain Booklet, Coughing and Deep Breathing and Surgical Site Infection Prevention

## 2014-08-31 MED ORDER — CEFAZOLIN SODIUM-DEXTROSE 2-3 GM-% IV SOLR
2.0000 g | INTRAVENOUS | Status: AC
Start: 2014-09-01 — End: 2014-09-01
  Administered 2014-09-01: 2 g via INTRAVENOUS
  Filled 2014-08-31: qty 50

## 2014-09-01 ENCOUNTER — Inpatient Hospital Stay (HOSPITAL_COMMUNITY)
Admission: AD | Admit: 2014-09-01 | Discharge: 2014-09-03 | DRG: 355 | Disposition: A | Discharge status: Home / Self Care | Payer: 59 | Source: Ambulatory Visit | Attending: Surgery | Admitting: Surgery

## 2014-09-01 ENCOUNTER — Encounter (HOSPITAL_COMMUNITY): Payer: Self-pay | Admitting: Surgery

## 2014-09-01 ENCOUNTER — Ambulatory Visit (HOSPITAL_COMMUNITY): Payer: 59 | Admitting: Anesthesiology

## 2014-09-01 ENCOUNTER — Encounter (HOSPITAL_COMMUNITY)
Admission: AD | Disposition: A | Discharge status: Home / Self Care | Payer: Self-pay | Source: Ambulatory Visit | Attending: Surgery

## 2014-09-01 DIAGNOSIS — K449 Diaphragmatic hernia without obstruction or gangrene: Secondary | ICD-10-CM | POA: Diagnosis present

## 2014-09-01 DIAGNOSIS — K439 Ventral hernia without obstruction or gangrene: Secondary | ICD-10-CM | POA: Diagnosis present

## 2014-09-01 DIAGNOSIS — K432 Incisional hernia without obstruction or gangrene: Principal | ICD-10-CM | POA: Diagnosis present

## 2014-09-01 DIAGNOSIS — K219 Gastro-esophageal reflux disease without esophagitis: Secondary | ICD-10-CM | POA: Diagnosis present

## 2014-09-01 DIAGNOSIS — Z6839 Body mass index (BMI) 39.0-39.9, adult: Secondary | ICD-10-CM

## 2014-09-01 DIAGNOSIS — M797 Fibromyalgia: Secondary | ICD-10-CM | POA: Diagnosis present

## 2014-09-01 DIAGNOSIS — I10 Essential (primary) hypertension: Secondary | ICD-10-CM | POA: Diagnosis present

## 2014-09-01 HISTORY — PX: VENTRAL HERNIA REPAIR: SHX424

## 2014-09-01 LAB — CBC
HCT: 36.3 % (ref 36.0–46.0)
Hemoglobin: 11.8 g/dL — ABNORMAL LOW (ref 12.0–15.0)
MCH: 28.3 pg (ref 26.0–34.0)
MCHC: 32.5 g/dL (ref 30.0–36.0)
MCV: 87.1 fL (ref 78.0–100.0)
PLATELETS: 238 10*3/uL (ref 150–400)
RBC: 4.17 MIL/uL (ref 3.87–5.11)
RDW: 13.5 % (ref 11.5–15.5)
WBC: 11.8 10*3/uL — AB (ref 4.0–10.5)

## 2014-09-01 LAB — CREATININE, SERUM
CREATININE: 0.89 mg/dL (ref 0.44–1.00)
GFR calc Af Amer: >60 mL/min (ref >60)

## 2014-09-01 SURGERY — REPAIR, HERNIA, VENTRAL, LAPAROSCOPIC
Anesthesia: General | Site: Abdomen

## 2014-09-01 MED ORDER — ENOXAPARIN SODIUM 40 MG/0.4ML ~~LOC~~ SOLN
40.0000 mg | SUBCUTANEOUS | Status: DC
  Filled 2014-09-01 (×2): qty 0.4

## 2014-09-01 MED ORDER — PROPOFOL 10 MG/ML IV BOLUS
INTRAVENOUS | Status: DC | PRN
  Administered 2014-09-01: 150 mg via INTRAVENOUS
  Administered 2014-09-01: 20 mg via INTRAVENOUS
  Administered 2014-09-01: 30 mg via INTRAVENOUS

## 2014-09-01 MED ORDER — HYDROMORPHONE HCL 1 MG/ML IJ SOLN
INTRAMUSCULAR | Status: AC
  Filled 2014-09-01: qty 1

## 2014-09-01 MED ORDER — LIDOCAINE HCL (CARDIAC) 20 MG/ML IV SOLN
INTRAVENOUS | Status: DC | PRN
  Administered 2014-09-01: 20 mg via INTRAVENOUS
  Administered 2014-09-01: 80 mg via INTRAVENOUS

## 2014-09-01 MED ORDER — SUCCINYLCHOLINE CHLORIDE 20 MG/ML IJ SOLN
INTRAMUSCULAR | Status: AC
  Filled 2014-09-01: qty 1

## 2014-09-01 MED ORDER — ESTRADIOL 0.05 MG/24HR TD PTWK: 0.0500 mg | MEDICATED_PATCH | TRANSDERMAL | Status: DC

## 2014-09-01 MED ORDER — ROSUVASTATIN CALCIUM 40 MG PO TABS
40.0000 mg | ORAL_TABLET | Freq: Every day | ORAL | Status: DC
  Administered 2014-09-01 – 2014-09-03 (×3): 40 mg via ORAL
  Filled 2014-09-01 (×3): qty 1

## 2014-09-01 MED ORDER — GLYCOPYRROLATE 0.2 MG/ML IJ SOLN
INTRAMUSCULAR | Status: AC
  Filled 2014-09-01: qty 2

## 2014-09-01 MED ORDER — PROPOFOL 10 MG/ML IV BOLUS
INTRAVENOUS | Status: AC
  Filled 2014-09-01: qty 20

## 2014-09-01 MED ORDER — TRAMADOL HCL 50 MG PO TABS: 50.0000 mg | ORAL_TABLET | Freq: Four times a day (QID) | ORAL | Status: DC | PRN

## 2014-09-01 MED ORDER — CEFAZOLIN SODIUM 1-5 GM-% IV SOLN
1.0000 g | Freq: Four times a day (QID) | INTRAVENOUS | Status: AC
  Administered 2014-09-01 – 2014-09-02 (×3): 1 g via INTRAVENOUS
  Filled 2014-09-01 (×3): qty 50

## 2014-09-01 MED ORDER — ONDANSETRON HCL 4 MG/2ML IJ SOLN
INTRAMUSCULAR | Status: DC | PRN
  Administered 2014-09-01: 4 mg via INTRAVENOUS

## 2014-09-01 MED ORDER — POTASSIUM CHLORIDE IN NACL 20-0.9 MEQ/L-% IV SOLN
INTRAVENOUS | Status: DC
  Administered 2014-09-01 – 2014-09-02 (×2): via INTRAVENOUS
  Filled 2014-09-01 (×2): qty 1000

## 2014-09-01 MED ORDER — FLUOXETINE HCL 20 MG PO CAPS
20.0000 mg | ORAL_CAPSULE | Freq: Every day | ORAL | Status: DC
  Administered 2014-09-02 – 2014-09-03 (×2): 20 mg via ORAL
  Filled 2014-09-01 (×2): qty 1

## 2014-09-01 MED ORDER — HYDROMORPHONE HCL 1 MG/ML IJ SOLN
0.2500 mg | INTRAMUSCULAR | Status: DC | PRN
  Administered 2014-09-01 (×4): 0.5 mg via INTRAVENOUS

## 2014-09-01 MED ORDER — NEOSTIGMINE METHYLSULFATE 10 MG/10ML IV SOLN
INTRAVENOUS | Status: AC
  Filled 2014-09-01: qty 1

## 2014-09-01 MED ORDER — KETOROLAC TROMETHAMINE 30 MG/ML IJ SOLN
INTRAMUSCULAR | Status: AC
  Administered 2014-09-01: 30 mg
  Filled 2014-09-01: qty 1

## 2014-09-01 MED ORDER — FENTANYL CITRATE (PF) 250 MCG/5ML IJ SOLN
INTRAMUSCULAR | Status: AC
  Filled 2014-09-01: qty 5

## 2014-09-01 MED ORDER — ARTIFICIAL TEARS OP OINT
TOPICAL_OINTMENT | OPHTHALMIC | Status: AC
  Filled 2014-09-01: qty 3.5

## 2014-09-01 MED ORDER — MIDAZOLAM HCL 5 MG/5ML IJ SOLN
INTRAMUSCULAR | Status: DC | PRN
  Administered 2014-09-01: 2 mg via INTRAVENOUS

## 2014-09-01 MED ORDER — LIDOCAINE HCL (CARDIAC) 20 MG/ML IV SOLN
INTRAVENOUS | Status: AC
  Filled 2014-09-01: qty 5

## 2014-09-01 MED ORDER — METHOCARBAMOL 500 MG PO TABS
500.0000 mg | ORAL_TABLET | Freq: Two times a day (BID) | ORAL | Status: DC | PRN
  Administered 2014-09-02: 500 mg via ORAL
  Filled 2014-09-01 (×2): qty 1

## 2014-09-01 MED ORDER — LORAZEPAM 0.5 MG PO TABS
0.2500 mg | ORAL_TABLET | Freq: Two times a day (BID) | ORAL | Status: DC | PRN
  Administered 2014-09-03: 0.5 mg via ORAL
  Filled 2014-09-01: qty 1

## 2014-09-01 MED ORDER — EPHEDRINE SULFATE 50 MG/ML IJ SOLN
INTRAMUSCULAR | Status: DC | PRN
  Administered 2014-09-01: 15 mg via INTRAVENOUS

## 2014-09-01 MED ORDER — NEOSTIGMINE METHYLSULFATE 10 MG/10ML IV SOLN
INTRAVENOUS | Status: DC | PRN
Start: 2014-09-01 — End: 2014-09-01
  Administered 2014-09-01: 3 mg via INTRAVENOUS

## 2014-09-01 MED ORDER — ACETAMINOPHEN 500 MG PO TABS
1000.0000 mg | ORAL_TABLET | Freq: Four times a day (QID) | ORAL | Status: AC
  Administered 2014-09-01 – 2014-09-02 (×4): 1000 mg via ORAL
  Filled 2014-09-01 (×4): qty 2

## 2014-09-01 MED ORDER — MIDAZOLAM HCL 2 MG/2ML IJ SOLN
INTRAMUSCULAR | Status: AC
  Filled 2014-09-01: qty 4

## 2014-09-01 MED ORDER — 0.9 % SODIUM CHLORIDE (POUR BTL) OPTIME
TOPICAL | Status: DC | PRN
  Administered 2014-09-01: 1000 mL

## 2014-09-01 MED ORDER — LACTATED RINGERS IV SOLN
INTRAVENOUS | Status: DC | PRN
  Administered 2014-09-01 (×2): via INTRAVENOUS

## 2014-09-01 MED ORDER — ARTIFICIAL TEARS OP OINT
TOPICAL_OINTMENT | OPHTHALMIC | Status: DC | PRN
  Administered 2014-09-01: 1 via OPHTHALMIC

## 2014-09-01 MED ORDER — ESTRADIOL 0.05 MG/24HR TD PTWK
0.0500 mg | MEDICATED_PATCH | TRANSDERMAL | Status: DC
  Administered 2014-09-01: 0.05 mg via TRANSDERMAL
  Filled 2014-09-01 (×2): qty 1

## 2014-09-01 MED ORDER — IRBESARTAN 300 MG PO TABS
300.0000 mg | ORAL_TABLET | Freq: Every day | ORAL | Status: DC
  Administered 2014-09-02 – 2014-09-03 (×2): 300 mg via ORAL
  Filled 2014-09-01 (×3): qty 1

## 2014-09-01 MED ORDER — PHENYLEPHRINE HCL 10 MG/ML IJ SOLN
INTRAMUSCULAR | Status: DC | PRN
  Administered 2014-09-01 (×2): 40 ug via INTRAVENOUS
  Administered 2014-09-01: 80 ug via INTRAVENOUS

## 2014-09-01 MED ORDER — ROCURONIUM BROMIDE 50 MG/5ML IV SOLN
INTRAVENOUS | Status: AC
  Filled 2014-09-01: qty 1

## 2014-09-01 MED ORDER — ROCURONIUM BROMIDE 100 MG/10ML IV SOLN
INTRAVENOUS | Status: DC | PRN
  Administered 2014-09-01: 40 mg via INTRAVENOUS

## 2014-09-01 MED ORDER — KETOROLAC TROMETHAMINE 30 MG/ML IJ SOLN
30.0000 mg | Freq: Four times a day (QID) | INTRAMUSCULAR | Status: DC
  Administered 2014-09-01 – 2014-09-02 (×4): 30 mg via INTRAVENOUS
  Filled 2014-09-01 (×6): qty 1

## 2014-09-01 MED ORDER — PANTOPRAZOLE SODIUM 40 MG PO TBEC
40.0000 mg | DELAYED_RELEASE_TABLET | Freq: Every day | ORAL | Status: DC
  Administered 2014-09-02 – 2014-09-03 (×2): 40 mg via ORAL
  Filled 2014-09-01 (×2): qty 1

## 2014-09-01 MED ORDER — ONDANSETRON HCL 4 MG/2ML IJ SOLN
INTRAMUSCULAR | Status: AC
  Filled 2014-09-01: qty 2

## 2014-09-01 MED ORDER — HYDROMORPHONE HCL 1 MG/ML IJ SOLN
1.0000 mg | INTRAMUSCULAR | Status: DC | PRN
  Administered 2014-09-01: 1 mg via INTRAVENOUS
  Filled 2014-09-01: qty 1

## 2014-09-01 MED ORDER — FENTANYL CITRATE (PF) 100 MCG/2ML IJ SOLN
INTRAMUSCULAR | Status: DC | PRN
  Administered 2014-09-01 (×5): 50 ug via INTRAVENOUS

## 2014-09-01 MED ORDER — ONDANSETRON HCL 4 MG PO TABS
4.0000 mg | ORAL_TABLET | Freq: Four times a day (QID) | ORAL | Status: DC | PRN
Start: 2014-09-01 — End: 2014-09-03
  Administered 2014-09-02 – 2014-09-03 (×2): 4 mg via ORAL
  Filled 2014-09-01 (×2): qty 1

## 2014-09-01 MED ORDER — SODIUM CHLORIDE 0.9 % IJ SOLN
INTRAMUSCULAR | Status: AC
  Filled 2014-09-01: qty 10

## 2014-09-01 MED ORDER — ONDANSETRON HCL 4 MG/2ML IJ SOLN
4.0000 mg | Freq: Four times a day (QID) | INTRAMUSCULAR | Status: DC | PRN
  Administered 2014-09-01: 4 mg via INTRAVENOUS
  Filled 2014-09-01: qty 2

## 2014-09-01 MED ORDER — PROMETHAZINE HCL 25 MG/ML IJ SOLN: 6.2500 mg | INTRAMUSCULAR | Status: DC | PRN

## 2014-09-01 MED ORDER — BUPIVACAINE-EPINEPHRINE 0.25% -1:200000 IJ SOLN
INTRAMUSCULAR | Status: DC | PRN
  Administered 2014-09-01: 30 mL

## 2014-09-01 MED ORDER — METHOCARBAMOL 500 MG PO TABS
ORAL_TABLET | ORAL | Status: AC
  Administered 2014-09-01: 500 mg
  Filled 2014-09-01: qty 1

## 2014-09-01 MED ORDER — CHLORHEXIDINE GLUCONATE 4 % EX LIQD: 1.0000 "application " | Freq: Once | CUTANEOUS | Status: DC

## 2014-09-01 MED ORDER — LORATADINE 10 MG PO TABS: 10.0000 mg | ORAL_TABLET | Freq: Every day | ORAL | Status: DC | PRN

## 2014-09-01 MED ORDER — BUPIVACAINE-EPINEPHRINE (PF) 0.25% -1:200000 IJ SOLN
INTRAMUSCULAR | Status: AC
  Filled 2014-09-01: qty 30

## 2014-09-01 MED ORDER — GLYCOPYRROLATE 0.2 MG/ML IJ SOLN
INTRAMUSCULAR | Status: DC | PRN
  Administered 2014-09-01: 0.4 mg via INTRAVENOUS
  Administered 2014-09-01: 0.2 mg via INTRAVENOUS

## 2014-09-01 SURGICAL SUPPLY — 59 items
APPLIER CLIP LOGIC TI 5 (MISCELLANEOUS) IMPLANT
APPLIER CLIP ROT 10 11.4 M/L (STAPLE)
BENZOIN TINCTURE PRP APPL 2/3 (GAUZE/BANDAGES/DRESSINGS) ×3 IMPLANT
BINDER ABD UNIV 12 45-62 (WOUND CARE) IMPLANT
BINDER ABDOMINAL 12 ML 46-62 (SOFTGOODS) ×3 IMPLANT
BINDER ABDOMINAL 46IN 62IN (WOUND CARE)
BLADE SURG ROTATE 9660 (MISCELLANEOUS) IMPLANT
CANISTER SUCTION 2500CC (MISCELLANEOUS) IMPLANT
CHLORAPREP W/TINT 26ML (MISCELLANEOUS) ×3 IMPLANT
CLIP APPLIE ROT 10 11.4 M/L (STAPLE) IMPLANT
CLOSURE WOUND 1/2 X4 (GAUZE/BANDAGES/DRESSINGS) ×1
COVER SURGICAL LIGHT HANDLE (MISCELLANEOUS) ×3 IMPLANT
DECANTER SPIKE VIAL GLASS SM (MISCELLANEOUS) IMPLANT
DEVICE RELIATACK FIXATION (MISCELLANEOUS) ×3 IMPLANT
DEVICE SECURE STRAP 25 ABSORB (INSTRUMENTS) ×3 IMPLANT
DEVICE TROCAR PUNCTURE CLOSURE (ENDOMECHANICALS) ×3 IMPLANT
DRAPE LAPAROSCOPIC ABDOMINAL (DRAPES) ×3 IMPLANT
DRSG TEGADERM 2-3/8X2-3/4 SM (GAUZE/BANDAGES/DRESSINGS) ×6 IMPLANT
DRSG TEGADERM 4X4.75 (GAUZE/BANDAGES/DRESSINGS) ×3 IMPLANT
ELECT REM PT RETURN 9FT ADLT (ELECTROSURGICAL) ×3
ELECTRODE REM PT RTRN 9FT ADLT (ELECTROSURGICAL) ×1 IMPLANT
FILTER SMOKE EVAC LAPAROSHD (FILTER) IMPLANT
GAUZE SPONGE 2X2 8PLY STRL LF (GAUZE/BANDAGES/DRESSINGS) ×1 IMPLANT
GLOVE BIO SURGEON STRL SZ7 (GLOVE) ×3 IMPLANT
GLOVE BIOGEL PI IND STRL 7.0 (GLOVE) ×1 IMPLANT
GLOVE BIOGEL PI IND STRL 7.5 (GLOVE) ×2 IMPLANT
GLOVE BIOGEL PI INDICATOR 7.0 (GLOVE) ×2
GLOVE BIOGEL PI INDICATOR 7.5 (GLOVE) ×4
GLOVE ECLIPSE 7.5 STRL STRAW (GLOVE) ×6 IMPLANT
GLOVE SURG SS PI 7.0 STRL IVOR (GLOVE) ×3 IMPLANT
GOWN STRL REUS W/ TWL LRG LVL3 (GOWN DISPOSABLE) ×3 IMPLANT
GOWN STRL REUS W/TWL LRG LVL3 (GOWN DISPOSABLE) ×6
KIT BASIN OR (CUSTOM PROCEDURE TRAY) ×3 IMPLANT
KIT ROOM TURNOVER OR (KITS) ×3 IMPLANT
LIQUID BAND (GAUZE/BANDAGES/DRESSINGS) ×3 IMPLANT
MARKER SKIN DUAL TIP RULER LAB (MISCELLANEOUS) ×3 IMPLANT
MESH VENTRALIGHT ST 7X9N (Mesh General) ×3 IMPLANT
NEEDLE SPNL 22GX3.5 QUINCKE BK (NEEDLE) ×3 IMPLANT
NS IRRIG 1000ML POUR BTL (IV SOLUTION) ×3 IMPLANT
PAD ARMBOARD 7.5X6 YLW CONV (MISCELLANEOUS) ×6 IMPLANT
RELOAD RELIATACK 5 (MISCELLANEOUS) ×9 IMPLANT
SCALPEL HARMONIC ACE (MISCELLANEOUS) IMPLANT
SCISSORS LAP 5X35 DISP (ENDOMECHANICALS) ×3 IMPLANT
SET IRRIG TUBING LAPAROSCOPIC (IRRIGATION / IRRIGATOR) IMPLANT
SHEARS HARMONIC ACE PLUS 36CM (ENDOMECHANICALS) ×3 IMPLANT
SLEEVE ENDOPATH XCEL 5M (ENDOMECHANICALS) ×3 IMPLANT
SPONGE GAUZE 2X2 STER 10/PKG (GAUZE/BANDAGES/DRESSINGS) ×2
STRIP CLOSURE SKIN 1/2X4 (GAUZE/BANDAGES/DRESSINGS) ×2 IMPLANT
SUT MNCRL AB 4-0 PS2 18 (SUTURE) ×3 IMPLANT
SUT NOVA NAB GS-21 0 18 T12 DT (SUTURE) ×6 IMPLANT
TOWEL OR 17X24 6PK STRL BLUE (TOWEL DISPOSABLE) ×3 IMPLANT
TOWEL OR 17X26 10 PK STRL BLUE (TOWEL DISPOSABLE) ×3 IMPLANT
TRAY FOLEY CATH 14FRSI W/METER (CATHETERS) ×3 IMPLANT
TRAY LAPAROSCOPIC MC (CUSTOM PROCEDURE TRAY) ×3 IMPLANT
TROCAR XCEL BLUNT TIP 100MML (ENDOMECHANICALS) IMPLANT
TROCAR XCEL NON-BLD 11X100MML (ENDOMECHANICALS) ×3 IMPLANT
TROCAR XCEL NON-BLD 5MMX100MML (ENDOMECHANICALS) ×3 IMPLANT
TUBING INSUFFLATION (TUBING) ×3 IMPLANT
WATER STERILE IRR 1000ML POUR (IV SOLUTION) IMPLANT

## 2014-09-01 NOTE — Transfer of Care (Signed)
Immediate Anesthesia Transfer of Care Note  Patient: Charlotte Fox  Procedure(s) Performed: Procedure(s): LAPAROSCOPIC VENTRAL HERNIA REPAIR WITH MESH (N/A)  Patient Location: PACU  Anesthesia Type:General  Level of Consciousness: awake, alert , oriented and sedated  Airway & Oxygen Therapy: Patient Spontanous Breathing and Patient connected to nasal cannula oxygen  Post-op Assessment: Report given to RN, Post -op Vital signs reviewed and stable and Patient moving all extremities  Post vital signs: Reviewed and stable  Last Vitals:  Filed Vitals:   09/01/14 0917  BP:   Pulse:   Temp: 36.5 C  Resp:     Complications: No apparent anesthesia complications

## 2014-09-01 NOTE — H&P (View-Only) (Signed)
History of Present Illness  Patient words: Hernia.  The patient is a 49 year old female who presents with an incisional hernia. Referred by Dr. Gweneth DimitriWendy McNeill for evaluation of ventral hernia.  This is a 49 year old female who presents with approximately 2 years of intermittent worsening epigastric abdominal pain that spreads across her upper abdomen, mostly towards the left. She has a fairly complex past surgical history. In January 2014 she was referred to gynecologic oncology at [facility] (Dr. Kyla BalzarineSoper) for a large adnexal mass. She underwent hysterectomy, appendectomy, and probable sigmoid colectomy. We have no records from that hospitalization at the time of this visit. The mass turned out to be benign. She underwent colonoscopy in May 2014 by Dr. Charlott RakesVincent Schooler with [facility].  Her symptoms have become more frequent and seemed to be related to eating. She reports associated nausea and diarrhea with these episodes. She underwent a CT scan which showed two small incisional hernias as well as an abnormal appearing gallbladder. She underwent laparoscopic cholecystectomy with intraoperative cholangiogram on 05/21/14. She is doing quite well. Abdominal pain is resolved. She has resumed a regular diet. No issues with diarrhea. She had minimal pain after surgery.      CLINICAL DATA: Upper and left upper quadrant abdominal pain.  EXAM: CT ABDOMEN AND PELVIS WITH CONTRAST  TECHNIQUE: Multidetector CT imaging of the abdomen and pelvis was performed using the standard protocol following bolus administration of intravenous contrast.  CONTRAST: 100mL OMNIPAQUE IOHEXOL 300 MG/ML SOLN  COMPARISON: 01/18/2012  FINDINGS: Lower chest: There is a 3 mm nodule in the anterior periphery of the right middle lobe which is benign and unchanged from 01/18/2012.  Hepatobiliary: There is a suggestion of gallbladder mural thickening. There is no bile duct dilatation. There are normal appearances  of the liver.  Pancreas: Normal  Spleen: Normal  Adrenals/Urinary Tract: The adrenals and kidneys are normal in appearance. Collecting systems and ureters appear normal. No urinary calculi are evident.  Stomach/Bowel: There are unremarkable appearances of the stomach and small bowel. There is prior sigmoid resection with unremarkable appearances of the anastomosis. A portion of the mid transverse colon protrudes into a wide-mouth umbilical hernia without obstruction or inflammation. The appendix is not visible and may have been removed. There is no CT evidence of appendicitis, diverticulitis or other acute inflammatory process involving the bowel.  Vascular/Lymphatic: The abdominal aorta is normal in caliber. No significant atherosclerotic disease is evident. There is no adenopathy in the abdomen or pelvis.  Reproductive: Prior hysterectomy. Neither ovary is visible.  Other: Wide-mouth umbilical hernia containing a portion of nonobstructed mid transverse colon. There also is an infraumbilical ventral hernia containing only fat.  Musculoskeletal: No significant bony abnormalities.  IMPRESSION: 1. The gallbladder wall appears mildly irregular, possibly thickened. This is not well characterized by CT. Consider right upper quadrant sonography for optimal characterization. 2. Umbilical hernia containing a portion of the mid transverse colon without obstruction or inflammation. 3. Fat containing infraumbilical ventral hernia 4. Unremarkable appearances of the sigmoid anastomosis   Electronically Signed By: Ellery Plunkaniel R Mitchell M.D. On: 02/24/2014 19:32    Allergies  Erythromycin *MACROLIDES* Azithromycin *MACROLIDES* Allegra *ANTIHISTAMINES* Sulfa Antibiotics Levaquin *FLUOROQUINOLONES* Penicillins Vicodin *ANALGESICS - OPIOID*  Medication History  Tylenol Cold (30-325-15MG  Tablet, Oral) Active. Lipitor (80MG  Tablet, Oral) Active. LORazepam (0.5MG   Tablet, Oral) Active. TraMADol HCl (50MG  Tablet, Oral) Active. Voltaren (1% Gel, Transdermal) Active. Omeprazole (20MG  Capsule DR, Oral) Active. Ketoprofen (75MG  Capsule, Oral) Active. FLUoxetine HCl (20MG  Capsule, Oral) Active. Estradiol (0.05MG /24HR  Patch Weekly, Transdermal) Active. Cholecalciferol (10000UNIT Capsule, Oral) Active. Cyclobenzaprine HCl (10MG Tablet, Oral) Active. Phenergan (25MG Tablet, Oral) Active. Micardis HCT (80-12.5MG Tablet, Oral) Active. Medications Reconciled  Vitals Weight: 244 lb Height: 66in Body Surface Area: 2.27 m Body Mass Index: 39.38 kg/m Temp.: 98.2F(Oral)  Pulse: 91 (Regular)  Resp.: 17 (Unlabored)  BP: 130/70 (Sitting, Left Arm, Standard)    Physical Exam  The physical exam findings are as follows: Note:WDWN in NAD Abd - incisions well-healed with no sign of infection Palpable periumbilical hernia - not reducible; non-tender    Assessment & Plan  CHRONIC CHOLECYSTITIS WITHOUT CALCULUS (575.11  K81.1) VENTRAL INCISIONAL HERNIA WITHOUT OBSTRUCTION OR GANGRENE (553.21  K43.2) Current Plans  Schedule for Surgery - Laparoscopic ventral hernia repair with mesh. The surgical procedure has been discussed with the patient. Potential risks, benefits, alternative treatments, and expected outcomes have been explained. All of the patient's questions at this time have been answered. The likelihood of reaching the patient's treatment goal is good. The patient understand the proposed surgical procedure and wishes to proceed.  She is doing from the standpoint of her gallbladder surgery. We will plan her hernia repair later this summer/ fall. She wants to think about possibly August.  Paisleigh Maroney K. Ferrell Flam, MD, FACS [facility]  General/ Trauma Surgery  08/18/2014 9:40 PM  

## 2014-09-01 NOTE — Anesthesia Procedure Notes (Addendum)
Procedure Name: Intubation Date/Time: 09/01/2014 7:33 AM Performed by: Fransisca Kaufmann Pre-anesthesia Checklist: Patient identified, Emergency Drugs available, Suction available, Patient being monitored and Timeout performed Patient Re-evaluated:Patient Re-evaluated prior to inductionOxygen Delivery Method: Circle system utilized Preoxygenation: Pre-oxygenation with 100% oxygen Intubation Type: IV induction Ventilation: Mask ventilation without difficulty and Oral airway inserted - appropriate to patient size Laryngoscope Size: Hyacinth Meeker and 2 Grade View: Grade I Tube type: Oral Tube size: 7.5 mm Number of attempts: 1 Airway Equipment and Method: Stylet Placement Confirmation: ETT inserted through vocal cords under direct vision,  positive ETCO2 and breath sounds checked- equal and bilateral Secured at: 22 cm Tube secured with: Tape Dental Injury: Teeth and Oropharynx as per pre-operative assessment

## 2014-09-01 NOTE — Interval H&P Note (Signed)
History and Physical Interval Note:  09/01/2014 7:11 AM  Charlotte Fox  has presented today for surgery, with the diagnosis of Ventral Hernia  The various methods of treatment have been discussed with the patient and family. After consideration of risks, benefits and other options for treatment, the patient has consented to  Procedure(s): LAPAROSCOPIC VENTRAL HERNIA REPAIR WITH MESH (N/A) as a surgical intervention .  The patient's history has been reviewed, patient examined, no change in status, stable for surgery.  I have reviewed the patient's chart and labs.  Questions were answered to the patient's satisfaction.     Doneta Bayman K.

## 2014-09-01 NOTE — Op Note (Signed)
Laparoscopic Ventral Hernia Repair Procedure Note  Indications: Symptomatic ventral hernias  Pre-operative Diagnosis: ventral hernia - umbilical and infraumbilical  Post-operative Diagnosis: ventral hernia  Surgeon: Buffy Ehler K.   Assistants: Myrtie Soman RNFA  Anesthesia: General endotracheal anesthesia  ASA Class: 1  Procedure Details  The patient was seen in the Holding Room. The risks, benefits, complications, treatment options, and expected outcomes were discussed with the patient. The possibilities of reaction to medication, pulmonary aspiration, perforation of viscus, bleeding, recurrent infection, the need for additional procedures, failure to diagnose a condition, and creating a complication requiring transfusion or operation were discussed with the patient. The patient concurred with the proposed plan, giving informed consent.  The site of surgery properly noted/marked. The patient was taken to the operating room, identified as Charlotte Fox and the procedure verified as laparoscopic ventral hernia repair with mesh. A Time Out was held and the above information confirmed.    The patient was placed supine.  After establishing general anesthesia, a Foley catheter was placed.  The abdomen was prepped with Chloraprep and draped in standard fashion.  A 5 mm Optiview was used the cannulate the peritoneal cavity in the left upper quadrant below the costal margin.  Pneumoperitoneum was obtained by insufflating CO2, maintaining a maximum pressure of 15 mmHg.  The 5 mm 30-degree laparoscopic was inserted.  There were significant omental adhesions to the anterior abdominal wall in and around the hernia defects near the umbilicus.  An 11-mm port was placed in the left anterior axillary line at the level of the umbilicus.  Another 5-mm port was placed in the left lower quadrant.  Harmonic scalpel and gentle traction were used to dissect the omental adhesions away from the anterior  abdominal wall.  We cleared the entire abdominal wall and were able to visualize two fascial defects.  The larger defect was at the umbilicus and the smaller defect was below the umbilicus.  We used a spinal needle to identify the extent of the hernia defects.  This covered an area of about 15 x 10 cms.  We selected a 22x15cm piece of Bard Ventralight mesh.  We placed eight stay sutures of 0 Novofil around the edges of the mesh.  The mesh was then rolled up and inserted through the 11 mm port site.  The mesh was then unrolled.  The stay sutures were then pulled up through small stab incisions using the Endo-close device.  This deployed the mesh widely over the fascial defects.  The stay sutures were then tied down.  The Reliatack device was then used to tack down the edges of the mesh at 1 cm intervals circumferentially. We used a total of 45 tacks.  We placed an additional 5 mm port site on the right to allow proper placement of the tacks. We placed a few tacks inside the outer ring of tacks.  We inspected for hemostasis.  The fascial defect at the 11 mm port site was closed with a 0 Vicryl using the Endoclose device.  Pneumoperitoneum was then released as we removed the remainder of the trocars.  The port sites were closed with 4-0 Monocryl.  All of the incisions and stay suture sites were then sealed with Dermabond.  An abdominal binder was placed around the patient's abdomen.  The patient was extubated and brought to the recovery room in stable condition.  All sponge, instrument, and needle counts were correct prior to closure and at the conclusion of the case.   Findings:  Two hernia defects   Estimated Blood Loss:  Minimal         Complications:  None; patient tolerated the procedure well.         Disposition: PACU - hemodynamically stable.         Condition: stable  Wilmon Arms. Corliss Skains, MD, [facility]  General/ Trauma Surgery  09/01/2014 9:07 AM

## 2014-09-01 NOTE — Anesthesia Preprocedure Evaluation (Addendum)
Anesthesia Evaluation  Patient identified by MRN, date of birth, ID band Patient awake    Reviewed: Allergy & Precautions, NPO status   History of Anesthesia Complications (+) history of anesthetic complications  Airway Mallampati: II       Dental  (+) Teeth Intact   Pulmonary neg pulmonary ROS,  breath sounds clear to auscultation        Cardiovascular hypertension, Rhythm:Regular Rate:Normal     Neuro/Psych  Headaches,  Neuromuscular disease CVA    GI/Hepatic hiatal hernia, GERD-  ,  Endo/Other  Morbid obesity  Renal/GU      Musculoskeletal  (+) Arthritis -, Fibromyalgia -  Abdominal   Peds  Hematology   Anesthesia Other Findings   Reproductive/Obstetrics                            Anesthesia Physical Anesthesia Plan  ASA: III  Anesthesia Plan: General   Post-op Pain Management:    Induction: Intravenous  Airway Management Planned: Oral ETT  Additional Equipment:   Intra-op Plan:   Post-operative Plan: Extubation in OR  Informed Consent:   Dental advisory given  Plan Discussed with: CRNA and Surgeon  Anesthesia Plan Comments:        Anesthesia Quick Evaluation

## 2014-09-02 ENCOUNTER — Encounter (HOSPITAL_COMMUNITY): Payer: Self-pay | Admitting: Surgery

## 2014-09-02 DIAGNOSIS — I10 Essential (primary) hypertension: Secondary | ICD-10-CM | POA: Diagnosis present

## 2014-09-02 DIAGNOSIS — Z6839 Body mass index (BMI) 39.0-39.9, adult: Secondary | ICD-10-CM | POA: Diagnosis not present

## 2014-09-02 DIAGNOSIS — K432 Incisional hernia without obstruction or gangrene: Secondary | ICD-10-CM | POA: Diagnosis present

## 2014-09-02 DIAGNOSIS — M797 Fibromyalgia: Secondary | ICD-10-CM | POA: Diagnosis present

## 2014-09-02 DIAGNOSIS — K449 Diaphragmatic hernia without obstruction or gangrene: Secondary | ICD-10-CM | POA: Diagnosis present

## 2014-09-02 DIAGNOSIS — K219 Gastro-esophageal reflux disease without esophagitis: Secondary | ICD-10-CM | POA: Diagnosis present

## 2014-09-02 DIAGNOSIS — R1012 Left upper quadrant pain: Secondary | ICD-10-CM | POA: Diagnosis present

## 2014-09-02 MED ORDER — OXYCODONE-ACETAMINOPHEN 5-325 MG PO TABS
1.0000 | ORAL_TABLET | ORAL | Status: DC | PRN
  Administered 2014-09-02 – 2014-09-03 (×5): 1 via ORAL
  Filled 2014-09-02 (×5): qty 1

## 2014-09-02 NOTE — Progress Notes (Signed)
1 Day Post-Op  Subjective: Patient quite sore when she tries to move.  Still requiring significant IV pain medication Was nauseated Trying to eat solid food this morning.  Objective: Vital signs in last 24 hours: Temp:  [97.6 F (36.4 C)-98.5 F (36.9 C)] 97.7 F (36.5 C) (08/03 0631) Pulse Rate:  [78-95] 81 (08/03 0631) Resp:  [12-18] 16 (08/03 0631) BP: (117-136)/(50-75) 126/75 mmHg (08/03 0631) SpO2:  [96 %-100 %] 98 % (08/03 0631) Weight:  [112.084 kg (247 lb 1.6 oz)] 112.084 kg (247 lb 1.6 oz) (08/02 1700) Last BM Date: 08/31/14  Intake/Output from previous day: 08/02 0701 - 08/03 0700 In: 1996.3 [P.O.:480; I.V.:1466.3; IV Piggyback:50] Out: 260 [Urine:250; Blood:10] Intake/Output this shift:    General appearance: alert, cooperative and no distress Resp: clear to auscultation bilaterally Cardio: regular rate and rhythm, S1, S2 normal, no murmur, click, rub or gallop GI: soft, tender to palpation of abdominal muscles Incisions - dermabond intact; no drainage  Lab Results:   Recent Labs  09/01/14 1337  WBC 11.8*  HGB 11.8*  HCT 36.3  PLT 238   BMET  Recent Labs  09/01/14 1337  CREATININE 0.89   PT/INR No results for input(s): LABPROT, INR in the last 72 hours. ABG No results for input(s): PHART, HCO3 in the last 72 hours.  Invalid input(s): PCO2, PO2  Studies/Results: No results found.  Anti-infectives: Anti-infectives    Start     Dose/Rate Route Frequency Ordered Stop   09/01/14 1400  ceFAZolin (ANCEF) IVPB 1 g/50 mL premix     1 g 100 mL/hr over 30 Minutes Intravenous Every 6 hours 09/01/14 1120 09/02/14 0249   09/01/14 0715  ceFAZolin (ANCEF) IVPB 2 g/50 mL premix    Comments:  OK to use Ancef.  There is no need to call me again about this.  08/18/14   2 g 100 mL/hr over 30 Minutes Intravenous To [facility] 08/31/14 1324 09/01/14 0735      Assessment/Plan: s/p Procedure(s): LAPAROSCOPIC VENTRAL HERNIA REPAIR WITH MESH  (N/A) Advance diet PO Percocet  Use IV pain medication only for breakthrough pain Hopefully home tomorrow.      Tuleen Mandelbaum K. 09/02/2014

## 2014-09-02 NOTE — Progress Notes (Signed)
Pt's IV began hurting and appeared to be infiltrated. Stopped IVF and removed IV immediately. Tolerating po's very well. Pt requested no more sticks since she was leaving tomorrow. Attempted to notify MD but no answer. Pt VSS. No issues at this time. Educated pt and risks explained. Will continue to monitor pt closely. Jillyn Hidden, RN

## 2014-09-03 MED ORDER — OXYCODONE-ACETAMINOPHEN 5-325 MG PO TABS: 1.0000 | ORAL_TABLET | ORAL | Status: DC | PRN

## 2014-09-03 NOTE — Discharge Instructions (Signed)
[facility]      [facility] [phone]  OPEN ABDOMINAL SURGERY: POST OP INSTRUCTIONS  Always review your discharge instruction sheet given to you by the facility where your surgery was performed.  IF YOU HAVE DISABILITY OR FAMILY LEAVE FORMS, YOU MUST BRING THEM TO THE OFFICE FOR PROCESSING.  PLEASE DO NOT GIVE THEM TO YOUR DOCTOR.  1. A prescription for pain medication may be given to you upon discharge.  Take your pain medication as prescribed, if needed.  If narcotic pain medicine is not needed, then you may take acetaminophen (Tylenol) or ibuprofen (Advil) as needed. 2. Take your usually prescribed medications unless otherwise directed. 3. If you need a refill on your pain medication, please contact your pharmacy. They will contact our office to request authorization.  Prescriptions will not be filled after 5pm or on week-ends. 4. You should follow a light diet the first few days after arrival home, such as soup and crackers, pudding, etc.unless your doctor has advised otherwise. A high-fiber, low fat diet can be resumed as tolerated.   Be sure to include lots of fluids daily. Most patients will experience some swelling and bruising on the chest and neck area.  Ice packs will help.  Swelling and bruising can take several days to resolve 5. Most patients will experience some swelling and bruising in the area of the incision. Ice pack will help. Swelling and bruising can take several days to resolve..  6. It is common to experience some constipation if taking pain medication after surgery.  Increasing fluid intake and taking a stool softener will usually help or prevent this problem from occurring.  A mild laxative (Milk of Magnesia or Miralax) should be taken according to package directions if there are no bowel movements after 48 hours. 7.  You may have steri-strips (small skin tapes) in place directly over the incision.  These strips should be left on the skin for 7-10 days.  If your  surgeon used skin glue on the incision, you may shower in 24 hours.  The glue will flake off over the next 2-3 weeks.  Any sutures or staples will be removed at the office during your follow-up visit. You may find that a light gauze bandage over your incision may keep your staples from being rubbed or pulled. You may shower and replace the bandage daily. 8. ACTIVITIES:  You may resume regular (light) daily activities beginning the next day--such as daily self-care, walking, climbing stairs--gradually increasing activities as tolerated.  You may have sexual intercourse when it is comfortable.  Refrain from any heavy lifting or straining until approved by your doctor. a. You may drive when you no longer are taking prescription pain medication, you can comfortably wear a seatbelt, and you can safely maneuver your car and apply brakes b. Return to Work: ___________________________________ 9. You should see your doctor in the office for a follow-up appointment approximately two weeks after your surgery.  Make sure that you call for this appointment within a day or two after you arrive home to insure a convenient appointment time. OTHER INSTRUCTIONS:  _____________________________________________________________ _____________________________________________________________  WHEN TO CALL YOUR DOCTOR: 1. Fever over 101.0 2. Inability to urinate 3. Nausea and/or vomiting 4. Extreme swelling or bruising 5. Continued bleeding from incision. 6. Increased pain, redness, or drainage from the incision. 7. Difficulty swallowing or breathing 8. Muscle cramping or spasms. 9. Numbness or tingling in hands or feet or around lips.  The clinic staff is available to   answer your questions during regular business hours.  Please don't hesitate to call and ask to speak to one of the nurses if you have concerns.  For further questions, please visit [email or web address]   

## 2014-09-03 NOTE — Discharge Summary (Signed)
Physician Discharge Summary  Patient ID: Charlotte Fox MRN: [id number] DOB/AGE: 09/30/1965 49 y.o.  Admit date: 09/01/2014 Discharge date: 09/03/2014  Admission Diagnoses:  Ventral incisional hernias  Discharge Diagnoses: same Active Problems:   Ventral hernia without obstruction or gangrene   Discharged Condition: good  Hospital Course: Laparoscopic ventral hernia repair with mesh on 09/01/14.  The patient had some pain control issues on POD#1, so she was kept another day.  Pain seems better controlled on current regiment.  She is using her abdominal binder  Consults: None  Significant Diagnostic Studies: none  Treatments: surgery: lap ventral hernia repair with mesh  Discharge Exam: Blood pressure 142/68, pulse 86, temperature 98.4 F (36.9 C), temperature source Oral, resp. rate 17, height  (1.676 m), weight 112.084 kg (247 lb 1.6 oz), last menstrual period 12/31/2011, SpO2 98 %. General appearance: alert, cooperative and no distress GI: soft, + BS; tender at incisions Incisions c/d/i  Disposition: 01-Home or Self Care  Discharge Instructions    Call MD for:  persistant nausea and vomiting    Complete by:  As directed      Call MD for:  redness, tenderness, or signs of infection (pain, swelling, redness, odor or green/yellow discharge around incision site)    Complete by:  As directed      Call MD for:  severe uncontrolled pain    Complete by:  As directed      Call MD for:  temperature >100.4    Complete by:  As directed      Diet general    Complete by:  As directed      Driving Restrictions    Complete by:  As directed   Do not drive while taking pain medications     Increase activity slowly    Complete by:  As directed      May shower / Bathe    Complete by:  As directed      May walk up steps    Complete by:  As directed             Medication List    TAKE these medications        acetaminophen 500 MG tablet  Commonly known as:  TYLENOL  Take  1,000 mg by mouth every 6 (six) hours as needed for mild pain.     cholecalciferol 1000 UNITS tablet  Commonly known as:  VITAMIN D  Take 4,000 Units by mouth daily.     cyclobenzaprine 10 MG tablet  Commonly known as:  FLEXERIL  Take 5-10 mg by mouth 3 (three) times daily as needed for muscle spasms.     diclofenac sodium 1 % Gel  Commonly known as:  VOLTAREN  Apply 1 application topically daily as needed (shoulders, and hip pain).     estradiol 0.05 mg/24hr patch  Commonly known as:  CLIMARA - Dosed in mg/24 hr  Place 0.05 mg onto the skin once a week. Change patch every Tuesday.     FLUoxetine 20 MG capsule  Commonly known as:  PROZAC  Take 20 mg by mouth daily.     hydrocortisone cream 1 %  Apply 1 application topically as needed for itching.     ibuprofen 200 MG tablet  Commonly known as:  ADVIL,MOTRIN  Take 3 tablets (600 mg total) by mouth every 6 (six) hours as needed for headache or mild pain (Mild pain that is not relieved by Tylenol.).     ketoprofen 75 MG  capsule  Commonly known as:  ORUDIS  Take 75 mg by mouth 2 (two) times daily as needed for mild pain.     loratadine 10 MG tablet  Commonly known as:  CLARITIN  Take 10 mg by mouth daily as needed for allergies.     LORazepam 0.5 MG tablet  Commonly known as:  ATIVAN  Take 0.25-0.5 mg by mouth 2 (two) times daily as needed for anxiety or sleep.     methocarbamol 500 MG tablet  Commonly known as:  ROBAXIN  Take 500 mg by mouth 2 (two) times daily as needed for muscle spasms (9a & 2p).     omeprazole 20 MG capsule  Commonly known as:  PRILOSEC  Take 20 mg by mouth daily.     ondansetron 4 MG tablet  Commonly known as:  ZOFRAN  Take 1 tablet (4 mg total) by mouth every 8 (eight) hours as needed for nausea.     oxyCODONE-acetaminophen 5-325 MG per tablet  Commonly known as:  PERCOCET/ROXICET  Take 1 tablet by mouth every 4 (four) hours as needed for moderate pain.     promethazine 25 MG tablet   Commonly known as:  PHENERGAN  Take 25 mg by mouth every 6 (six) hours as needed for nausea or vomiting.     rosuvastatin 40 MG tablet  Commonly known as:  CRESTOR  Take 40 mg by mouth daily.     TART CHERRY ADVANCED PO  Take 1 capsule by mouth daily at 12 noon.     telmisartan 80 MG tablet  Commonly known as:  MICARDIS  Take 80 mg by mouth daily.     traMADol 50 MG tablet  Commonly known as:  ULTRAM  Take 50 mg by mouth every 6 (six) hours as needed for moderate pain.     traMADol 50 MG tablet  Commonly known as:  ULTRAM  Take 1-2 tablets (50-100 mg total) by mouth every 6 (six) hours as needed for moderate pain or severe pain.           Follow-up Information    Follow up with Kazoua Gossen K., MD. Schedule an appointment as soon as possible for a visit in 3 weeks.   Specialty:  General Surgery   Contact information:   [address] [place] [address] [phone]       Signed: Wynona Luna. 09/03/2014, 8:58 AM

## 2014-09-03 NOTE — Progress Notes (Signed)
Pt discharged to home.  Discharge instructions explained to pt.  Pt has no questions at the time of discharge.  Pt states she has all belongings.  Pt has no IV access.  Volunteer wheeled pt out of hospital.

## 2014-09-03 NOTE — Anesthesia Postprocedure Evaluation (Signed)
  Anesthesia Post-op Note  Patient: Charlotte Fox  Procedure(s) Performed: Procedure(s): LAPAROSCOPIC VENTRAL HERNIA REPAIR WITH MESH (N/A)  Patient Location: PACU  Anesthesia Type:General  Level of Consciousness: awake, alert  and sedated  Airway and Oxygen Therapy: Patient Spontanous Breathing  Post-op Pain: mild  Post-op Assessment: Post-op Vital signs reviewed              Post-op Vital Signs: stable  Last Vitals:  Filed Vitals:   09/02/14 2256  BP: 145/78  Pulse: 85  Temp: 36.7 C  Resp: 17    Complications: No apparent anesthesia complications

## 2014-11-06 ENCOUNTER — Other Ambulatory Visit: Payer: Self-pay | Admitting: Surgery

## 2014-11-06 DIAGNOSIS — Z9889 Other specified postprocedural states: Principal | ICD-10-CM

## 2014-11-06 DIAGNOSIS — Z8719 Personal history of other diseases of the digestive system: Secondary | ICD-10-CM

## 2014-11-18 ENCOUNTER — Ambulatory Visit
Admission: RE | Admit: 2014-11-18 | Discharge: 2014-11-18 | Disposition: A | Discharge status: Home / Self Care | Payer: 59 | Source: Ambulatory Visit | Attending: Surgery | Admitting: Surgery

## 2014-11-18 DIAGNOSIS — Z8719 Personal history of other diseases of the digestive system: Secondary | ICD-10-CM

## 2014-11-18 DIAGNOSIS — Z9889 Other specified postprocedural states: Principal | ICD-10-CM

## 2014-11-18 MED ORDER — IOPAMIDOL (ISOVUE-300) INJECTION 61%
125.0000 mL | Freq: Once | INTRAVENOUS | Status: AC | PRN
  Administered 2014-11-18: 125 mL via INTRAVENOUS

## 2014-11-23 NOTE — Progress Notes (Signed)
Quick Note:  Please call the patient and let them know that their study does not show anything that would explain her pain. The hernia repair seems to be intact. I can see her next week if needed.  ______

## 2015-01-11 ENCOUNTER — Other Ambulatory Visit: Payer: Self-pay | Admitting: Family Medicine

## 2015-01-11 DIAGNOSIS — Z1231 Encounter for screening mammogram for malignant neoplasm of breast: Secondary | ICD-10-CM

## 2015-01-29 ENCOUNTER — Ambulatory Visit
Admission: RE | Admit: 2015-01-29 | Discharge: 2015-01-29 | Disposition: A | Discharge status: Home / Self Care | Payer: 59 | Source: Ambulatory Visit | Attending: Family Medicine | Admitting: Family Medicine

## 2015-01-29 DIAGNOSIS — Z1231 Encounter for screening mammogram for malignant neoplasm of breast: Secondary | ICD-10-CM

## 2015-06-05 LAB — HEPATIC FUNCTION PANEL
ALK PHOS: 70 U/L (ref 25–125)
ALT: 15 U/L (ref 7–35)
AST: 20 U/L (ref 13–35)
BILIRUBIN, TOTAL: 0.3 mg/dL

## 2015-06-05 LAB — CBC AND DIFFERENTIAL
HEMATOCRIT: 40 % (ref 36–46)
Hemoglobin: 13.1 g/dL (ref 12.0–16.0)
PLATELETS: 289 10*3/uL (ref 150–399)
WBC: 6.6 10^3/mL

## 2015-06-05 LAB — BASIC METABOLIC PANEL
BUN: 17 mg/dL (ref 4–21)
Creatinine: 0.9 mg/dL (ref 0.5–1.1)
Glucose: 91 mg/dL
Potassium: 4.8 mmol/L (ref 3.4–5.3)
SODIUM: 141 mmol/L (ref 137–147)

## 2015-12-05 NOTE — Progress Notes (Signed)
*IMAGE* Office Visit Note  Patient: Charlotte Fox             Date of Birth: Jun 30, 1965           MRN: [id number]             PCP: Gweneth Dimitri, MD Referring: Gweneth Dimitri, MD Visit Date: 12/10/2015 Occupation:@GUAROCC @    Subjective:  Follow-up On last Visit Date: 06/04/2015===>CHIEF COMPLAINT:  Followup on fibromyalgia syndrome.   History of Present Illness: Charlotte Fox is a 50 y.o. female  On last Visit Date: 06/04/2015===>HISTORY OF PRESENT ILLNESS:  Last seen in our office on 12/11/2014.  Patient states that she is doing well with the fibromyalgia, except she has had increased stress over the last 2 weeks or so.  Overall she does have 18/18 tender points, but her fatigue is rated a 2 on a scale of 0-10, but was a 10 last week.  She is better today.  Otherwise she is doing well with everything overall and does require refills on her Flexeril and Robaxin.  She does taken them as discussed.  Namely, Flexeril at night and Robaxin during the daytime p.r.n.  On last Visit Date: 06/04/2015===>IMPRESSION/PLAN:   1.  Fibromyalgia syndrome.  Active disease with generalized pain and 18/18 tender points.  Doing well except for increased stress over the last 2 weeks. 2.  Fatigue.  Currently she is a 2, but she had a flare this week and she rated it as a 10.  She is better today. 3.  Insomnia.  Flexeril is very helpful. 4.  Bilateral trapezius muscle spasm.  PROCEDURE:  The patient's clinical condition is marked by substantial pain and/or significant functional disability. Other conservative therapy has not provided relief, is contraindicated, or not appropriate. There is a reasonable likelihood that injection will significantly improve the patient's pain and/or functional impairment.   After informed consent was obtained the site was prepped in the usual sterile fashion and injected with 10 mg of Kenalog, mixed with 0.3 mL of 1% lidocaine.  Patient tolerated the procedure well.  There were no  complications. 5.  Refill Flexeril 10 mg at bedtime p.r.n., 90-day supply with a refill. 6.  Refill Robaxin 500 mg, 1 pill in the morning if needed and 1 pill in the afternoon at 2:00 if needed, 90-day supply with 1 refill. 7.  CBC with diff, CMP with GFR due this month. 8.  Return to clinic in 5-6 months.   On today's visit: Patient states that she has been newly diagnosed with allergy induced asthma. Also complaining of left elbow pain within the joint itself. He went supinating pronating and flexing. Patient works at [employer] and unfortunately they are down by a few employees. 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 This is creating a very stressful environment. However she states that she is getting more employees coming up soon and she is happy that she will have less stress less work coming up shortly.  Patient also sees Dr. Ranell Patrick said [facility] orthoped[facility]. She's fallen on her left knee about 2 years ago at work. There was a small tear in the meniscus. Unfortunately that here can grow larger and they're monitoring it over. She is coordinating this with Dr. Ranell Patrick . He is also looking at her osteoarthritis of the knees. Most recently x-rays prove that it is minor osteoarthritis and it does not need to be addressed. However if it does  need to be addressed she will taken up with Dr. Ranell Patrick   Activities of Daily Living:  Patient reports morning stiffness for 30 minutes.   Patient Reports nocturnal pain.  Difficulty dressing/grooming: Reports Difficulty climbing stairs: Reports Difficulty getting out of chair: Reports Difficulty using hands for taps, buttons, cutlery, and/or writing: Reports   Review of Systems  Constitutional: Positive for fatigue.  HENT: Negative for mouth sores and mouth dryness.   Eyes: Negative for dryness.  Respiratory: Negative for shortness of breath.   Gastrointestinal: Negative for constipation and diarrhea.   Musculoskeletal: Positive for myalgias and myalgias.  Skin: Negative for sensitivity to sunlight.  Psychiatric/Behavioral: Positive for sleep disturbance. Negative for decreased concentration.     On last Visit Date: 06/04/2015===>REVIEW OF SYSTEMS:  Systemic review is significant for pain and tenderness, disturbed sleep pattern, fatigue.  No dry eyes or dry  mouth.  No Raynaud's, mucosal ulcers, lymphadenopathy, other rashes, photosensitivity.  No diarrhea, no constipation.  Some morning stiffness.  Occasional trouble with stairs, chair, car, toilet, as well as caps, buttons, cutlery or writing.  The rest of the systems are negative.  Please refer to the rheumatology visit form for details.   PMFS History:  Patient Active Problem List   Diagnosis Date Noted  . Fatigue 12/10/2015  . Fibromyalgia 12/09/2015  . Osteoarthritis of both hands 12/09/2015  . Osteoarthritis of right hip 12/09/2015  . Hypertension 12/09/2015  . Elevated cholesterol 12/09/2015  . Migraines 12/09/2015  . Insomnia 12/09/2015  . History of endometriosis 12/09/2015  . Ventral hernia without obstruction or gangrene 09/01/2014  . Mood disorder (HCC) 10/21/2013  . GERD (gastroesophageal reflux disease) 10/21/2013  . Nausea with vomiting 10/21/2013  . Hypokalemia 10/21/2013  . Cellulitis of right leg 10/20/2013  . Cellulitis of right foot 10/20/2013    Past Medical History:  Diagnosis Date  . Acid reflux   . Anemia   . Arthritis   . Asthma   . Complication of anesthesia    propofol dropped resp during colonoscopy, woke up during procedure  . Depression   . Elevated cholesterol 12/09/2015  . Fibromyalgia   . Headache    hx migraines  . Heart murmur    does not give her any problems  . High cholesterol   . History of endometriosis 12/09/2015  . History of hiatal hernia   . Hypertension   . Insomnia 12/09/2015  . Migraines 12/09/2015  . Osteoarthritis of both hands 12/09/2015  . Osteoarthritis of right hip  12/09/2015   Mild  . Patent foramen ovale    no longer sees cardiology  . Retinal artery occlusion   . Stroke [facility])    retinal artery   several yrs ago,  left eye diminished vision     Family History  Problem Relation Age of Onset  . Hypertension Mother   . Diabetes Mother   . Arthritis Mother    Past Surgical History:  Procedure Laterality Date  . ABDOMINAL HYSTERECTOMY     appendectomy/ colon  . ANKLE SURGERY     at age 64  . APPENDECTOMY    . CHOLECYSTECTOMY N/A 05/21/2014   Procedure: LAPAROSCOPIC CHOLECYSTECTOMY WITH INTRAOPERATIVE CHOLANGIOGRAM;  Surgeon: Manus Rudd, MD;  Location: [facility] OR;  Service: General;  Laterality: N/A;  . COLON SURGERY     removed 5 in colon during hysterectmy [place] due to mass  . COLONOSCOPY     x 2  . KNEE SURGERY    . TOE SURGERY    .  VENTRAL HERNIA REPAIR  09/01/2014   with mesh  . VENTRAL HERNIA REPAIR N/A 09/01/2014   Procedure: LAPAROSCOPIC VENTRAL HERNIA REPAIR WITH MESH;  Surgeon: Manus RuddMatthew Tsuei, MD;  Location: [facility] OR;  Service: General;  Laterality: N/A;   Social History   Social History Narrative  . No narrative on file   On last Visit Date: 06/04/2015===>SOCIAL HISTORY:  Nonsmoker, no alcohol, no coffee, does exercise by walking.  On last Visit Date: 06/04/2015===>CURRENT MEDICATIONS:  Micardis, omeprazole, promethazine, tramadol, fluoxetine, lorazepam, estradiol, Flexeril at night only, Crestor, Voltaren gel, Robaxin in the daytime only p.r.n., supplements.  On last Visit Date: 06/04/2015===>MEDICATION ALLERGIES:  PENICILLIN, VICODIN, SULFA, ERYTHROMYCIN, Z-PAK, LEVAQUIN, ALLEGRA D.   Objective: Vital Signs: BP (!) 146/88 (BP Location: Left Arm, Patient Position: Sitting, Cuff Size: Large)   Pulse 90   Resp 14   Ht 5' 6.5" (1.689 m)   Wt 256 lb (116.1 kg)   LMP 12/31/2011   BMI 40.70 kg/m    Physical Exam  Constitutional: She is oriented to person, place, and time. She appears well-developed and well-nourished.  HENT:   Head: Normocephalic and atraumatic.  Eyes: EOM are normal. Pupils are equal, round, and reactive to light.  Cardiovascular: Normal rate, regular rhythm and normal heart sounds.  Exam reveals no gallop and no friction rub.   No murmur heard. Pulmonary/Chest: Effort normal and breath sounds normal. She has no wheezes. She has no rales.  Abdominal: Soft. Bowel sounds are normal. She exhibits no distension. There is no tenderness. There is no guarding. No hernia.  Musculoskeletal: Normal range of motion. She exhibits no edema, tenderness or deformity.  Lymphadenopathy:    She has no cervical adenopathy.  Neurological: She is alert and oriented to person, place, and time. Coordination normal.  Skin: Skin is warm and dry. Capillary refill takes less than 2 seconds. No rash noted.  Psychiatric: She has a normal mood and affect. Her behavior is normal.     Musculoskeletal Exam:  Full range of motion of all joints  grip strength is equal and strong bilaterally Fibromyalgia tender points are 18 out of 18 positive  CDAI Exam: CDAI Homunculus Exam:   Tenderness:  LUE: ulnohumeral and radiohumeral  Joint Counts:  CDAI Tender Joint count: 1 CDAI Swollen Joint count: 0  Global Assessments:  Patient Global Assessment: 6 Provider Global Assessment: 6  CDAI Calculated Score: 13    Investigation: No additional findings. Last labs from 06/05/2015 shows CMP normal.  Imaging: No results found.  Speciality Comments: No specialty comments available.    Procedures:  No procedures performed Allergies: Erythromycin; Propofol; Sulfa antibiotics; Levaquin [levofloxacin]; Allegra-d [fexofenadine-pseudoephed er]; Azithromycin; Penicillins; and Vicodin [hydrocodone-acetaminophen]   Assessment / Plan: Visit Diagnoses: Fibromyalgia - Plan: COMPLETE METABOLIC PANEL WITH GFR, VITAMIN D 25 Hydroxy (Vit-D Deficiency, Fractures), CBC with Differential/Platelet  Fatigue, unspecified type - Plan:  COMPLETE METABOLIC PANEL WITH GFR, VITAMIN D 25 Hydroxy (Vit-D Deficiency, Fractures), CBC with Differential/Platelet  Insomnia, unspecified type - Plan: COMPLETE METABOLIC PANEL WITH GFR, VITAMIN D 25 Hydroxy (Vit-D Deficiency, Fractures), CBC with Differential/Platelet  Gastroesophageal reflux disease, esophagitis presence not specified  Nausea and vomiting, intractability of vomiting not specified, unspecified vomiting type - Plan: COMPLETE METABOLIC PANEL WITH GFR, VITAMIN D 25 Hydroxy (Vit-D Deficiency, Fractures), CBC with Differential/Platelet   Note that we wanted to CBC with differential CMP with GFR and vitamin D today. Patient works for [employer]. Our office uses [facility]. As a result she'll have no charge if he goes  to [employer] and has her blood drawn there. We've given her the work order for the CBC with differential CMP with GFR and vitamin D and she will take it to [employer] and have it done there.  I'm refilling the patient's Robaxin, Flexeril. She also will use Voltaren gel to the left lateral epicondylar area as discussed in office.  Prescription for epicondylar brace and she can go to [facility] to get that prescription filled. Patient has lateral epicondylitis going on for the last 2-3 weeks. She will use Voltaren gel to this area 3 g up to 3 times a day when necessary. I have given her prescription for the Voltaren gel.  Return to clinic in 6 months  Encourage patient to continue doing exercises.  Patient's fatigue is rated 3 on a scale of 0-10 today.  Patient's fibromyalgia tender points and out of 18 positive.  Patient's fibromyalgia is rated approximately 5-6 on a scale of 0-10.    Orders: Orders Placed This Encounter  Procedures  . COMPLETE METABOLIC PANEL WITH GFR  . VITAMIN D 25 Hydroxy (Vit-D Deficiency, Fractures)  . CBC with Differential/Platelet   Meds ordered this encounter  Medications  . methocarbamol (ROBAXIN) 500 MG tablet    Sig: Take 1 tablet (500 mg  total) by mouth 2 (two) times daily as needed for muscle spasms (9a & 2p).    Dispense:  180 tablet    Refill:  1    Order Specific Question:   Supervising Provider    Answer:   Pollyann SavoyEVESHWAR, SHAILI [[id number]]  . cyclobenzaprine (FLEXERIL) 10 MG tablet    Sig: Take 1 tablet (10 mg total) by mouth at bedtime.    Dispense:  90 tablet    Refill:  1    Order Specific Question:   Supervising Provider    Answer:   Pollyann SavoyEVESHWAR, SHAILI [[id number]]  . cyclobenzaprine (FLEXERIL) 5 MG tablet  . montelukast (SINGULAIR) 10 MG tablet  . diclofenac sodium (VOLTAREN) 1 % GEL    Sig: Voltaren Gel 3 grams to 3 large joints upto TID 3 TUBES with 3 refills    Dispense:  3 Tube    Refill:  3    Voltaren Gel 3 grams to 3 large joints upto TID 3 TUBES with 3 refills    Order Specific Question:   Supervising Provider    Answer:   Vanessa KickEVESHWAR, SHAILI [[id number]]    Face-to-face time spent with patient was 30 minutes. 50% of time was spent in counseling and coordination of care.  Follow-Up Instructions: Return in about 6 months (around 06/08/2016) for Fibromyalga, fatigue, insomnia,.   I examined and evaluated the patient with Tawni PummelNaitik Tiana Sivertson PA. The plan of care was discussed as noted above.  Pollyann SavoyShaili Deveshwar, MD

## 2015-12-09 ENCOUNTER — Encounter: Payer: Self-pay | Admitting: *Deleted

## 2015-12-09 DIAGNOSIS — M797 Fibromyalgia: Secondary | ICD-10-CM

## 2015-12-09 DIAGNOSIS — E7800 Pure hypercholesterolemia, unspecified: Secondary | ICD-10-CM

## 2015-12-09 DIAGNOSIS — Z8742 Personal history of other diseases of the female genital tract: Secondary | ICD-10-CM

## 2015-12-09 DIAGNOSIS — I10 Essential (primary) hypertension: Secondary | ICD-10-CM | POA: Insufficient documentation

## 2015-12-09 DIAGNOSIS — G43909 Migraine, unspecified, not intractable, without status migrainosus: Secondary | ICD-10-CM

## 2015-12-09 DIAGNOSIS — M19042 Primary osteoarthritis, left hand: Secondary | ICD-10-CM

## 2015-12-09 DIAGNOSIS — M19041 Primary osteoarthritis, right hand: Secondary | ICD-10-CM

## 2015-12-09 DIAGNOSIS — M1611 Unilateral primary osteoarthritis, right hip: Secondary | ICD-10-CM

## 2015-12-09 DIAGNOSIS — G4700 Insomnia, unspecified: Secondary | ICD-10-CM

## 2015-12-09 HISTORY — DX: Insomnia, unspecified: G47.00

## 2015-12-09 HISTORY — DX: Pure hypercholesterolemia, unspecified: E78.00

## 2015-12-09 HISTORY — DX: Unilateral primary osteoarthritis, right hip: M16.11

## 2015-12-09 HISTORY — DX: Personal history of other diseases of the female genital tract: Z87.42

## 2015-12-09 HISTORY — DX: Migraine, unspecified, not intractable, without status migrainosus: G43.909

## 2015-12-09 HISTORY — DX: Primary osteoarthritis, right hand: M19.041

## 2015-12-10 ENCOUNTER — Encounter: Payer: Self-pay | Admitting: Rheumatology

## 2015-12-10 ENCOUNTER — Ambulatory Visit (INDEPENDENT_AMBULATORY_CARE_PROVIDER_SITE_OTHER): Payer: 59 | Admitting: Rheumatology

## 2015-12-10 ENCOUNTER — Other Ambulatory Visit: Payer: Self-pay | Admitting: Rheumatology

## 2015-12-10 VITALS — BP 146/88 | HR 90 | Resp 14 | Ht 66.5 in | Wt 256.0 lb

## 2015-12-10 DIAGNOSIS — R5383 Other fatigue: Secondary | ICD-10-CM

## 2015-12-10 DIAGNOSIS — G4700 Insomnia, unspecified: Secondary | ICD-10-CM | POA: Diagnosis not present

## 2015-12-10 DIAGNOSIS — R112 Nausea with vomiting, unspecified: Secondary | ICD-10-CM | POA: Diagnosis not present

## 2015-12-10 DIAGNOSIS — M7712 Lateral epicondylitis, left elbow: Secondary | ICD-10-CM

## 2015-12-10 DIAGNOSIS — M797 Fibromyalgia: Secondary | ICD-10-CM

## 2015-12-10 DIAGNOSIS — K219 Gastro-esophageal reflux disease without esophagitis: Secondary | ICD-10-CM | POA: Diagnosis not present

## 2015-12-10 LAB — COMPLETE METABOLIC PANEL WITH GFR

## 2015-12-10 LAB — CBC WITH DIFFERENTIAL/PLATELET

## 2015-12-10 LAB — VITAMIN D 25 HYDROXY (VIT D DEFICIENCY, FRACTURES)

## 2015-12-10 MED ORDER — DICLOFENAC SODIUM 1 % TD GEL: TRANSDERMAL | 3 refills | Status: DC

## 2015-12-10 MED ORDER — METHOCARBAMOL 500 MG PO TABS
500.0000 mg | ORAL_TABLET | Freq: Two times a day (BID) | ORAL | 1 refills | Status: DC | PRN
Start: 2015-12-10 — End: 2016-01-17

## 2015-12-10 MED ORDER — CYCLOBENZAPRINE HCL 10 MG PO TABS: 10.0000 mg | ORAL_TABLET | Freq: Every day | ORAL | 1 refills | Status: DC

## 2015-12-11 LAB — COMPREHENSIVE METABOLIC PANEL
ALBUMIN: 4.1 g/dL (ref 3.5–5.5)
ALT: 20 IU/L (ref 0–32)
AST: 20 IU/L (ref 0–40)
Albumin/Globulin Ratio: 1.6 (ref 1.2–2.2)
Alkaline Phosphatase: 71 IU/L (ref 39–117)
BUN / CREAT RATIO: 14 (ref 9–23)
BUN: 12 mg/dL (ref 6–24)
Bilirubin Total: 0.4 mg/dL (ref 0.0–1.2)
CO2: 24 mmol/L (ref 18–29)
Calcium: 9.3 mg/dL (ref 8.7–10.2)
Chloride: 102 mmol/L (ref 96–106)
Creatinine, Ser: 0.87 mg/dL (ref 0.57–1.00)
GFR calc Af Amer: 90 mL/min/{1.73_m2} (ref >59)
GFR calc non Af Amer: 78 mL/min/{1.73_m2} (ref >59)
GLOBULIN, TOTAL: 2.5 g/dL (ref 1.5–4.5)
GLUCOSE: 108 mg/dL — AB (ref 65–99)
Potassium: 4.2 mmol/L (ref 3.5–5.2)
SODIUM: 144 mmol/L (ref 134–144)
TOTAL PROTEIN: 6.6 g/dL (ref 6.0–8.5)

## 2015-12-11 LAB — CBC WITH DIFFERENTIAL/PLATELET
BASOS ABS: 0 10*3/uL (ref 0.0–0.2)
Basos: 0 %
EOS (ABSOLUTE): 0.1 10*3/uL (ref 0.0–0.4)
Eos: 2 %
HEMATOCRIT: 37.6 % (ref 34.0–46.6)
Hemoglobin: 13 g/dL (ref 11.1–15.9)
IMMATURE GRANS (ABS): 0 10*3/uL (ref 0.0–0.1)
Immature Granulocytes: 0 %
LYMPHS: 19 %
Lymphocytes Absolute: 1.4 10*3/uL (ref 0.7–3.1)
MCH: 29.3 pg (ref 26.6–33.0)
MCHC: 34.6 g/dL (ref 31.5–35.7)
MCV: 85 fL (ref 79–97)
MONOCYTES: 6 %
Monocytes Absolute: 0.4 10*3/uL (ref 0.1–0.9)
NEUTROS ABS: 5.4 10*3/uL (ref 1.4–7.0)
Neutrophils: 73 %
Platelets: 301 10*3/uL (ref 150–379)
RBC: 4.44 x10E6/uL (ref 3.77–5.28)
RDW: 13.4 % (ref 12.3–15.4)
WBC: 7.4 10*3/uL (ref 3.4–10.8)

## 2015-12-11 LAB — VITAMIN D 25 HYDROXY (VIT D DEFICIENCY, FRACTURES): Vit D, 25-Hydroxy: 31.9 ng/mL (ref 30.0–100.0)

## 2015-12-15 NOTE — Progress Notes (Signed)
#  1 CBC with differential is normal#2 CMP with GFR is normal  #3 vitamin D is low normal. Please advise patient to take vitamin D3 5000-international units on Saturday and Sunday every week. Send copy of labs to PCP and notify patient please

## 2016-01-17 ENCOUNTER — Other Ambulatory Visit: Payer: Self-pay | Admitting: *Deleted

## 2016-01-17 MED ORDER — METHOCARBAMOL 500 MG PO TABS: 500.0000 mg | ORAL_TABLET | Freq: Two times a day (BID) | ORAL | 1 refills | Status: AC | PRN

## 2016-01-18 ENCOUNTER — Other Ambulatory Visit: Payer: Self-pay | Admitting: *Deleted

## 2016-01-19 ENCOUNTER — Telehealth: Payer: Self-pay | Admitting: Rheumatology

## 2016-01-19 NOTE — Telephone Encounter (Signed)
Returned call to [facility] and provided the prescription verification and to provided them the supervising doctor, Dr. Corliss Skainseveshwar. They will send out prescription.

## 2016-01-19 NOTE — Telephone Encounter (Signed)
[facility] called about methocarbomal rx. Please call them, they have questions before filling rx. Cb# [phone]. Reference #: [id number]

## 2016-01-25 ENCOUNTER — Other Ambulatory Visit: Payer: Self-pay | Admitting: Family Medicine

## 2016-01-25 DIAGNOSIS — Z1231 Encounter for screening mammogram for malignant neoplasm of breast: Secondary | ICD-10-CM

## 2016-02-22 ENCOUNTER — Ambulatory Visit
Admission: RE | Admit: 2016-02-22 | Discharge: 2016-02-22 | Disposition: A | Discharge status: Home / Self Care | Payer: 59 | Source: Ambulatory Visit | Attending: Family Medicine | Admitting: Family Medicine

## 2016-02-22 DIAGNOSIS — Z1231 Encounter for screening mammogram for malignant neoplasm of breast: Secondary | ICD-10-CM

## 2016-03-30 ENCOUNTER — Encounter: Payer: Self-pay | Admitting: Rheumatology

## 2016-03-30 ENCOUNTER — Ambulatory Visit (INDEPENDENT_AMBULATORY_CARE_PROVIDER_SITE_OTHER): Payer: 59 | Admitting: Rheumatology

## 2016-03-30 VITALS — BP 146/90 | HR 98 | Resp 15 | Ht 66.0 in | Wt 253.0 lb

## 2016-03-30 DIAGNOSIS — M7062 Trochanteric bursitis, left hip: Secondary | ICD-10-CM | POA: Diagnosis not present

## 2016-03-30 DIAGNOSIS — R5383 Other fatigue: Secondary | ICD-10-CM

## 2016-03-30 DIAGNOSIS — G4700 Insomnia, unspecified: Secondary | ICD-10-CM | POA: Diagnosis not present

## 2016-03-30 DIAGNOSIS — M797 Fibromyalgia: Secondary | ICD-10-CM | POA: Diagnosis not present

## 2016-03-30 MED ORDER — LIDOCAINE HCL 1 % IJ SOLN
1.5000 mL | INTRAMUSCULAR | Status: AC | PRN
  Administered 2016-03-30: 1.5 mL

## 2016-03-30 MED ORDER — TRIAMCINOLONE ACETONIDE 40 MG/ML IJ SUSP
40.0000 mg | INTRAMUSCULAR | Status: AC | PRN
  Administered 2016-03-30: 40 mg via INTRA_ARTICULAR

## 2016-03-30 NOTE — Patient Instructions (Signed)
    Iliotibial Band Syndrome Iliotibial band syndrome (ITBS) is a condition that often causes knee pain. It can also cause pain in the outside of your hip, thigh, and knee. The iliotibial band is a strip of tissue that runs from the outside of your hip and down your thigh to the outside of your knee. Repeatedly bending and straightening your knee can irritate the iliotibial band. What are the causes? This condition is caused by inflammation and irritation from the friction of the iliotibial band moving over the thigh bone (femur) when you repeatedly bend and straighten your knee. What increases the risk? This condition is more likely to develop in people who:  Frequently change elevation during their workouts.  Run very long distances.  Recently increased the length or intensity of their workouts.  Run downhill often, or just started running downhill.  Ride a bike very far or often. You may also be at greater risk if you start a new workout routine without first warming up or if you have a job that requires you to bend, squat, or climb frequently. What are the signs or symptoms? Symptoms of this condition include:  Pain along the outside of your knee that may be worse with activity, especially running or going up and down stairs.  A "snapping" sensation over your knee.  Swelling on the outside of your knee.  Pain or a feeling of tightness in your hip. How is this diagnosed? This condition is diagnosed based on your symptoms, medical history, and physical exam. You may also see a health care provider who specializes in reducing pain and increasing mobility (physical therapist). A physical therapist may do an exam to check your balance, movement, and way of walking or running (gait) to see whether the way you move could contribute to your injury. You may also have tests to measure your strength, flexibility, and range of motion. How is this treated? Treatment for this condition  includes:  Resting and limiting exercise.  Returning to activities gradually.  Doing range-of-motion and strengthening exercises (physical therapy) as told by your health care provider.  Including low-impact activities, such as swimming, in your exercise routine. Follow these instructions at home:  If directed, apply ice to the injured area.  Put ice in a plastic bag.  Place a towel between your skin and the bag.  Leave the ice on for 20 minutes, 2-3 times per day.  Return to your normal activities as told by your health care provider. Ask your health care provider what activities are safe for you.  Keep all follow-up visits with your health care provider. This is important. Contact a health care provider if:  Your pain does not improve or gets worse despite treatment. This information is not intended to replace advice given to you by your health care provider. Make sure you discuss any questions you have with your health care provider. Document Released: 07/08/2001 Document Revised: 01/10/2016 Document Reviewed: 06/18/2015 Elsevier Interactive Patient Education  2017 [employer]  

## 2016-03-30 NOTE — Progress Notes (Signed)
Office Visit Note  Patient: Charlotte DuosChristine M Gren             Date of Birth: 03/24/1965           MRN: [id number]             PCP: Gweneth DimitriMCNEILL,WENDY, MD Referring: Gweneth DimitriMcNeill, Wendy, MD Visit Date: 03/30/2016 Occupation: @GUAROCC @    Subjective:  Pain of the Left Hip Onset 7-8 days ago  History of Present Illness: Charlotte Fox is a 51 y.o. female  Complaining of left greater trochanter bursa pain for the last 7-8 days. Has tried Voltaren gel but has been ineffective.  Fibromyalgia is flaring also. All of her tender points are aggravated.  Patient gives a history of suffering from a migraine for about a week.   Activities of Daily Living:  Patient reports morning stiffness for 30 minutes.   Patient Reports nocturnal pain.  Difficulty dressing/grooming: Denies Difficulty climbing stairs: Reports Difficulty getting out of chair: Reports Difficulty using hands for taps, buttons, cutlery, and/or writing: Denies   Review of Systems  Constitutional: Positive for fatigue.  HENT: Negative for mouth sores and mouth dryness.   Eyes: Negative for dryness.  Respiratory: Negative for shortness of breath.   Gastrointestinal: Negative for constipation and diarrhea.  Musculoskeletal: Positive for myalgias and myalgias.  Skin: Negative for sensitivity to sunlight.  Psychiatric/Behavioral: Positive for sleep disturbance. Negative for decreased concentration.    PMFS History:  Patient Active Problem List   Diagnosis Date Noted  . Fatigue 12/10/2015  . Fibromyalgia 12/09/2015  . Osteoarthritis of both hands 12/09/2015  . Osteoarthritis of right hip 12/09/2015  . Hypertension 12/09/2015  . Elevated cholesterol 12/09/2015  . Migraines 12/09/2015  . Insomnia 12/09/2015  . History of endometriosis 12/09/2015  . Ventral hernia without obstruction or gangrene 09/01/2014  . Mood disorder (HCC) 10/21/2013  . GERD (gastroesophageal reflux disease) 10/21/2013  . Nausea with vomiting  10/21/2013  . Hypokalemia 10/21/2013  . Cellulitis of right leg 10/20/2013  . Cellulitis of right foot 10/20/2013    Past Medical History:  Diagnosis Date  . Acid reflux   . Anemia   . Arthritis   . Asthma   . Complication of anesthesia    propofol dropped resp during colonoscopy, woke up during procedure  . Depression   . Elevated cholesterol 12/09/2015  . Fibromyalgia   . Headache    hx migraines  . Heart murmur    does not give her any problems  . High cholesterol   . History of endometriosis 12/09/2015  . History of hiatal hernia   . Hypertension   . Insomnia 12/09/2015  . Migraines 12/09/2015  . Osteoarthritis of both hands 12/09/2015  . Osteoarthritis of right hip 12/09/2015   Mild  . Patent foramen ovale    no longer sees cardiology  . Retinal artery occlusion   . Stroke [facility](HCC)    retinal artery   several yrs ago,  left eye diminished vision     Family History  Problem Relation Age of Onset  . Hypertension Mother   . Diabetes Mother   . Arthritis Mother    Past Surgical History:  Procedure Laterality Date  . ABDOMINAL HYSTERECTOMY     appendectomy/ colon  . ANKLE SURGERY     at age 51  . APPENDECTOMY    . CHOLECYSTECTOMY N/A 05/21/2014   Procedure: LAPAROSCOPIC CHOLECYSTECTOMY WITH INTRAOPERATIVE CHOLANGIOGRAM;  Surgeon: Manus RuddMatthew Tsuei, MD;  Location: [facility] OR;  Service: General;  Laterality:  N/A;  . COLON SURGERY     removed 5 in colon during hysterectmy [place] due to mass  . COLONOSCOPY     x 2  . KNEE SURGERY    . TOE SURGERY    . VENTRAL HERNIA REPAIR  09/01/2014   with mesh  . VENTRAL HERNIA REPAIR N/A 09/01/2014   Procedure: LAPAROSCOPIC VENTRAL HERNIA REPAIR WITH MESH;  Surgeon: Manus Rudd, MD;  Location: [facility] OR;  Service: General;  Laterality: N/A;   Social History   Social History Narrative  . No narrative on file     Objective: Vital Signs: BP (!) 146/90   Pulse 98   Resp 15   Ht 5\' 6"  (1.676 m)   Wt 253 lb (114.8 kg)   LMP  12/31/2011   BMI 40.84 kg/m    Physical Exam  Constitutional: She is oriented to person, place, and time. She appears well-developed and well-nourished.  HENT:  Head: Normocephalic and atraumatic.  Eyes: EOM are normal. Pupils are equal, round, and reactive to light.  Cardiovascular: Normal rate, regular rhythm and normal heart sounds.  Exam reveals no gallop and no friction rub.   No murmur heard. Pulmonary/Chest: Effort normal and breath sounds normal. She has no wheezes. She has no rales.  Abdominal: Soft. Bowel sounds are normal. She exhibits no distension. There is no tenderness. There is no guarding. No hernia.  Musculoskeletal: Normal range of motion. She exhibits no edema, tenderness or deformity.  Lymphadenopathy:    She has no cervical adenopathy.  Neurological: She is alert and oriented to person, place, and time. Coordination normal.  Skin: Skin is warm and dry. Capillary refill takes less than 2 seconds. No rash noted.  Psychiatric: She has a normal mood and affect. Her behavior is normal.  Nursing note and vitals reviewed.    Musculoskeletal Exam:  Full range of motion of all joints Grip strength is equal and strong bilaterally Fiber myalgia tender points are 18 out of 18 positive  CDAI Exam: CDAI Homunculus Exam:   Joint Counts:  CDAI Tender Joint count: 0 CDAI Swollen Joint count: 0     Investigation: No additional findings.   Imaging: No results found.  Speciality Comments: No specialty comments available.    Procedures:  Large Joint Inj Date/Time: 03/30/2016 10:11 AM Performed by: Tawni Pummel Authorized by: Tawni Pummel   Consent Given by:  Patient Site marked: the procedure site was marked   Timeout: prior to procedure the correct patient, procedure, and site was verified   Indications:  Pain Location:  Hip Site:  L greater trochanter Prep: patient was prepped and draped in usual sterile fashion   Needle Size:  27 G Approach:   Superior Ultrasound Guidance: No   Fluoroscopic Guidance: No   Arthrogram: No   Medications:  1.5 mL lidocaine 1 %; 40 mg triamcinolone acetonide 40 MG/ML Aspiration Attempted: Yes   Aspirate amount (mL):  0 Patient tolerance:  Patient tolerated the procedure well with no immediate complications  Left greater trochanter bursitis. 40 mg of Kenalog mixed with one half mL's 1% lidocaine. No complication. Patient tolerated procedure well.   Allergies: Erythromycin; Propofol; Sulfa antibiotics; Levaquin [levofloxacin]; Allegra-d [fexofenadine-pseudoephed er]; Azithromycin; Penicillins; and Vicodin [hydrocodone-acetaminophen]   Assessment / Plan:     Visit Diagnoses: Fibromyalgia  Fatigue, unspecified type  Insomnia, unspecified type  Greater trochanteric bursitis of left hip - Plan: Large Joint Injection/Arthrocentesis   Plan: 1  See procedure above for left greater trochanter bursa  injection Patient's pain was 8 before the injection. After the cortisone injection, she rates her pain is 1 on a scale of 0-10 2  Patient has follow-up appointment in May 2018 for fibromyalgia. #3: IT band exercises; I showed her how to do the exercises in the office. If she cannot do those exercises, I've given her iliotibial band handout. We discussed weight loss; patient can use Voltaren gel temporarily for flares. #4: No med refills needed at this time   Orders: Orders Placed This Encounter  Procedures  . Large Joint Injection/Arthrocentesis   No orders of the defined types were placed in this encounter.   Face-to-face time spent with patient was 30 minutes. 50% of time was spent in counseling and coordination of care.  Follow-Up Instructions: Return keep may appt, for FMS, FATIGUE,INSOMNIA.   Tawni Pummel, PA-C  Note - This record has been created using [employer].  Chart creation errors have been sought, but may not always  have been located. Such creation errors do not reflect  on  the standard of medical care.

## 2016-06-05 NOTE — Progress Notes (Signed)
Office Visit Note  Patient: Charlotte Fox             Date of Birth: December 20, 1965           MRN: [id number]             PCP: Cari Caraway, MD Referring: Cari Caraway, MD Visit Date: 06/06/2016 Occupation: _0 @    Subjective:  Follow-up and Fatigue   History of Present Illness: Charlotte Fox is a 51 y.o. female  Last  12/10/2015 & 03/30/2016 (left greater trochanter injection).  A lot of fatigue. Using flexeril prn (not everyday). 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  Robaxin helps (uses prn)  Wants refill on flexeril 18m (works well for patient).   Injection given at the last visit in March 2018 helped tremendously. Patient is still pain free in the left greater trochanter area.  Activities of Daily Living:  Patient reports morning stiffness for 15 minutes.   Patient Denies nocturnal pain.  Difficulty dressing/grooming: Denies Difficulty climbing stairs: Denies Difficulty getting out of chair: Denies Difficulty using hands for taps, buttons, cutlery, and/or writing: Denies   Review of Systems  Constitutional: Positive for fatigue.  HENT: Negative for mouth sores and mouth dryness.   Eyes: Negative for dryness.  Respiratory: Negative for shortness of breath.   Gastrointestinal: Negative for constipation and diarrhea.  Musculoskeletal: Positive for myalgias and myalgias.  Skin: Negative for sensitivity to sunlight.  Psychiatric/Behavioral: Positive for sleep disturbance. Negative for decreased concentration.    PMFS History:  Patient Active Problem List   Diagnosis Date Noted  . Fatigue 12/10/2015  . Fibromyalgia 12/09/2015  . Osteoarthritis of both hands 12/09/2015  . Osteoarthritis of right hip 12/09/2015  . Hypertension 12/09/2015  . Elevated cholesterol 12/09/2015  . Migraines 12/09/2015  . Insomnia 12/09/2015  . History of endometriosis 12/09/2015  . Ventral hernia without obstruction or gangrene  09/01/2014  . Mood disorder ([facility] 10/21/2013  . GERD (gastroesophageal reflux disease) 10/21/2013  . Nausea with vomiting 10/21/2013  . Hypokalemia 10/21/2013  . Cellulitis of right leg 10/20/2013  . Cellulitis of right foot 10/20/2013    Past Medical History:  Diagnosis Date  . Acid reflux   . Anemia   . Arthritis   . Asthma   . Complication of anesthesia    propofol dropped resp during colonoscopy, woke up during procedure  . Depression   . Elevated cholesterol 12/09/2015  . Fibromyalgia   . Headache    hx migraines  . Heart murmur    does not give her any problems  . High cholesterol   . History of endometriosis 12/09/2015  . History of hiatal hernia   . Hypertension   . Insomnia 12/09/2015  . Migraines 12/09/2015  . Osteoarthritis of both hands 12/09/2015  . Osteoarthritis of right hip 12/09/2015   Mild  . Patent foramen ovale    no longer sees cardiology  . Retinal artery occlusion   . Stroke ([facility]    retinal artery   several yrs ago,  left eye diminished vision     Family History  Problem Relation Age of Onset  . Hypertension Mother   . Diabetes Mother   . Arthritis Mother    Past Surgical History:  Procedure Laterality Date  . ABDOMINAL HYSTERECTOMY     appendectomy/ colon  . ANKLE SURGERY     at age 51 . APPENDECTOMY    . CHOLECYSTECTOMY N/A 05/21/2014  Procedure: LAPAROSCOPIC CHOLECYSTECTOMY WITH INTRAOPERATIVE CHOLANGIOGRAM;  Surgeon: Donnie Mesa, MD;  Location: [place];  Service: General;  Laterality: N/A;  . COLON SURGERY     removed 5 in colon during hysterectmy [place] due to mass  . COLONOSCOPY     x 2  . KNEE SURGERY    . TOE SURGERY    . VENTRAL HERNIA REPAIR  09/01/2014   with mesh  . VENTRAL HERNIA REPAIR N/A 09/01/2014   Procedure: LAPAROSCOPIC VENTRAL HERNIA REPAIR WITH MESH;  Surgeon: Donnie Mesa, MD;  Location: [place];  Service: General;  Laterality: N/A;   Social History   Social History Narrative  . No narrative on file      Objective: Vital Signs: BP 124/86   Pulse 80   Resp 18   Ht 5' 6.5" (1.689 m)   Wt 248 lb (112.5 kg)   LMP 12/31/2011   BMI 39.43 kg/m    Physical Exam  Constitutional: She is oriented to person, place, and time. She appears well-developed and well-nourished.  HENT:  Head: Normocephalic and atraumatic.  Eyes: EOM are normal. Pupils are equal, round, and reactive to light.  Cardiovascular: Normal rate, regular rhythm and normal heart sounds.  Exam reveals no gallop and no friction rub.   No murmur heard. Pulmonary/Chest: Effort normal and breath sounds normal. She has no wheezes. She has no rales.  Abdominal: Soft. Bowel sounds are normal. She exhibits no distension. There is no tenderness. There is no guarding. No hernia.  Musculoskeletal: Normal range of motion. She exhibits no edema, tenderness or deformity.  Lymphadenopathy:    She has no cervical adenopathy.  Neurological: She is alert and oriented to person, place, and time. Coordination normal.  Skin: Skin is warm and dry. Capillary refill takes less than 2 seconds. No rash noted.  Psychiatric: She has a normal mood and affect. Her behavior is normal.  Nursing note and vitals reviewed.    Musculoskeletal Exam:  Full range of motion of all joints Grip strength is equal and strong bilaterally Fibromyalgia tender points are 18 out of 18 positive  CDAI Exam: CDAI Homunculus Exam:   Joint Counts:  CDAI Tender Joint count: 0 CDAI Swollen Joint count: 0  Global Assessments:  Patient Global Assessment: 4 Provider Global Assessment: 4  CDAI Calculated Score: 8  No synovitis on examination  Investigation: No additional findings.  Labs are passed to, I sent patient to [facility] (patient's preference/request) so she can get labs done today. (for free).  No visits with results within 3 Month(s) from this visit.  Latest known visit with results is:  Orders Only on 12/10/2015  Component Date Value Ref Range Status   . WBC 12/10/2015 7.4  3.4 - 10.8 x10E3/uL Final  . RBC 12/10/2015 4.44  3.77 - 5.28 x10E6/uL Final  . Hemoglobin 12/10/2015 13.0  11.1 - 15.9 g/dL Final  . Hematocrit 12/10/2015 37.6  34.0 - 46.6 % Final  . MCV 12/10/2015 85  79 - 97 fL Final  . MCH 12/10/2015 29.3  26.6 - 33.0 pg Final  . MCHC 12/10/2015 34.6  31.5 - 35.7 g/dL Final  . RDW 12/10/2015 13.4  12.3 - 15.4 % Final  . Platelets 12/10/2015 301  150 - 379 x10E3/uL Final  . Neutrophils 12/10/2015 73  Not Estab. % Final  . Lymphs 12/10/2015 19  Not Estab. % Final  . Monocytes 12/10/2015 6  Not Estab. % Final  . Eos 12/10/2015 2  Not Estab. % Final  .  Basos 12/10/2015 0  Not Estab. % Final  . Neutrophils Absolute 12/10/2015 5.4  1.4 - 7.0 x10E3/uL Final  . Lymphocytes Absolute 12/10/2015 1.4  0.7 - 3.1 x10E3/uL Final  . Monocytes Absolute 12/10/2015 0.4  0.1 - 0.9 x10E3/uL Final  . EOS (ABSOLUTE) 12/10/2015 0.1  0.0 - 0.4 x10E3/uL Final  . Basophils Absolute 12/10/2015 0.0  0.0 - 0.2 x10E3/uL Final  . Immature Granulocytes 12/10/2015 0  Not Estab. % Final  . Immature Grans (Abs) 12/10/2015 0.0  0.0 - 0.1 x10E3/uL Final  . Glucose 12/10/2015 108* 65 - 99 mg/dL Final  . BUN 12/10/2015 12  6 - 24 mg/dL Final  . Creatinine, Ser 12/10/2015 0.87  0.57 - 1.00 mg/dL Final  . GFR calc non Af Amer 12/10/2015 78  >59 mL/min/1.73 Final  . GFR calc Af Amer 12/10/2015 90  >59 mL/min/1.73 Final  . BUN/Creatinine Ratio 12/10/2015 14  9 - 23 Final  . Sodium 12/10/2015 144  134 - 144 mmol/L Final  . Potassium 12/10/2015 4.2  3.5 - 5.2 mmol/L Final  . Chloride 12/10/2015 102  96 - 106 mmol/L Final  . CO2 12/10/2015 24  18 - 29 mmol/L Final  . Calcium 12/10/2015 9.3  8.7 - 10.2 mg/dL Final  . Total Protein 12/10/2015 6.6  6.0 - 8.5 g/dL Final  . Albumin 12/10/2015 4.1  3.5 - 5.5 g/dL Final  . Globulin, Total 12/10/2015 2.5  1.5 - 4.5 g/dL Final  . Albumin/Globulin Ratio 12/10/2015 1.6  1.2 - 2.2 Final  . Bilirubin Total 12/10/2015 0.4  0.0 -  1.2 mg/dL Final  . Alkaline Phosphatase 12/10/2015 71  39 - 117 IU/L Final  . AST 12/10/2015 20  0 - 40 IU/L Final  . ALT 12/10/2015 20  0 - 32 IU/L Final  . Vit D, 25-Hydroxy 12/10/2015 31.9  30.0 - 100.0 ng/mL Final   Comment: Vitamin D deficiency has been defined by the Institute of Medicine and an Endocrine Society practice guideline as a level of serum 25-OH vitamin D less than 20 ng/mL (1,2). The Endocrine Society went on to further define vitamin D insufficiency as a level between 21 and 29 ng/mL (2). 1. IOM (Institute of Medicine). 2010. Dietary reference    intakes for calcium and D. [place]: The    [employer]. 2. Holick MF, Binkley [place], Bischoff-Ferrari HA, et al.    Evaluation, treatment, and prevention of vitamin D    deficiency: an Endocrine Society clinical practice    guideline. JCEM. 2011 Jul; 96(7):1911-30.     Imaging: No results found.  Speciality Comments: No specialty comments available.    Procedures:  No procedures performed Allergies: Erythromycin; Propofol; Sulfa antibiotics; Levaquin [levofloxacin]; Allegra-d [fexofenadine-pseudoephed er]; Azithromycin; Penicillins; and Vicodin [hydrocodone-acetaminophen]   Assessment / Plan:     Visit Diagnoses: Primary osteoarthritis of both hands  Primary osteoarthritis of right hip  Insomnia, unspecified type  Fatigue, unspecified type - Plan: CBC with Differential/Platelet, CMP14+EGFR  Fibromyalgia  Gastroesophageal reflux disease, esophagitis presence not specified  History of migraine   Plan: #1: Fibromyalgia. Active disease which otherwise pain and 18 out of 18 tender points. #2: Insomnia. Patient does well with Flexeril 5 mg daily at bedtime. Is requesting refill #3: Fatigue. Ongoing. History of low vitamin D. Check by Dr. Leonides Schanz. Recently, patient was taking insufficient amounts of vitamin D. She is now taking 2000 IUs daily with good results. Most recent lab work done at Dr.  Guido Sander office showed that her  vitamin D levels were back in normal range. #4: Patient is changing jobs. 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 #5: Return to clinic in 6 months   Orders: Orders Placed This Encounter  Procedures  . CBC with Differential/Platelet  . CMP14+EGFR   Meds ordered this encounter  Medications  . cyclobenzaprine (FLEXERIL) 5 MG tablet    Sig: Take 1 tablet (5 mg total) by mouth at bedtime.    Dispense:  90 tablet    Refill:  1    Order Specific Question:   Supervising Provider    Answer:   Bo Merino [phone]    Face-to-face time spent with patient was 30 minutes. 50% of time was spent in counseling and coordination of care.  Follow-Up Instructions: Return in about 6 months (around 12/07/2016).   Eliezer Lofts, PA-C  Note - This record has been created using [employer].  Chart creation errors have been sought, but may not always  have been located. Such creation errors do not reflect on  the standard of medical care.

## 2016-06-06 ENCOUNTER — Ambulatory Visit (INDEPENDENT_AMBULATORY_CARE_PROVIDER_SITE_OTHER): Payer: 59 | Admitting: Rheumatology

## 2016-06-06 ENCOUNTER — Encounter: Payer: Self-pay | Admitting: Rheumatology

## 2016-06-06 VITALS — BP 124/86 | HR 80 | Resp 18 | Ht 66.5 in | Wt 248.0 lb

## 2016-06-06 DIAGNOSIS — M19041 Primary osteoarthritis, right hand: Secondary | ICD-10-CM

## 2016-06-06 DIAGNOSIS — G4700 Insomnia, unspecified: Secondary | ICD-10-CM

## 2016-06-06 DIAGNOSIS — Z8669 Personal history of other diseases of the nervous system and sense organs: Secondary | ICD-10-CM

## 2016-06-06 DIAGNOSIS — M19042 Primary osteoarthritis, left hand: Secondary | ICD-10-CM

## 2016-06-06 DIAGNOSIS — K219 Gastro-esophageal reflux disease without esophagitis: Secondary | ICD-10-CM | POA: Diagnosis not present

## 2016-06-06 DIAGNOSIS — M797 Fibromyalgia: Secondary | ICD-10-CM | POA: Diagnosis not present

## 2016-06-06 DIAGNOSIS — R5383 Other fatigue: Secondary | ICD-10-CM

## 2016-06-06 DIAGNOSIS — M1611 Unilateral primary osteoarthritis, right hip: Secondary | ICD-10-CM

## 2016-06-06 MED ORDER — CYCLOBENZAPRINE HCL 5 MG PO TABS: 5.0000 mg | ORAL_TABLET | Freq: Every day | ORAL | 1 refills | Status: AC

## 2016-06-10 LAB — CBC WITH DIFFERENTIAL/PLATELET
Basophils Absolute: 0 10*3/uL (ref 0.0–0.2)
Basos: 0 %
EOS (ABSOLUTE): 0.1 10*3/uL (ref 0.0–0.4)
EOS: 1 %
HEMATOCRIT: 39.9 % (ref 34.0–46.6)
HEMOGLOBIN: 12.9 g/dL (ref 11.1–15.9)
IMMATURE GRANS (ABS): 0 10*3/uL (ref 0.0–0.1)
Immature Granulocytes: 0 %
LYMPHS: 21 %
Lymphocytes Absolute: 1.7 10*3/uL (ref 0.7–3.1)
MCH: 27.9 pg (ref 26.6–33.0)
MCHC: 32.3 g/dL (ref 31.5–35.7)
MCV: 86 fL (ref 79–97)
Monocytes Absolute: 0.5 10*3/uL (ref 0.1–0.9)
Monocytes: 6 %
Neutrophils Absolute: 6 10*3/uL (ref 1.4–7.0)
Neutrophils: 72 %
Platelets: 320 10*3/uL (ref 150–379)
RBC: 4.62 x10E6/uL (ref 3.77–5.28)
RDW: 14 % (ref 12.3–15.4)
WBC: 8.4 10*3/uL (ref 3.4–10.8)

## 2016-06-10 LAB — CMP14+EGFR
ALT: 14 IU/L (ref 0–32)
AST: 16 IU/L (ref 0–40)
Albumin/Globulin Ratio: 1.7 (ref 1.2–2.2)
Albumin: 4.3 g/dL (ref 3.5–5.5)
Alkaline Phosphatase: 75 IU/L (ref 39–117)
BUN/Creatinine Ratio: 16 (ref 9–23)
BUN: 14 mg/dL (ref 6–24)
Bilirubin Total: 0.4 mg/dL (ref 0.0–1.2)
CALCIUM: 9.3 mg/dL (ref 8.7–10.2)
CO2: 25 mmol/L (ref 18–29)
Chloride: 101 mmol/L (ref 96–106)
Creatinine, Ser: 0.88 mg/dL (ref 0.57–1.00)
GFR calc non Af Amer: 77 mL/min/{1.73_m2} (ref >59)
GFR, EST AFRICAN AMERICAN: 89 mL/min/{1.73_m2} (ref >59)
GLUCOSE: 88 mg/dL (ref 65–99)
Globulin, Total: 2.6 g/dL (ref 1.5–4.5)
Potassium: 4.4 mmol/L (ref 3.5–5.2)
Sodium: 141 mmol/L (ref 134–144)
TOTAL PROTEIN: 6.9 g/dL (ref 6.0–8.5)

## 2016-06-12 NOTE — Progress Notes (Signed)
WNL

## 2016-12-03 NOTE — Progress Notes (Signed)
Office Visit Note  Patient: Charlotte Fox             Date of Birth: [birth year]           MRN: [id number]             PCP: Gweneth Dimitri, MD Referring: Gweneth Dimitri, MD Visit Date: 12/13/2016 Occupation: @GUAROCC @    Subjective:  "Clicking in right knee".   History of Present Illness: Charlotte Fox is a 51 y.o. female with history of fibromyalgia and osteoarthritis. She states she's been having some clicking sensation in her right knee joint while she is climbing stairs. She denies any joint swelling. She's been doing some stretching exercises were trochanteric bursitis which is doing better. She has off-and-on discomfort in her hands for which she uses Motrin gel on when necessary basis. She's also taking Flexeril on when necessary basis for myalgias. Her insomnia is better.  Activities of Daily Living:  Patient reports morning stiffness for 2 minute.   Patient Denies nocturnal pain.  Difficulty dressing/grooming: Denies Difficulty climbing stairs: Denies Difficulty getting out of chair: Denies Difficulty using hands for taps, buttons, cutlery, and/or writing: Denies   Review of Systems  Constitutional: Positive for fatigue. Negative for night sweats, weight gain, weight loss and weakness.  HENT: Negative for mouth sores, trouble swallowing, trouble swallowing, mouth dryness and nose dryness.   Eyes: Positive for dryness. Negative for pain, redness and visual disturbance.  Respiratory: Negative for cough, shortness of breath and difficulty breathing.   Cardiovascular: Negative for chest pain, palpitations, hypertension, irregular heartbeat and swelling in legs/feet.  Gastrointestinal: Negative for blood in stool, constipation and diarrhea.  Endocrine: Negative for increased urination.  Genitourinary: Negative for vaginal dryness.  Musculoskeletal: Positive for arthralgias, joint pain and morning stiffness. Negative for joint swelling, myalgias, muscle weakness,  muscle tenderness and myalgias.  Skin: Negative for color change, rash, hair loss, skin tightness, ulcers and sensitivity to sunlight.  Allergic/Immunologic: Negative for susceptible to infections.  Neurological: Negative for dizziness, memory loss and night sweats.  Hematological: Negative for swollen glands.  Psychiatric/Behavioral: Positive for depressed mood. Negative for sleep disturbance. The patient is not nervous/anxious.     PMFS History:  Patient Active Problem List   Diagnosis Date Noted  . Class 3 severe obesity due to excess calories without serious comorbidity with body mass index (BMI) of 40.0 to 44.9 in adult (HCC) 12/13/2016  . History of bowel resection 12/13/2016  . History of hypertension 12/13/2016  . History of depression 12/13/2016  . History of ankle surgery 12/12/2016  . Fatigue 12/10/2015  . Fibromyalgia 12/09/2015  . Osteoarthritis of both hands 12/09/2015  . Osteoarthritis of right hip 12/09/2015  . Hypertension 12/09/2015  . Elevated cholesterol 12/09/2015  . Migraines 12/09/2015  . Insomnia 12/09/2015  . History of endometriosis 12/09/2015  . Ventral hernia without obstruction or gangrene 09/01/2014  . Mood disorder (HCC) 10/21/2013  . GERD (gastroesophageal reflux disease) 10/21/2013  . Nausea with vomiting 10/21/2013  . Hypokalemia 10/21/2013  . Cellulitis of right leg 10/20/2013  . Cellulitis of right foot 10/20/2013    Past Medical History:  Diagnosis Date  . Acid reflux   . Anemia   . Arthritis   . Asthma   . Complication of anesthesia    propofol dropped resp during colonoscopy, woke up during procedure  . Depression   . Elevated cholesterol 12/09/2015  . Fibromyalgia   . Headache    hx migraines  . Heart murmur  does not give her any problems  . High cholesterol   . History of endometriosis 12/09/2015  . History of hiatal hernia   . Hypertension   . Insomnia 12/09/2015  . Migraines 12/09/2015  . Osteoarthritis of both hands  12/09/2015  . Osteoarthritis of right hip 12/09/2015   Mild  . Patent foramen ovale    no longer sees cardiology  . Retinal artery occlusion   . Stroke [facility])    retinal artery   several yrs ago,  left eye diminished vision     Family History  Problem Relation Age of Onset  . Hypertension Mother   . Diabetes Mother   . Arthritis Mother   . Hypertension Brother    Past Surgical History:  Procedure Laterality Date  . ABDOMINAL HYSTERECTOMY     appendectomy/ colon  . ANKLE SURGERY     at age 11  . APPENDECTOMY    . COLON SURGERY     removed 5 in colon during hysterectmy [place] due to mass  . COLONOSCOPY     x 2  . KNEE SURGERY    . TOE SURGERY    . VENTRAL HERNIA REPAIR  09/01/2014   with mesh   Social History   Social History Narrative  . Not on file     Objective: Vital Signs: BP (!) 156/99 (BP Location: Left Arm, Patient Position: Sitting, Cuff Size: Normal)   Pulse 84   Resp 17   Ht 5' 6.5" (1.689 m)   Wt 253 lb (114.8 kg)   LMP 12/31/2011   BMI 40.22 kg/m    Physical Exam  Constitutional: She is oriented to person, place, and time. She appears well-developed and well-nourished.  HENT:  Head: Normocephalic and atraumatic.  Eyes: Conjunctivae and EOM are normal.  Neck: Normal range of motion.  Cardiovascular: Normal rate, regular rhythm, normal heart sounds and intact distal pulses.  Pulmonary/Chest: Effort normal and breath sounds normal.  Abdominal: Soft. Bowel sounds are normal.  Lymphadenopathy:    She has no cervical adenopathy.  Neurological: She is alert and oriented to person, place, and time.  Skin: Skin is warm and dry. Capillary refill takes less than 2 seconds.  Psychiatric: She has a normal mood and affect. Her behavior is normal.  Nursing note and vitals reviewed.    Musculoskeletal Exam: C-spine and thoracic lumbar spine good range of motion. Shoulder joints elbow joints wrist joint MCPs PIPs DIPs with good range of motion. She has  some DIP PIP thickening in her hands consistent with osteoarthritis. She is good range of motion of her hip joints. She is mild tenderness over trochanteric bursa. She has some crepitus in her right knee joint without any warmth swelling or effusion. All other joints are full  range of motion with no synovitis.  CDAI Exam: No CDAI exam completed.    Investigation: No additional findings.   Imaging: Xr Knee 3 View Right  Result Date: 12/13/2016 Moderate medial compartment narrowing without chondrocalcinosis. Severe patellofemoral narrowing consistent with chondromalacia patella. Impression moderate osteoarthritis and severe chondromalacia patella.   Speciality Comments: No specialty comments available.    Procedures:  No procedures performed Allergies: Erythromycin; Propofol; Sulfa antibiotics; Levaquin [levofloxacin]; Allegra-d [fexofenadine-pseudoephed er]; Azithromycin; Penicillins; and Vicodin [hydrocodone-acetaminophen]   Assessment / Plan:     Visit Diagnoses: Fibromyalgia -she continues to have some generalized pain and discomfort persist tolerable. She's been taking Flexeril on when necessary basis. She's been tolerating it well.She is on Flexeril 5 mg  po q hs prn.  Primary osteoarthritis of both hands - uses Voltaren gel on when necessary basis for stiffness.  Primary osteoarthritis of right hip: Doing better  Chronic pain of right knee -she's been having some discomfort and popping sensation in her right knee. Plan: XR KNEE 3 VIEW RIGHT.x-ray revealed moderate osteoarthritis and severe chondromalacia patella. A handout on knee joint and muscle strengthening exercises was given. Weight loss diet and exercise was discussed.  Class III obesity: Weight loss diet and exercise was discussed.  History of insomnia: Is better  History of depression - On Prozac 20 mg po qhs. According to her that her depression is controlled.  History of hypertension: Her blood pressure is  elevated today. Advised her to monitor blood pressure closely and follow up with her PCP.  Other medical problems are listed as follows:  History of migraine  History of hypercholesterolemia  History of gastroesophageal reflux (GERD)  History of bowel resection    Orders: Orders Placed This Encounter  Procedures  . XR KNEE 3 VIEW RIGHT   No orders of the defined types were placed in this encounter.   .  Follow-Up Instructions: Return in about 6 months (around 06/12/2017) for FMS OA.   Pollyann SavoyShaili Metzli Pollick, MD  Note - This record has been created using Animal nutritionistDragon software.  Chart creation errors have been sought, but may not always  have been located. Such creation errors do not reflect on  the standard of medical care.

## 2016-12-07 ENCOUNTER — Ambulatory Visit: Payer: 59 | Admitting: Rheumatology

## 2016-12-12 DIAGNOSIS — Z9889 Other specified postprocedural states: Secondary | ICD-10-CM | POA: Insufficient documentation

## 2016-12-13 ENCOUNTER — Encounter: Payer: Self-pay | Admitting: Rheumatology

## 2016-12-13 ENCOUNTER — Ambulatory Visit: Payer: 59 | Admitting: Rheumatology

## 2016-12-13 ENCOUNTER — Ambulatory Visit (INDEPENDENT_AMBULATORY_CARE_PROVIDER_SITE_OTHER): Payer: 59

## 2016-12-13 VITALS — BP 156/99 | HR 84 | Resp 17 | Ht 66.5 in | Wt 253.0 lb

## 2016-12-13 DIAGNOSIS — M25561 Pain in right knee: Secondary | ICD-10-CM

## 2016-12-13 DIAGNOSIS — Z8679 Personal history of other diseases of the circulatory system: Secondary | ICD-10-CM | POA: Insufficient documentation

## 2016-12-13 DIAGNOSIS — Z8719 Personal history of other diseases of the digestive system: Secondary | ICD-10-CM

## 2016-12-13 DIAGNOSIS — G8929 Other chronic pain: Secondary | ICD-10-CM

## 2016-12-13 DIAGNOSIS — Z8669 Personal history of other diseases of the nervous system and sense organs: Secondary | ICD-10-CM | POA: Diagnosis not present

## 2016-12-13 DIAGNOSIS — M1611 Unilateral primary osteoarthritis, right hip: Secondary | ICD-10-CM

## 2016-12-13 DIAGNOSIS — Z8639 Personal history of other endocrine, nutritional and metabolic disease: Secondary | ICD-10-CM | POA: Diagnosis not present

## 2016-12-13 DIAGNOSIS — M19042 Primary osteoarthritis, left hand: Secondary | ICD-10-CM

## 2016-12-13 DIAGNOSIS — Z9889 Other specified postprocedural states: Secondary | ICD-10-CM | POA: Diagnosis not present

## 2016-12-13 DIAGNOSIS — Z87898 Personal history of other specified conditions: Secondary | ICD-10-CM | POA: Diagnosis not present

## 2016-12-13 DIAGNOSIS — Z6841 Body Mass Index (BMI) 40.0 and over, adult: Secondary | ICD-10-CM | POA: Insufficient documentation

## 2016-12-13 DIAGNOSIS — Z9049 Acquired absence of other specified parts of digestive tract: Secondary | ICD-10-CM | POA: Insufficient documentation

## 2016-12-13 DIAGNOSIS — M19041 Primary osteoarthritis, right hand: Secondary | ICD-10-CM | POA: Diagnosis not present

## 2016-12-13 DIAGNOSIS — Z8659 Personal history of other mental and behavioral disorders: Secondary | ICD-10-CM | POA: Insufficient documentation

## 2016-12-13 DIAGNOSIS — M797 Fibromyalgia: Secondary | ICD-10-CM | POA: Diagnosis not present

## 2016-12-13 NOTE — Patient Instructions (Signed)

## 2017-02-05 ENCOUNTER — Other Ambulatory Visit: Payer: Self-pay | Admitting: Family Medicine

## 2017-02-05 DIAGNOSIS — Z1231 Encounter for screening mammogram for malignant neoplasm of breast: Secondary | ICD-10-CM

## 2017-02-27 ENCOUNTER — Ambulatory Visit
Admission: RE | Admit: 2017-02-27 | Discharge: 2017-02-27 | Disposition: A | Discharge status: Home / Self Care | Payer: Managed Care, Other (non HMO) | Source: Ambulatory Visit | Attending: Family Medicine | Admitting: Family Medicine

## 2017-02-27 DIAGNOSIS — Z1231 Encounter for screening mammogram for malignant neoplasm of breast: Secondary | ICD-10-CM

## 2017-03-23 ENCOUNTER — Encounter: Payer: Self-pay | Admitting: Internal Medicine

## 2017-03-23 ENCOUNTER — Ambulatory Visit (INDEPENDENT_AMBULATORY_CARE_PROVIDER_SITE_OTHER): Payer: Managed Care, Other (non HMO) | Admitting: Internal Medicine

## 2017-03-23 DIAGNOSIS — Z23 Encounter for immunization: Secondary | ICD-10-CM | POA: Diagnosis not present

## 2017-03-23 DIAGNOSIS — Z7189 Other specified counseling: Secondary | ICD-10-CM

## 2017-03-23 DIAGNOSIS — Z9189 Other specified personal risk factors, not elsewhere classified: Secondary | ICD-10-CM | POA: Diagnosis not present

## 2017-03-23 DIAGNOSIS — Z7185 Encounter for immunization safety counseling: Secondary | ICD-10-CM

## 2017-03-23 DIAGNOSIS — Z789 Other specified health status: Secondary | ICD-10-CM | POA: Diagnosis not present

## 2017-03-23 DIAGNOSIS — Z7184 Encounter for health counseling related to travel: Secondary | ICD-10-CM

## 2017-03-23 MED ORDER — AZITHROMYCIN 500 MG PO TABS: 1000.0000 mg | ORAL_TABLET | Freq: Once | ORAL | 0 refills | Status: AC

## 2017-03-23 NOTE — Progress Notes (Signed)
Subjective:   Charlotte Fox is a 52 y.o. female who presents to the Infectious Disease clinic for travel consultation. Planned departure date: March 2019          Planned return date: 2 weeks Countries of travel: [place], [place] and [place] Areas in country: urban   Accommodations: cruise ship Purpose of travel: vacation Prior travel out of [place]: no     Objective:   Medications: reviewed    Assessment:   No contraindications to travel. none     Plan:    Issues discussed: environmental concerns, freshwater swimming, future shots, malaria, motion sickness, MVA safety, rabies, safe food/water, traveler's diarrhea, website/handouts for more information, what to do if ill upon return and what to do if ill while there. Immunizations recommended: Hepatitis A series and Typhoid (parenteral). Malaria prophylaxis: not indicated Traveler's diarrhea prophylaxis: azithromycin. Total duration of visit: 1 Hour. Total time spent on education, counseling, coordination of care: 30 Minutes.

## 2017-03-23 NOTE — Patient Instructions (Signed)
[facility]                [address], [address]                   [place], [place] [id number]                      Phone: [phone]                        Fax: [phone]   Planned departure date: March 2019         Planned return date: 2 weeks Countries of travel: [place], [place] and [place]   Guidelines for the Prevention & Treatment of Traveler's Diarrhea  Prevention: "Boil it, Peel it, [place] it, or Forget it"   the fewer chances -> lower risk: try to stick to food & water precautions as much as possible"   If it's "piping hot"; it is probably okay, if not, it may not be   Treatment   1) You should always take care to drink lots of fluids in order to avoid dehydration   2) You should bring medications with you in case you come down with a case of diarrhea   3) OTC = bring pepto-bismol - can take with initial abdominal symptoms;                    Imodium - can help slow down your intestinal tract, can help relief cramps                    and diarrhea, can take if no bloody diarrhea  Use azithromycin if needed for traveler's diarrhea  Guidelines for the Prevention of Malaria  Avoidance:  -fewer mosquito bites = lower risk. Mosquitos can bite at night as well as daytime  -cover up (long sleeve clothing), mosquito nets, screens  -Insect repellent for your skin ( DEET containing lotion > 20%): for clothes ( permethrin spray)    Immunizations received today: Hepatitis A series and Typhoid (parenteral)  Future immunizations, if indicated Hepatitis A series in 6 months   Prior to travel:  1) Be sure to pick up appropriate prescriptions, including medicine you take daily. Do not expect to be able to fill your prescriptions abroad.  2) Strongly consider obtaining traveler's insurance, including emergency evacuation insurance. Most plans in the [place] do not cover participants abroad. (see below for resources)    3) Register at the appropriate [place] embassy or consulate with travel dates so they are aware of your presence in-country and for helpful advice during travel using the [employer] Traveler Enrollment Program (STEP, GuyGalaxy.sihttps://step.state.gov/step).  4) Leave contact information with a relative or friend.  5) Keep a Corporate treasurerphotocopy passport, credit cards in case they become lost or stolen  6) Inform your credit card company that you will be travelling abroad   During travel:  1) If you become ill and need medical advice, the U.[place] Well[employer]embassy website of the country you are traveling in general provides a list of English speaking doctors.  We are also available on MyChart for remote consultation if you register prior to travel. 2) Avoid motorcycles or scooters when at all possible. Traffic laws in many countries are lax and accidents occur frequently.  3) Do not take any unnecessary risks that you wouldn't do at home.   Resources:  -  Country specific information: BlindResource.ca or GreenNylon.com.cy  -Press photographer (DEET, mosquito nets): [employer], [employer] store, [employer], [place] insurance options: gatewayplans.com; http://clayton-rivera.info/; travelguard.com or Good Pilgrim's Pride, gninsurance.com or info@gninsurance .com, [id number].   Post Travel:  If you return from your trip ill, call your primary care doctor or our travel clinic @ [phone].   Enjoy your trip and know that with proper pre-travel preparation, most people have an enjoyable and uninterrupted trip!

## 2017-06-01 NOTE — Progress Notes (Signed)
Office Visit Note  Patient: Charlotte Fox             Date of Birth: Apr 01, 1965           MRN: [id number]             PCP: Gweneth Dimitri, MD Referring: Gweneth Dimitri, MD Visit Date: 06/15/2017 Occupation: @    Subjective:  Fatigue    History of Present Illness: Charlotte Fox is a 52 y.o. female with history of fibromyalgia and osteoarthritis.  Patient states she continues to take Robaxin 50 mg as needed for muscle spasms.  She also takes Flexeril at bedtime to help with muscle tenderness and insomnia.  She does Voltaren gel on a as needed basis.  She continues to have generalized muscle aches and muscle tenderness to fibromyalgia.  She states her fatigue continues to worsen.  She states she is been sleeping well at night but wakes up unrested.  She states that she has had having more pain in her left elbow especially when straightening it.  She denies any mechanical symptoms.  States she also has pain in her bilateral hips especially at night.  She states her hands and feet are doing okay denies any joint swelling.   Activities of Daily Living:  Patient reports morning stiffness for 0 none.   Patient Reports nocturnal pain.  Difficulty dressing/grooming: Denies Difficulty climbing stairs: Reports Difficulty getting out of chair: Reports Difficulty using hands for taps, buttons, cutlery, and/or writing: Reports   Review of Systems  Constitutional: Positive for fatigue.  HENT: Negative for mouth sores, trouble swallowing, trouble swallowing, mouth dryness and nose dryness.   Eyes: Negative for pain, visual disturbance and dryness.  Respiratory: Negative for cough, hemoptysis, shortness of breath and difficulty breathing.   Cardiovascular: Negative for chest pain, palpitations, hypertension and swelling in legs/feet.  Gastrointestinal: Negative for blood in stool, constipation and diarrhea.  Endocrine: Negative for increased urination.  Genitourinary: Negative for  difficulty urinating and painful urination.  Musculoskeletal: Positive for arthralgias, joint pain, joint swelling and muscle tenderness. Negative for myalgias, muscle weakness, morning stiffness and myalgias.  Skin: Negative for color change, pallor, rash, hair loss, nodules/bumps, skin tightness, ulcers and sensitivity to sunlight.  Allergic/Immunologic: Negative for susceptible to infections.  Neurological: Negative for dizziness, numbness, headaches and weakness.  Hematological: Negative for bruising/bleeding tendency and swollen glands.  Psychiatric/Behavioral: Positive for sleep disturbance. Negative for depressed mood. The patient is not nervous/anxious.     PMFS History:  Patient Active Problem List   Diagnosis Date Noted  . Travel advice encounter 03/23/2017  . Class 3 severe obesity due to excess calories without serious comorbidity with body mass index (BMI) of 40.0 to 44.9 in adult (HCC) 12/13/2016  . History of bowel resection 12/13/2016  . History of hypertension 12/13/2016  . History of depression 12/13/2016  . History of ankle surgery 12/12/2016  . Fatigue 12/10/2015  . Fibromyalgia 12/09/2015  . Osteoarthritis of both hands 12/09/2015  . Osteoarthritis of right hip 12/09/2015  . Hypertension 12/09/2015  . Elevated cholesterol 12/09/2015  . Migraines 12/09/2015  . Insomnia 12/09/2015  . History of endometriosis 12/09/2015  . Ventral hernia without obstruction or gangrene 09/01/2014  . Mood disorder (HCC) 10/21/2013  . GERD (gastroesophageal reflux disease) 10/21/2013  . Nausea with vomiting 10/21/2013  . Hypokalemia 10/21/2013  . Cellulitis of right leg 10/20/2013  . Cellulitis of right foot 10/20/2013    Past Medical History:  Diagnosis Date  . Acid  reflux   . Anemia   . Arthritis   . Asthma   . Complication of anesthesia    propofol dropped resp during colonoscopy, woke up during procedure  . Depression   . Elevated cholesterol 12/09/2015  . Fibromyalgia    . Headache    hx migraines  . Heart murmur    does not give her any problems  . High cholesterol   . History of endometriosis 12/09/2015  . History of hiatal hernia   . Hypertension   . Insomnia 12/09/2015  . Migraines 12/09/2015  . Osteoarthritis of both hands 12/09/2015  . Osteoarthritis of right hip 12/09/2015   Mild  . Patent foramen ovale    no longer sees cardiology  . Retinal artery occlusion   . Stroke [facility])    retinal artery   several yrs ago,  left eye diminished vision     Family History  Problem Relation Age of Onset  . Hypertension Mother   . Diabetes Mother   . Arthritis Mother   . Hypertension Brother    Past Surgical History:  Procedure Laterality Date  . ABDOMINAL HYSTERECTOMY     appendectomy/ colon  . ANKLE SURGERY     at age 74  . APPENDECTOMY    . CHOLECYSTECTOMY N/A 05/21/2014   Procedure: LAPAROSCOPIC CHOLECYSTECTOMY WITH INTRAOPERATIVE CHOLANGIOGRAM;  Surgeon: Manus Rudd, MD;  Location: [facility] OR;  Service: General;  Laterality: N/A;  . COLON SURGERY     removed 5 in colon during hysterectmy [place] due to mass  . COLONOSCOPY     x 2  . KNEE ARTHROPLASTY    . KNEE SURGERY    . TOE SURGERY    . VENTRAL HERNIA REPAIR  09/01/2014   with mesh  . VENTRAL HERNIA REPAIR N/A 09/01/2014   Procedure: LAPAROSCOPIC VENTRAL HERNIA REPAIR WITH MESH;  Surgeon: Manus Rudd, MD;  Location: [facility] OR;  Service: General;  Laterality: N/A;   Social History   Social History Narrative  . Not on file     Objective: Vital Signs: BP (!) 150/99 (BP Location: Left Arm, Patient Position: Sitting, Cuff Size: Normal)   Pulse 94   Resp 16   Ht 5' 6.5" (1.689 m)   Wt 253 lb (114.8 kg)   LMP 12/31/2011   BMI 40.22 kg/m    Physical Exam  Constitutional: She is oriented to person, place, and time. She appears well-developed and well-nourished.  HENT:  Head: Normocephalic and atraumatic.  Eyes: Conjunctivae and EOM are normal.  Neck: Normal range of motion.    Cardiovascular: Normal rate, regular rhythm, normal heart sounds and intact distal pulses.  Pulmonary/Chest: Effort normal and breath sounds normal.  Abdominal: Soft. Bowel sounds are normal.  Lymphadenopathy:    She has no cervical adenopathy.  Neurological: She is alert and oriented to person, place, and time.  Skin: Skin is warm and dry. Capillary refill takes less than 2 seconds.  Psychiatric: She has a normal mood and affect. Her behavior is normal.  Nursing note and vitals reviewed.    Musculoskeletal Exam: C-spine, thoracic spine, lumbar spine good range of motion.  No midline spinal tenderness.  She has mild tenderness in the bilateral SI joints.  Shoulder joints, elbow joints, wrist joints, MCPs, PIPs, DIPs good range of motion with no synovitis.  She has tenderness of the left elbow joint.  No warmth or effusion noted.  Hip joints good range of motion with some discomfort.  Knee joints, ankle joints, MTPs,  PIPs, DIPs good range of motion with no synovitis.  No warmth or effusion of the knee joints.  She has bilateral knee crepitus.  No tenderness of trochanteric bursa.  CDAI Exam: No CDAI exam completed.    Investigation: No additional findings. CBC Latest Ref Rng & Units 06/09/2016 12/10/2015 12/10/2015  WBC 3.4 - 10.8 x10E3/uL 8.4 7.4 CANCELED  Hemoglobin 11.1 - 15.9 g/dL 16.1 09.6 CANCELED  Hematocrit 34.0 - 46.6 % 39.9 37.6 CANCELED  Platelets 150 - 379 x10E3/uL 320 301 CANCELED   CMP Latest Ref Rng & Units 06/09/2016 12/10/2015 12/10/2015  Glucose 65 - 99 mg/dL 88 045(W) CANCELED  BUN 6 - 24 mg/dL 14 12 CANCELED  Creatinine 0.57 - 1.00 mg/dL 0.98 1.19 CANCELED  Sodium 134 - 144 mmol/L 141 144 CANCELED  Potassium 3.5 - 5.2 mmol/L 4.4 4.2 CANCELED  Chloride 96 - 106 mmol/L 101 102 CANCELED  CO2 18 - 29 mmol/L 25 24 CANCELED  Calcium 8.7 - 10.2 mg/dL 9.3 9.3 CANCELED  Total Protein 6.0 - 8.5 g/dL 6.9 6.6 CANCELED  Total Bilirubin 0.0 - 1.2 mg/dL 0.4 0.4 CANCELED   Alkaline Phos 39 - 117 IU/L 75 71 CANCELED  AST 0 - 40 IU/L 16 20 CANCELED  ALT 0 - 32 IU/L 14 20 CANCELED    Imaging: No results found.  Speciality Comments: No specialty comments available.    Procedures:  No procedures performed Allergies: Erythromycin; Propofol; Sulfa antibiotics; Levaquin [levofloxacin]; Allegra-d [fexofenadine-pseudoephed er]; Azithromycin; Penicillins; and Vicodin [hydrocodone-acetaminophen]   Assessment / Plan:     Visit Diagnoses: Fibromyalgia: She continues to have generalized muscle aches and muscle tenderness.  Her biggest concern is her increased level of fatigue.  She was encouraged to continue to exercise on a regular basis.  She takes Robaxin 500 mg as needed for the daytime for muscle spasms.  She has been taking Flexeril at bedtime.  Due to her taking Tramadol and Prozac we discussed switching her from Flexeril to Zanaflex 4 mg at bedtime.  A prescription for Zanaflex was sent to the pharmacy.   Primary osteoarthritis of both hands: She has PIP and DIP synovial thickening consistent with zoster arthritis of bilateral hands.  Joint protection and muscle strengthening were discussed.  Primary osteoarthritis of right hip: She has good range of motion with some discomfort.  History of depression - She takes Prozac 20 mg po qhs.  History of insomnia: Her insomnia is improved.  She will occasionally take Flexeril at bedtime to help with muscle tenderness and insomnia.  She continues to wake up with significant fatigue which improves as the day progresses.  Other medical conditions are listed as follows:  History of obesity  History of bowel resection  History of hypertension  History of gastroesophageal reflux (GERD)  History of migraine  History of hypercholesterolemia    Orders: No orders of the defined types were placed in this encounter.  No orders of the defined types were placed in this encounter.   Face-to-face time spent with  patient was 30 minutes. >50% of time was spent in counseling and coordination of care.  Follow-Up Instructions: Return in about 6 months (around 12/16/2017) for Fibromyalgia, Osteoarthritis, Rheumatoid arthritis.   Gearldine Bienenstock, PA-C   I examined and evaluated the patient with Sherron Ales PA. The plan of care was discussed as noted above.  Pollyann Savoy, MD  Note - This record has been created using Animal nutritionist.  Chart creation errors have been sought, but may not always  have been located. Such creation errors do not reflect on  the standard of medical care.

## 2017-06-15 ENCOUNTER — Encounter: Payer: Self-pay | Admitting: Rheumatology

## 2017-06-15 ENCOUNTER — Ambulatory Visit: Payer: 59 | Admitting: Rheumatology

## 2017-06-15 VITALS — BP 150/99 | HR 94 | Resp 16 | Ht 66.5 in | Wt 253.0 lb

## 2017-06-15 DIAGNOSIS — Z8669 Personal history of other diseases of the nervous system and sense organs: Secondary | ICD-10-CM

## 2017-06-15 DIAGNOSIS — M1611 Unilateral primary osteoarthritis, right hip: Secondary | ICD-10-CM | POA: Diagnosis not present

## 2017-06-15 DIAGNOSIS — M797 Fibromyalgia: Secondary | ICD-10-CM | POA: Diagnosis not present

## 2017-06-15 DIAGNOSIS — Z87898 Personal history of other specified conditions: Secondary | ICD-10-CM

## 2017-06-15 DIAGNOSIS — M19042 Primary osteoarthritis, left hand: Secondary | ICD-10-CM | POA: Diagnosis not present

## 2017-06-15 DIAGNOSIS — Z8659 Personal history of other mental and behavioral disorders: Secondary | ICD-10-CM

## 2017-06-15 DIAGNOSIS — M19041 Primary osteoarthritis, right hand: Secondary | ICD-10-CM | POA: Diagnosis not present

## 2017-06-15 DIAGNOSIS — Z8639 Personal history of other endocrine, nutritional and metabolic disease: Secondary | ICD-10-CM

## 2017-06-15 DIAGNOSIS — Z8679 Personal history of other diseases of the circulatory system: Secondary | ICD-10-CM

## 2017-06-15 DIAGNOSIS — Z9049 Acquired absence of other specified parts of digestive tract: Secondary | ICD-10-CM | POA: Diagnosis not present

## 2017-06-15 DIAGNOSIS — Z8719 Personal history of other diseases of the digestive system: Secondary | ICD-10-CM

## 2017-06-15 DIAGNOSIS — Z9889 Other specified postprocedural states: Secondary | ICD-10-CM

## 2017-06-15 MED ORDER — TIZANIDINE HCL 4 MG PO TABS: 4.0000 mg | ORAL_TABLET | Freq: Every evening | ORAL | 2 refills | Status: DC | PRN

## 2017-12-05 NOTE — Progress Notes (Signed)
Office Visit Note  Patient: Charlotte Fox             Date of Birth: [birth year]           MRN: [id number]             PCP: Gweneth Dimitri, MD Referring: Gweneth Dimitri, MD Visit Date: 12/19/2017 Occupation: @GUAROCC @  Subjective:  Left elbow pain   History of Present Illness: Charlotte Fox is a 52 y.o. female with history of fibromyalgia and osteoarthritis. She takes Zanaflex 4 mg 1 tablet by mouth at bedtime for muscle spasms. She uses voltaren gel topically PRN and continues to take tart cherry. She continues to have generalized muscle aches and muscle tenderness.  She has trapezius tension and tenderness. She continues to have left trochanteric bursitis and intermittent right knee pain.  She has been having increased discomfort of the left elbow joint.  She denies any numbness or tingling but she has radiation of pain with extension of the elbow joint. Her level of fatigue has been stable.  She has chronic insomnia and wakes up 1-2 times per night. She sleeps on a heated mattress pad, which helps with myalgias. She continues to have 1-2 migraines a month and takes tramadol for management.  She typically takes Advil or tylenol for pain relief.    Activities of Daily Living:  Patient reports morning stiffness for 30-45  minutes.   Patient Reports nocturnal pain.  Difficulty dressing/grooming: Denies Difficulty climbing stairs: Denies Difficulty getting out of chair: Denies Difficulty using hands for taps, buttons, cutlery, and/or writing: Denies  Review of Systems  Constitutional: Positive for fatigue.  HENT: Negative for mouth sores, mouth dryness and nose dryness.   Eyes: Positive for dryness. Negative for pain and visual disturbance.  Respiratory: Negative for cough, hemoptysis, shortness of breath and difficulty breathing.   Cardiovascular: Negative for chest pain, palpitations, hypertension and swelling in legs/feet.  Gastrointestinal: Negative for blood in stool,  constipation and diarrhea.  Endocrine: Negative for increased urination.  Genitourinary: Negative for painful urination.  Musculoskeletal: Positive for arthralgias, joint pain, myalgias, morning stiffness, muscle tenderness and myalgias. Negative for joint swelling and muscle weakness.  Skin: Negative for color change, pallor, rash, hair loss, nodules/bumps, skin tightness, ulcers and sensitivity to sunlight.  Allergic/Immunologic: Negative for susceptible to infections.  Neurological: Positive for headaches. Negative for dizziness, numbness and weakness.  Hematological: Negative for swollen glands.  Psychiatric/Behavioral: Positive for depressed mood and sleep disturbance. The patient is not nervous/anxious.     PMFS History:  Patient Active Problem List   Diagnosis Date Noted  . Travel advice encounter 03/23/2017  . Class 3 severe obesity due to excess calories without serious comorbidity with body mass index (BMI) of 40.0 to 44.9 in adult (HCC) 12/13/2016  . History of bowel resection 12/13/2016  . History of hypertension 12/13/2016  . History of depression 12/13/2016  . History of ankle surgery 12/12/2016  . Fatigue 12/10/2015  . Fibromyalgia 12/09/2015  . Osteoarthritis of both hands 12/09/2015  . Osteoarthritis of right hip 12/09/2015  . Hypertension 12/09/2015  . Elevated cholesterol 12/09/2015  . Migraines 12/09/2015  . Insomnia 12/09/2015  . History of endometriosis 12/09/2015  . Ventral hernia without obstruction or gangrene 09/01/2014  . Mood disorder (HCC) 10/21/2013  . GERD (gastroesophageal reflux disease) 10/21/2013  . Nausea with vomiting 10/21/2013  . Hypokalemia 10/21/2013  . Cellulitis of right leg 10/20/2013  . Cellulitis of right foot 10/20/2013    Past Medical  History:  Diagnosis Date  . Acid reflux   . Anemia   . Arthritis   . Asthma   . Complication of anesthesia    propofol dropped resp during colonoscopy, woke up during procedure  . Depression     . Elevated cholesterol 12/09/2015  . Fibromyalgia   . Headache    hx migraines  . Heart murmur    does not give her any problems  . High cholesterol   . History of endometriosis 12/09/2015  . History of hiatal hernia   . Hypertension   . Insomnia 12/09/2015  . Migraines 12/09/2015  . Osteoarthritis of both hands 12/09/2015  . Osteoarthritis of right hip 12/09/2015   Mild  . Patent foramen ovale    no longer sees cardiology  . Retinal artery occlusion   . Stroke [facility])    retinal artery   several yrs ago,  left eye diminished vision     Family History  Problem Relation Age of Onset  . Hypertension Mother   . Diabetes Mother   . Arthritis Mother   . Hypertension Brother    Past Surgical History:  Procedure Laterality Date  . ABDOMINAL HYSTERECTOMY     appendectomy/ colon  . ANKLE SURGERY     at age 46  . APPENDECTOMY    . CHOLECYSTECTOMY N/A 05/21/2014   Procedure: LAPAROSCOPIC CHOLECYSTECTOMY WITH INTRAOPERATIVE CHOLANGIOGRAM;  Surgeon: Manus Rudd, MD;  Location: [facility] OR;  Service: General;  Laterality: N/A;  . COLON SURGERY     removed 5 in colon during hysterectmy [place] due to mass  . COLONOSCOPY     x 2  . KNEE ARTHROPLASTY    . KNEE SURGERY    . TOE SURGERY    . VENTRAL HERNIA REPAIR  09/01/2014   with mesh  . VENTRAL HERNIA REPAIR N/A 09/01/2014   Procedure: LAPAROSCOPIC VENTRAL HERNIA REPAIR WITH MESH;  Surgeon: Manus Rudd, MD;  Location: [facility] OR;  Service: General;  Laterality: N/A;   Social History   Social History Narrative  . Not on file    Objective: Vital Signs: BP 124/78 (BP Location: Left Arm, Patient Position: Sitting, Cuff Size: Large)   Pulse 86   Resp 15   Ht 5' 6.5" (1.689 m)   Wt 259 lb 6.4 oz (117.7 kg)   LMP 12/31/2011   BMI 41.24 kg/m    Physical Exam  Constitutional: She is oriented to person, place, and time. She appears well-developed and well-nourished.  HENT:  Head: Normocephalic and atraumatic.  Eyes: Conjunctivae and  EOM are normal.  Neck: Normal range of motion.  Cardiovascular: Normal rate, regular rhythm, normal heart sounds and intact distal pulses.  Pulmonary/Chest: Effort normal and breath sounds normal.  Abdominal: Soft. Bowel sounds are normal.  Lymphadenopathy:    She has no cervical adenopathy.  Neurological: She is alert and oriented to person, place, and time.  Skin: Skin is warm and dry. Capillary refill takes less than 2 seconds.  Psychiatric: She has a normal mood and affect. Her behavior is normal.  Nursing note and vitals reviewed.    Musculoskeletal Exam:C-spine, thoracic spine, and lumbar spine good ROM with no synovitis.  Trapezius muscle tension and tenderness. Positive tender points. Shoulder joints good ROM with no discomfort.  Full ROM of both elbow joints. Tenderness along the left elbow joint line.  No tenderness of medial or lateral epicondyle.  Wrist joints, MCPs, PIPs, and DIPs good ROM with no synovitis.  Complete fist formation.  Hip joints, knee joints, ankle joints, MTPs, PIPs, and DIPs good ROM with no synovitis.  No warmth or effusion of knee joints.  No tenderness or swelling of ankle joints.  Tenderness of left trochanteric bursa.   CDAI Exam: CDAI Score: Not documented Patient Global Assessment: Not documented; Provider Global Assessment: Not documented Swollen: Not documented; Tender: Not documented Joint Exam   Not documented   There is currently no information documented on the homunculus. Go to the Rheumatology activity and complete the homunculus joint exam.  Investigation: No additional findings.  Imaging: No results found.  Recent Labs: Lab Results  Component Value Date   WBC 8.4 06/09/2016   HGB 12.9 06/09/2016   PLT 320 06/09/2016   NA 141 06/09/2016   K 4.4 06/09/2016   CL 101 06/09/2016   CO2 25 06/09/2016   GLUCOSE 88 06/09/2016   BUN 14 06/09/2016   CREATININE 0.88 06/09/2016   BILITOT 0.4 06/09/2016   ALKPHOS 75 06/09/2016   AST 16  06/09/2016   ALT 14 06/09/2016   PROT 6.9 06/09/2016   ALBUMIN 4.3 06/09/2016   CALCIUM 9.3 06/09/2016   GFRAA 89 06/09/2016    Speciality Comments: No specialty comments available.  Procedures:  No procedures performed Allergies: Erythromycin; Propofol; Sulfa antibiotics; Levaquin [levofloxacin]; Allegra-d [fexofenadine-pseudoephed er]; Azithromycin; Penicillins; and Vicodin [hydrocodone-acetaminophen]   Assessment / Plan:     Visit Diagnoses: Fibromyalgia - She has positive tender points on exam.  She has generalized muscle aches and muscle tenderness.  She takes Zanaflex 4 mg 1 tablet by mouth at bedtime PRN for muscle spasms.  She continues to have trapezius muscle tensions and spasms.  She has left trochanteric bursitis and she performs stretching exercises on a regular basis.  She takes advil or tylenol PRN for pain relief.  She continues to have 1-2 migraines per month and takes tramadol for management of migraine headaches. Her level of fatigue has been stable. Overall she has been sleeping well at night.  She has a heated mattress pad which helps with myalgias and night time pain.  She does not need any refills at this time.  She will follow up in 6 months.   Primary osteoarthritis of both hands: She has PIP and DIP synovial thickening consistent with osteoarthritis of both hands.  No synovitis noted.  Complete fist formation bilaterally.  Joint protection and muscle strengthening were discussed.   Primary osteoarthritis of right hip: She has good ROM with no discomfort at this time.   History of depression - She takes Prozac 20 mg 1 tablet by mouth daily.  Her depression has been well management on current treatment.   History of insomnia: She takes Zanaflex 4 mg 1 tablet by mouth at bedtime PRN for muscle spasms.  She has been sleeping well at night and typically only wakes up 1-2 times per night.  Good sleep hygiene was discussed.   Pain in left elbow - She has been having left  elbow joint pain intermittently for the past 1 year.  She is tender along the left elbow joint line. She has radiating pain with extension of the left elbow.  No synovitis noted. A x-ray of the left elbow joint was obtained today.  A spur was noted. Plan: XR Elbow 2 Views Left   History of migraine: She has 1-2 migraines per month.  She takes tramadol for management of migraine headaches.  She no longer is followed by a neurologist. Her PCP manages her migraine headaches.  Other medical conditions are listed as follows:   History of bowel resection  History of hypertension  History of gastroesophageal reflux (GERD)  History of hypercholesterolemia   Orders: Orders Placed This Encounter  Procedures  . XR Elbow 2 Views Left   No orders of the defined types were placed in this encounter.   Face-to-face time spent with patient was 30 minutes. Greater than 50% of time was spent in counseling and coordination of care.  Follow-Up Instructions: Return in about 6 months (around 06/19/2018) for Fibromyalgia, Osteoarthritis.   Gearldine Bienenstock, PA-C   I examined and evaluated the patient with Sherron Ales PA.  Prep was noted over the olecranon process.  I discussed the x-ray findings with the patient.  And advised her not to lean over her elbow.  The plan of care was discussed as noted above.  Pollyann Savoy, MD Note - This record has been created using Animal nutritionist.  Chart creation errors have been sought, but may not always  have been located. Such creation errors do not reflect on  the standard of medical care.

## 2017-12-19 ENCOUNTER — Ambulatory Visit: Payer: Managed Care, Other (non HMO) | Admitting: Physician Assistant

## 2017-12-19 ENCOUNTER — Ambulatory Visit (INDEPENDENT_AMBULATORY_CARE_PROVIDER_SITE_OTHER): Payer: Managed Care, Other (non HMO)

## 2017-12-19 ENCOUNTER — Encounter: Payer: Self-pay | Admitting: Physician Assistant

## 2017-12-19 VITALS — BP 124/78 | HR 86 | Resp 15 | Ht 66.5 in | Wt 259.4 lb

## 2017-12-19 DIAGNOSIS — Z8719 Personal history of other diseases of the digestive system: Secondary | ICD-10-CM

## 2017-12-19 DIAGNOSIS — Z87898 Personal history of other specified conditions: Secondary | ICD-10-CM

## 2017-12-19 DIAGNOSIS — M19041 Primary osteoarthritis, right hand: Secondary | ICD-10-CM | POA: Diagnosis not present

## 2017-12-19 DIAGNOSIS — Z9049 Acquired absence of other specified parts of digestive tract: Secondary | ICD-10-CM

## 2017-12-19 DIAGNOSIS — Z8659 Personal history of other mental and behavioral disorders: Secondary | ICD-10-CM | POA: Diagnosis not present

## 2017-12-19 DIAGNOSIS — M25522 Pain in left elbow: Secondary | ICD-10-CM | POA: Diagnosis not present

## 2017-12-19 DIAGNOSIS — M797 Fibromyalgia: Secondary | ICD-10-CM | POA: Diagnosis not present

## 2017-12-19 DIAGNOSIS — Z8669 Personal history of other diseases of the nervous system and sense organs: Secondary | ICD-10-CM

## 2017-12-19 DIAGNOSIS — M1611 Unilateral primary osteoarthritis, right hip: Secondary | ICD-10-CM | POA: Diagnosis not present

## 2017-12-19 DIAGNOSIS — Z8639 Personal history of other endocrine, nutritional and metabolic disease: Secondary | ICD-10-CM

## 2017-12-19 DIAGNOSIS — M19042 Primary osteoarthritis, left hand: Secondary | ICD-10-CM

## 2017-12-19 DIAGNOSIS — Z8679 Personal history of other diseases of the circulatory system: Secondary | ICD-10-CM

## 2018-03-01 ENCOUNTER — Other Ambulatory Visit: Payer: Self-pay | Admitting: Family Medicine

## 2018-03-01 DIAGNOSIS — Z1231 Encounter for screening mammogram for malignant neoplasm of breast: Secondary | ICD-10-CM

## 2018-03-29 ENCOUNTER — Ambulatory Visit
Admission: RE | Admit: 2018-03-29 | Discharge: 2018-03-29 | Disposition: A | Discharge status: Home / Self Care | Payer: Managed Care, Other (non HMO) | Source: Ambulatory Visit | Attending: Family Medicine | Admitting: Family Medicine

## 2018-03-29 DIAGNOSIS — Z1231 Encounter for screening mammogram for malignant neoplasm of breast: Secondary | ICD-10-CM

## 2018-06-12 NOTE — Progress Notes (Signed)
Office Visit Note  Patient: Charlotte Fox             Date of Birth: 01/07/1966           MRN: [id number]             PCP: Gweneth DimitriMcNeill, Wendy, MD Referring: Gweneth DimitriMcNeill, Wendy, MD Visit Date: 06/20/2018 Occupation: @GUAROCC @  Subjective:  Pain in both hands   History of Present Illness: Charlotte DuosChristine M Fritts is a 53 y.o. female with history of fibromyalgia and osteoarthritis.  She reports overall her fibromyalgia has been well controlled.  She has not had any recent flares.  She has some generalized muscle aches and muscle tenderness.  She has been working from home, and she has had to use a different chair and has been sitting more.  She is experiencing left trochanteric bursitis.  She performs stretching exercises.  She states her right hip joint is doing well. She has been experiencing pain and stiffness in both hands. She denies any other joint pain or joint swelling.  Her level of fatigue has been stable.  She has been sleeping 7-8 hours per night.  She takes zanaflex 4 mg po at bedtime prn.  She does not need any refills at this time.   Activities of Daily Living:  Patient reports morning stiffness for 20 minutes.   Patient Reports nocturnal pain.  Difficulty dressing/grooming: Denies Difficulty climbing stairs: Denies Difficulty getting out of chair: Denies Difficulty using hands for taps, buttons, cutlery, and/or writing: Denies  Review of Systems  Constitutional: Positive for fatigue.  HENT: Negative for mouth sores, mouth dryness and nose dryness.   Eyes: Positive for dryness. Negative for pain and visual disturbance.  Respiratory: Negative for cough, hemoptysis, shortness of breath and difficulty breathing.   Cardiovascular: Negative for chest pain, palpitations, hypertension and swelling in legs/feet.  Gastrointestinal: Negative for blood in stool, constipation and diarrhea.  Endocrine: Negative for increased urination.  Genitourinary: Negative for painful urination.   Musculoskeletal: Positive for myalgias, morning stiffness, muscle tenderness and myalgias. Negative for arthralgias, joint pain, joint swelling and muscle weakness.  Skin: Negative for color change, pallor, rash, hair loss, nodules/bumps, skin tightness, ulcers and sensitivity to sunlight.  Allergic/Immunologic: Negative for susceptible to infections.  Neurological: Positive for headaches. Negative for dizziness, numbness and weakness.  Hematological: Negative for swollen glands.  Psychiatric/Behavioral: Positive for depressed mood. Negative for sleep disturbance. The patient is not nervous/anxious.     PMFS History:  Patient Active Problem List   Diagnosis Date Noted  . Travel advice encounter 03/23/2017  . Class 3 severe obesity due to excess calories without serious comorbidity with body mass index (BMI) of 40.0 to 44.9 in adult (HCC) 12/13/2016  . History of bowel resection 12/13/2016  . History of hypertension 12/13/2016  . History of depression 12/13/2016  . History of ankle surgery 12/12/2016  . Fatigue 12/10/2015  . Fibromyalgia 12/09/2015  . Osteoarthritis of both hands 12/09/2015  . Osteoarthritis of right hip 12/09/2015  . Hypertension 12/09/2015  . Elevated cholesterol 12/09/2015  . Migraines 12/09/2015  . Insomnia 12/09/2015  . History of endometriosis 12/09/2015  . Ventral hernia without obstruction or gangrene 09/01/2014  . Mood disorder (HCC) 10/21/2013  . GERD (gastroesophageal reflux disease) 10/21/2013  . Nausea with vomiting 10/21/2013  . Hypokalemia 10/21/2013  . Cellulitis of right leg 10/20/2013  . Cellulitis of right foot 10/20/2013    Past Medical History:  Diagnosis Date  . Acid reflux   . Anemia   .  Arthritis   . Asthma   . Complication of anesthesia    propofol dropped resp during colonoscopy, woke up during procedure  . Depression   . Elevated cholesterol 12/09/2015  . Fibromyalgia   . Headache    hx migraines  . Heart murmur    does not  give her any problems  . High cholesterol   . History of endometriosis 12/09/2015  . History of hiatal hernia   . Hypertension   . Insomnia 12/09/2015  . Migraines 12/09/2015  . Osteoarthritis of both hands 12/09/2015  . Osteoarthritis of right hip 12/09/2015   Mild  . Patent foramen ovale    no longer sees cardiology  . Retinal artery occlusion   . Stroke [facility])    retinal artery   several yrs ago,  left eye diminished vision     Family History  Problem Relation Age of Onset  . Hypertension Mother   . Diabetes Mother   . Arthritis Mother   . Hypertension Brother    Past Surgical History:  Procedure Laterality Date  . ABDOMINAL HYSTERECTOMY     appendectomy/ colon  . ANKLE SURGERY     at age 11  . APPENDECTOMY    . CHOLECYSTECTOMY N/A 05/21/2014   Procedure: LAPAROSCOPIC CHOLECYSTECTOMY WITH INTRAOPERATIVE CHOLANGIOGRAM;  Surgeon: Manus Rudd, MD;  Location: [facility] OR;  Service: General;  Laterality: N/A;  . COLON SURGERY     removed 5 in colon during hysterectmy [place] due to mass  . COLONOSCOPY     x 2  . KNEE ARTHROPLASTY    . KNEE SURGERY    . TOE SURGERY    . VENTRAL HERNIA REPAIR  09/01/2014   with mesh  . VENTRAL HERNIA REPAIR N/A 09/01/2014   Procedure: LAPAROSCOPIC VENTRAL HERNIA REPAIR WITH MESH;  Surgeon: Manus Rudd, MD;  Location: [facility] OR;  Service: General;  Laterality: N/A;   Social History   Social History Narrative  . Not on file   Immunization History  Administered Date(s) Administered  . Hepatitis A, Adult 03/23/2017  . Hepatitis B 06/05/1997, 07/17/1997  . Pneumococcal Polysaccharide-23 01/10/2016  . Td 05/30/2001  . Tdap 01/06/2009  . Typhoid Inactivated 03/23/2017     Objective: Vital Signs: BP (!) 147/94 (BP Location: Left Arm, Patient Position: Sitting, Cuff Size: Large)   Pulse 83   Resp 14   Ht  (1.676 m)   Wt 262 lb 12.8 oz (119.2 kg)   LMP 12/31/2011   BMI 42.42 kg/m    Physical Exam Vitals signs and nursing note  reviewed.  Constitutional:      Appearance: She is well-developed.  HENT:     Head: Normocephalic and atraumatic.  Eyes:     Conjunctiva/sclera: Conjunctivae normal.  Neck:     Musculoskeletal: Normal range of motion.  Cardiovascular:     Rate and Rhythm: Normal rate and regular rhythm.     Heart sounds: Normal heart sounds.  Pulmonary:     Effort: Pulmonary effort is normal.     Breath sounds: Normal breath sounds.  Abdominal:     General: Bowel sounds are normal.     Palpations: Abdomen is soft.  Lymphadenopathy:     Cervical: No cervical adenopathy.  Skin:    General: Skin is warm and dry.     Capillary Refill: Capillary refill takes less than 2 seconds.  Neurological:     Mental Status: She is alert and oriented to person, place, and time.  Psychiatric:  Behavior: Behavior normal.      Musculoskeletal Exam: C-spine, thoracic spine, and lumbar spine good ROM.  No midline spinal tenderness.  No SI joint tenderness.  Shoulder joints, elbow joints, wrist joints, MCPs, PIPs, and DIPs good ROM with no synovitis.  Complete fist formation bilaterally.  Hip joints, knee joints, ankle joints, MTPs, PIPs, and DIPs good ROM with no synovitis.  No warmth or effusion of knee joints.  No tenderness or swelling of ankle joints.  Tenderness over the left trochanteric bursa.   CDAI Exam: CDAI Score: Not documented Patient Global Assessment: Not documented; Provider Global Assessment: Not documented Swollen: Not documented; Tender: Not documented Joint Exam   Not documented   There is currently no information documented on the homunculus. Go to the Rheumatology activity and complete the homunculus joint exam.  Investigation: No additional findings.  Imaging: No results found.  Recent Labs: Lab Results  Component Value Date   WBC 8.4 06/09/2016   HGB 12.9 06/09/2016   PLT 320 06/09/2016   NA 141 06/09/2016   K 4.4 06/09/2016   CL 101 06/09/2016   CO2 25 06/09/2016    GLUCOSE 88 06/09/2016   BUN 14 06/09/2016   CREATININE 0.88 06/09/2016   BILITOT 0.4 06/09/2016   ALKPHOS 75 06/09/2016   AST 16 06/09/2016   ALT 14 06/09/2016   PROT 6.9 06/09/2016   ALBUMIN 4.3 06/09/2016   CALCIUM 9.3 06/09/2016   GFRAA 89 06/09/2016    Speciality Comments: No specialty comments available.  Procedures:  No procedures performed Allergies: Erythromycin; Propofol; Sulfa antibiotics; Levaquin [levofloxacin]; Allegra-d [fexofenadine-pseudoephed er]; Azithromycin; Penicillins; and Vicodin [hydrocodone-acetaminophen]   Assessment / Plan:     Visit Diagnoses: Fibromyalgia: Her fibromyalgia has been well controlled.  She has not had any recent flares.  She has hypermobility of joints on exam.  She has some generalized muscle tenderness.  She has been working from home, so she has been using a different desk chair and sitting more, which has exacerbated left trochanteric bursitis.  She has been performing stretching exercises daily.  A few exercises were demonstrated to her in the office today.  She declined a cortisone injection.  She will continue taking Zanaflex 4 mg po at bedtime prn. She does not need any refills.  She has been sleeping well at night 7-8 hours and her level of fatigue has been stable.  She was encouraged to stay active and exercise regularly.    Primary osteoarthritis of both hands: She has no tenderness or synovitis.  She has complete fist formation bilaterally.  Joint protection and muscle strengthening were discussed.   Primary osteoarthritis of right hip: She has good ROM with no discomfort.    History of insomnia: She has been sleeping well at night.  She sleeps about 7-8 hours.  Other medical conditions are listed as follows:   History of depression  History of bowel resection  History of hypertension  History of gastroesophageal reflux (GERD)  History of migraine  History of hypercholesterolemia   Orders: No orders of the defined types  were placed in this encounter.  No orders of the defined types were placed in this encounter.   Face-to-face time spent with patient was 20 minutes. Greater than 50% of time was spent in counseling and coordination of care.  Follow-Up Instructions: Return in about 6 months (around 12/21/2018) for Fibromyalgia, Osteoarthritis.   Gearldine Bienenstock, PA-C  Note - This record has been created using Dragon software.  Chart creation errors  have been sought, but may not always  have been located. Such creation errors do not reflect on  the standard of medical care.

## 2018-06-20 ENCOUNTER — Other Ambulatory Visit: Payer: Self-pay

## 2018-06-20 ENCOUNTER — Ambulatory Visit: Payer: Managed Care, Other (non HMO) | Admitting: Physician Assistant

## 2018-06-20 ENCOUNTER — Encounter: Payer: Self-pay | Admitting: Physician Assistant

## 2018-06-20 VITALS — BP 147/94 | HR 83 | Resp 14 | Ht 66.0 in | Wt 262.8 lb

## 2018-06-20 DIAGNOSIS — Z8639 Personal history of other endocrine, nutritional and metabolic disease: Secondary | ICD-10-CM

## 2018-06-20 DIAGNOSIS — Z87898 Personal history of other specified conditions: Secondary | ICD-10-CM | POA: Diagnosis not present

## 2018-06-20 DIAGNOSIS — Z8659 Personal history of other mental and behavioral disorders: Secondary | ICD-10-CM

## 2018-06-20 DIAGNOSIS — M19041 Primary osteoarthritis, right hand: Secondary | ICD-10-CM

## 2018-06-20 DIAGNOSIS — Z9049 Acquired absence of other specified parts of digestive tract: Secondary | ICD-10-CM

## 2018-06-20 DIAGNOSIS — M797 Fibromyalgia: Secondary | ICD-10-CM

## 2018-06-20 DIAGNOSIS — M1611 Unilateral primary osteoarthritis, right hip: Secondary | ICD-10-CM

## 2018-06-20 DIAGNOSIS — M19042 Primary osteoarthritis, left hand: Secondary | ICD-10-CM

## 2018-06-20 DIAGNOSIS — Z8719 Personal history of other diseases of the digestive system: Secondary | ICD-10-CM

## 2018-06-20 DIAGNOSIS — Z8679 Personal history of other diseases of the circulatory system: Secondary | ICD-10-CM

## 2018-06-20 DIAGNOSIS — Z8669 Personal history of other diseases of the nervous system and sense organs: Secondary | ICD-10-CM

## 2018-12-05 NOTE — Progress Notes (Signed)
Office Visit Note  Patient: Charlotte Fox             Date of Birth: 02/15/1965           MRN: [id number]             PCP: Cari Caraway, MD Referring: Cari Caraway, MD Visit Date: 12/19/2018 Occupation: @GUAROCC @  Subjective:  Generalized pain   History of Present Illness: Charlotte Fox is a 53 y.o. female with history of fibromyalgia and osteoarthritis. She continues to have generalized pain in bilateral upper extremities and lower back.  She has constant pain in the right shoulder joint.  She has been taking Robaxin 500 mg BID prn and zanaflex 4 mg po at bedtime for muscle spasms.  She takes tramadol very sparingly for the treatment of migraines.  She has been having increased pain in both hands, but she denies any joint swelling.  She has not had any difficulty sleeping at night.  She has been unable to use the CPAP.  She has been having increased fatigue recently.   Activities of Daily Living:  Patient reports morning stiffness for 30-60 minutes.   Patient Reports nocturnal pain.  Difficulty dressing/grooming: Denies Difficulty climbing stairs: Denies Difficulty getting out of chair: Denies Difficulty using hands for taps, buttons, cutlery, and/or writing: Reports  Review of Systems  Constitutional: Positive for fatigue.  HENT: Negative for mouth sores, mouth dryness and nose dryness.   Eyes: Positive for dryness.  Respiratory: Negative for shortness of breath and difficulty breathing.   Cardiovascular: Negative for chest pain and palpitations.  Gastrointestinal: Negative for blood in stool, constipation and diarrhea.  Endocrine: Negative for increased urination.  Genitourinary: Negative for difficulty urinating.  Musculoskeletal: Positive for arthralgias, joint pain, joint swelling and morning stiffness.  Skin: Negative for rash and hair loss.  Allergic/Immunologic: Negative for susceptible to infections.  Neurological: Positive for numbness and headaches.  Negative for dizziness, memory loss and weakness.  Hematological: Negative for bruising/bleeding tendency.  Psychiatric/Behavioral: Positive for depressed mood. Negative for confusion. The patient is nervous/anxious.     PMFS History:  Patient Active Problem List   Diagnosis Date Noted  . Travel advice encounter 03/23/2017  . Class 3 severe obesity due to excess calories without serious comorbidity with body mass index (BMI) of 40.0 to 44.9 in adult ([place]) 12/13/2016  . History of bowel resection 12/13/2016  . History of hypertension 12/13/2016  . History of depression 12/13/2016  . History of ankle surgery 12/12/2016  . Fatigue 12/10/2015  . Fibromyalgia 12/09/2015  . Osteoarthritis of both hands 12/09/2015  . Osteoarthritis of right hip 12/09/2015  . Hypertension 12/09/2015  . Elevated cholesterol 12/09/2015  . Migraines 12/09/2015  . Insomnia 12/09/2015  . History of endometriosis 12/09/2015  . Ventral hernia without obstruction or gangrene 09/01/2014  . Mood disorder ([place]) 10/21/2013  . GERD (gastroesophageal reflux disease) 10/21/2013  . Nausea with vomiting 10/21/2013  . Hypokalemia 10/21/2013  . Cellulitis of right leg 10/20/2013  . Cellulitis of right foot 10/20/2013    Past Medical History:  Diagnosis Date  . Acid reflux   . Anemia   . Arthritis   . Asthma   . Complication of anesthesia    propofol dropped resp during colonoscopy, woke up during procedure  . Depression   . Elevated cholesterol 12/09/2015  . Fibromyalgia   . Headache    hx migraines  . Heart murmur    does not give her any problems  .  High cholesterol   . History of endometriosis 12/09/2015  . History of hiatal hernia   . Hypertension   . Insomnia 12/09/2015  . Migraines 12/09/2015  . Osteoarthritis of both hands 12/09/2015  . Osteoarthritis of right hip 12/09/2015   Mild  . Patent foramen ovale    no longer sees cardiology  . Retinal artery occlusion   . Stroke [facility])    retinal artery    several yrs ago,  left eye diminished vision     Family History  Problem Relation Age of Onset  . Hypertension Mother   . Diabetes Mother   . Arthritis Mother   . Hypertension Brother    Past Surgical History:  Procedure Laterality Date  . ABDOMINAL HYSTERECTOMY     appendectomy/ colon  . ANKLE SURGERY     at age 53  . APPENDECTOMY    . CHOLECYSTECTOMY N/A 05/21/2014   Procedure: LAPAROSCOPIC CHOLECYSTECTOMY WITH INTRAOPERATIVE CHOLANGIOGRAM;  Surgeon: Manus RuddMatthew Tsuei, MD;  Location: [facility] OR;  Service: General;  Laterality: N/A;  . COLON SURGERY     removed 5 in colon during hysterectmy [place] due to mass  . COLONOSCOPY     x 2  . KNEE ARTHROPLASTY    . KNEE SURGERY    . TOE SURGERY    . VENTRAL HERNIA REPAIR  09/01/2014   with mesh  . VENTRAL HERNIA REPAIR N/A 09/01/2014   Procedure: LAPAROSCOPIC VENTRAL HERNIA REPAIR WITH MESH;  Surgeon: Manus RuddMatthew Tsuei, MD;  Location: [facility] OR;  Service: General;  Laterality: N/A;   Social History   Social History Narrative  . Not on file   Immunization History  Administered Date(s) Administered  . Hepatitis A, Adult 03/23/2017  . Hepatitis B 06/05/1997, 07/17/1997  . Pneumococcal Polysaccharide-23 01/10/2016  . Td 05/30/2001  . Tdap 01/06/2009  . Typhoid Inactivated 03/23/2017     Objective: Vital Signs: BP 138/84 (BP Location: Left Arm, Patient Position: Sitting, Cuff Size: Large)   Pulse 79   Resp 15   Ht 5\' 6"  (1.676 m)   Wt 259 lb 9.6 oz (117.8 kg)   LMP 12/31/2011   BMI 41.90 kg/m    Physical Exam Vitals signs and nursing note reviewed.  Constitutional:      Appearance: She is well-developed.  HENT:     Head: Normocephalic and atraumatic.  Eyes:     Conjunctiva/sclera: Conjunctivae normal.  Neck:     Musculoskeletal: Normal range of motion.  Cardiovascular:     Rate and Rhythm: Normal rate and regular rhythm.     Heart sounds: Normal heart sounds.  Pulmonary:     Effort: Pulmonary effort is normal.     Breath  sounds: Normal breath sounds.  Abdominal:     General: Bowel sounds are normal.     Palpations: Abdomen is soft.  Lymphadenopathy:     Cervical: No cervical adenopathy.  Skin:    General: Skin is warm and dry.     Capillary Refill: Capillary refill takes less than 2 seconds.  Neurological:     Mental Status: She is alert and oriented to person, place, and time.  Psychiatric:        Behavior: Behavior normal.      Musculoskeletal Exam: Generalized hyperalgesia and positive tender points.  C-spine slightly limited ROM. Trapezius muscle tension and tenderness noted.  Shoulder joints, elbow joints, wrist joints, MCPs, PIPs, and DIPs good ROM with no synovitis.  Mild PIP and DIP synovial thickening.  Complete fist formation  bilaterally.  Hip joints, knee joints, ankle joints, MTPs, PIPs, and DIPs good ROM with no synovitis.  No warmth or effusion of knee joints.  No tenderness or swelling of ankle joints.   CDAI Exam: CDAI Score: - Patient Global: -; Provider Global: - Swollen: -; Tender: - Joint Exam   No joint exam has been documented for this visit   There is currently no information documented on the homunculus. Go to the Rheumatology activity and complete the homunculus joint exam.  Investigation: No additional findings.  Imaging: No results found.  Recent Labs: Lab Results  Component Value Date   WBC 8.4 06/09/2016   HGB 12.9 06/09/2016   PLT 320 06/09/2016   NA 141 06/09/2016   K 4.4 06/09/2016   CL 101 06/09/2016   CO2 25 06/09/2016   GLUCOSE 88 06/09/2016   BUN 14 06/09/2016   CREATININE 0.88 06/09/2016   BILITOT 0.4 06/09/2016   ALKPHOS 75 06/09/2016   AST 16 06/09/2016   ALT 14 06/09/2016   PROT 6.9 06/09/2016   ALBUMIN 4.3 06/09/2016   CALCIUM 9.3 06/09/2016   GFRAA 89 06/09/2016    Speciality Comments: No specialty comments available.  Procedures:  No procedures performed Allergies: Erythromycin, Propofol, Sulfa antibiotics, Levaquin  [levofloxacin], Allegra-d [fexofenadine-pseudoephed er], Azithromycin, Penicillins, and Vicodin [hydrocodone-acetaminophen]     Assessment / Plan:     Visit Diagnoses: Fibromyalgia: She has generalized hyperalgesia and positive tender points on exam.  She has generalized muscle aches and muscle tenderness in bilateral upper extremities. She has trapezius muscle tension and tenderness bilaterally.  She declined trigger point injections.  She was encouraged to perform neck exercises. She continues to have chronic lower back pain.  She takes Robaxin 500 mg 1 to 2 tablets daily and Zanaflex 4 mg po at bedtime for muscle spasms.  She has been sleeping well at night recently and her fatigue has been stable overall.   She has difficulty exercising due to lower back pain.    History of insomnia: She takes Zanaflex 4 mg po at bedtime for muscle spasms and insomnia. A refill of zanaflex was sent to the pharmacy.   Primary osteoarthritis of both hands: She has mild PIP and DIP synovial thickening.  No tenderness or synovitis on exam.  She has complete fist formation bilaterally.  She has been having increased discomfort in the DIP joints.  Joint protection and muscle strengthening were discussed.  Hand exercises were provided to the patient.    Primary osteoarthritis of right hip: She has good ROM with no discomfort on exam today.   History of depression: Managed   History of migraine: She takes tramadol 50 mg every 6 hours as needed for migraines.  Other medical conditions are listed as follows:   History of hypertension  History of gastroesophageal reflux (GERD)  History of bowel resection  History of hypercholesterolemia  Orders: No orders of the defined types were placed in this encounter.  Meds ordered this encounter  Medications  . tiZANidine (ZANAFLEX) 4 MG tablet    Sig: Take 1 tablet (4 mg total) by mouth at bedtime as needed for muscle spasms.    Dispense:  30 tablet    Refill:  2       Follow-Up Instructions: Return in about 6 months (around 06/18/2019) for Fibromyalgia, Osteoarthritis.   Gearldine Bienenstock, PA-C  Note - This record has been created using Dragon software.  Chart creation errors have been sought, but may not always  have  been located. Such creation errors do not reflect on  the standard of medical care.

## 2018-12-19 ENCOUNTER — Ambulatory Visit: Payer: Managed Care, Other (non HMO) | Admitting: Physician Assistant

## 2018-12-19 ENCOUNTER — Encounter: Payer: Self-pay | Admitting: Physician Assistant

## 2018-12-19 ENCOUNTER — Other Ambulatory Visit: Payer: Self-pay

## 2018-12-19 VITALS — BP 138/84 | HR 79 | Resp 15 | Ht 66.0 in | Wt 259.6 lb

## 2018-12-19 DIAGNOSIS — Z9049 Acquired absence of other specified parts of digestive tract: Secondary | ICD-10-CM

## 2018-12-19 DIAGNOSIS — M1611 Unilateral primary osteoarthritis, right hip: Secondary | ICD-10-CM | POA: Diagnosis not present

## 2018-12-19 DIAGNOSIS — Z8679 Personal history of other diseases of the circulatory system: Secondary | ICD-10-CM

## 2018-12-19 DIAGNOSIS — Z8659 Personal history of other mental and behavioral disorders: Secondary | ICD-10-CM

## 2018-12-19 DIAGNOSIS — M797 Fibromyalgia: Secondary | ICD-10-CM | POA: Diagnosis not present

## 2018-12-19 DIAGNOSIS — M19041 Primary osteoarthritis, right hand: Secondary | ICD-10-CM

## 2018-12-19 DIAGNOSIS — Z87898 Personal history of other specified conditions: Secondary | ICD-10-CM

## 2018-12-19 DIAGNOSIS — M19042 Primary osteoarthritis, left hand: Secondary | ICD-10-CM

## 2018-12-19 DIAGNOSIS — Z8669 Personal history of other diseases of the nervous system and sense organs: Secondary | ICD-10-CM

## 2018-12-19 DIAGNOSIS — Z8719 Personal history of other diseases of the digestive system: Secondary | ICD-10-CM

## 2018-12-19 DIAGNOSIS — Z8639 Personal history of other endocrine, nutritional and metabolic disease: Secondary | ICD-10-CM

## 2018-12-19 MED ORDER — TIZANIDINE HCL 4 MG PO TABS: 4.0000 mg | ORAL_TABLET | Freq: Every evening | ORAL | 2 refills | Status: DC | PRN

## 2018-12-19 NOTE — Patient Instructions (Signed)
Hand Exercises °Hand exercises can be helpful for almost anyone. These exercises can strengthen the hands, improve flexibility and movement, and increase blood flow to the hands. These results can make work and daily tasks easier. Hand exercises can be especially helpful for people who have joint pain from arthritis or have nerve damage from overuse (carpal tunnel syndrome). These exercises can also help people who have injured a hand. °Exercises °Most of these hand exercises are gentle stretching and motion exercises. It is usually safe to do them often throughout the day. Warming up your hands before exercise may help to reduce stiffness. You can do this with gentle massage or by placing your hands in warm water for 10-15 minutes. °It is normal to feel some stretching, pulling, tightness, or mild discomfort as you begin new exercises. This will gradually improve. Stop an exercise right away if you feel sudden, severe pain or your pain gets worse. Ask your health care provider which exercises are best for you. °Knuckle bend or "claw" fist °1. Stand or sit with your arm, hand, and all five fingers pointed straight up. Make sure to keep your wrist straight during the exercise. °2. Gently bend your fingers down toward your palm until the tips of your fingers are touching the top of your palm. Keep your big knuckle straight and just bend the small knuckles in your fingers. °3. Hold this position for __________ seconds. °4. Straighten (extend) your fingers back to the starting position. °Repeat this exercise 5-10 times with each hand. °Full finger fist °1. Stand or sit with your arm, hand, and all five fingers pointed straight up. Make sure to keep your wrist straight during the exercise. °2. Gently bend your fingers into your palm until the tips of your fingers are touching the middle of your palm. °3. Hold this position for __________ seconds. °4. Extend your fingers back to the starting position, stretching every  joint fully. °Repeat this exercise 5-10 times with each hand. °Straight fist °1. Stand or sit with your arm, hand, and all five fingers pointed straight up. Make sure to keep your wrist straight during the exercise. °2. Gently bend your fingers at the big knuckle, where your fingers meet your hand, and the middle knuckle. Keep the knuckle at the tips of your fingers straight and try to touch the bottom of your palm. °3. Hold this position for __________ seconds. °4. Extend your fingers back to the starting position, stretching every joint fully. °Repeat this exercise 5-10 times with each hand. °Tabletop °1. Stand or sit with your arm, hand, and all five fingers pointed straight up. Make sure to keep your wrist straight during the exercise. °2. Gently bend your fingers at the big knuckle, where your fingers meet your hand, as far down as you can while keeping the small knuckles in your fingers straight. Think of forming a tabletop with your fingers. °3. Hold this position for __________ seconds. °4. Extend your fingers back to the starting position, stretching every joint fully. °Repeat this exercise 5-10 times with each hand. °Finger spread °1. Place your hand flat on a table with your palm facing down. Make sure your wrist stays straight as you do this exercise. °2. Spread your fingers and thumb apart from each other as far as you can until you feel a gentle stretch. Hold this position for __________ seconds. °3. Bring your fingers and thumb tight together again. Hold this position for __________ seconds. °Repeat this exercise 5-10 times with each hand. °  Making circles °1. Stand or sit with your arm, hand, and all five fingers pointed straight up. Make sure to keep your wrist straight during the exercise. °2. Make a circle by touching the tip of your thumb to the tip of your index finger. °3. Hold for __________ seconds. Then open your hand wide. °4. Repeat this motion with your thumb and each finger on your  hand. °Repeat this exercise 5-10 times with each hand. °Thumb motion °1. Sit with your forearm resting on a table and your wrist straight. Your thumb should be facing up toward the ceiling. Keep your fingers relaxed as you move your thumb. °2. Lift your thumb up as high as you can toward the ceiling. Hold for __________ seconds. °3. Bend your thumb across your palm as far as you can, reaching the tip of your thumb for the small finger (pinkie) side of your palm. Hold for __________ seconds. °Repeat this exercise 5-10 times with each hand. °Grip strengthening ° °1. Hold a stress ball or other soft ball in the middle of your hand. °2. Slowly increase the pressure, squeezing the ball as much as you can without causing pain. Think of bringing the tips of your fingers into the middle of your palm. All of your finger joints should bend when doing this exercise. °3. Hold your squeeze for __________ seconds, then relax. °Repeat this exercise 5-10 times with each hand. °Contact a health care provider if: °· Your hand pain or discomfort gets much worse when you do an exercise. °· Your hand pain or discomfort does not improve within 2 hours after you exercise. °If you have any of these problems, stop doing these exercises right away. Do not do them again unless your health care provider says that you can. °Get help right away if: °· You develop sudden, severe hand pain or swelling. If this happens, stop doing these exercises right away. Do not do them again unless your health care provider says that you can. °This information is not intended to replace advice given to you by your health care provider. Make sure you discuss any questions you have with your health care provider. °Document Released: 12/28/2014 Document Revised: 05/09/2018 Document Reviewed: 01/17/2018 °[employer] Patient Education © 2020 [employer] ° °

## 2019-03-24 ENCOUNTER — Other Ambulatory Visit: Payer: Self-pay | Admitting: Family Medicine

## 2019-03-24 DIAGNOSIS — Z1231 Encounter for screening mammogram for malignant neoplasm of breast: Secondary | ICD-10-CM

## 2019-04-01 ENCOUNTER — Other Ambulatory Visit: Payer: Self-pay

## 2019-04-01 ENCOUNTER — Ambulatory Visit
Admission: RE | Admit: 2019-04-01 | Discharge: 2019-04-01 | Disposition: A | Discharge status: Home / Self Care | Payer: Managed Care, Other (non HMO) | Source: Ambulatory Visit | Attending: Family Medicine | Admitting: Family Medicine

## 2019-04-01 DIAGNOSIS — Z1231 Encounter for screening mammogram for malignant neoplasm of breast: Secondary | ICD-10-CM

## 2019-06-10 NOTE — Progress Notes (Signed)
Office Visit Note  Patient: Charlotte Fox             Date of Birth: 30-Oct-1965           MRN: [id number]             PCP: Cari Caraway, MD Referring: Cari Caraway, MD Visit Date: 06/18/2019 Occupation: @GUAROCC @  Subjective:  Trapezius muscle tenderness   History of Present Illness: Charlotte Fox is a 54 y.o. female with history of fibromyalgia and osteoarthritis. She presents today with trapezius muscle tension and tenderness. She takes zanaflex 4 mg po at bedtime for muscle spasms.  She experiences left lateral epicondylitis intermittently. She has ongoing trochanteric bursitis bilaterally.  She has chronic lower back pain and stiffness. She denies any symptoms of radiculopathy. She takes tramadol as needed for pain relief.    Activities of Daily Living:  Patient reports morning stiffness for 15 minutes.   Patient Reports nocturnal pain.  Difficulty dressing/grooming: Denies Difficulty climbing stairs: Denies Difficulty getting out of chair: Denies Difficulty using hands for taps, buttons, cutlery, and/or writing: Reports  Review of Systems  Constitutional: Positive for fatigue.  HENT: Negative for mouth sores, mouth dryness and nose dryness.   Eyes: Positive for itching and dryness. Negative for pain and visual disturbance.  Respiratory: Positive for shortness of breath. Negative for cough, hemoptysis and difficulty breathing.        Due to asthma   Cardiovascular: Negative for chest pain, palpitations, hypertension and swelling in legs/feet.  Gastrointestinal: Negative for blood in stool, constipation and diarrhea.  Endocrine: Negative for increased urination.  Genitourinary: Negative for difficulty urinating and painful urination.  Musculoskeletal: Positive for arthralgias, joint pain, myalgias, morning stiffness, muscle tenderness and myalgias. Negative for joint swelling and muscle weakness.  Skin: Negative for color change, pallor, rash, hair loss,  nodules/bumps, redness, skin tightness, ulcers and sensitivity to sunlight.  Allergic/Immunologic: Negative for susceptible to infections.  Neurological: Positive for headaches. Negative for dizziness and memory loss.  Hematological: Negative for swollen glands.  Psychiatric/Behavioral: Negative for depressed mood, confusion and sleep disturbance. The patient is not nervous/anxious.     PMFS History:  Patient Active Problem List   Diagnosis Date Noted  . Travel advice encounter 03/23/2017  . Class 3 severe obesity due to excess calories without serious comorbidity with body mass index (BMI) of 40.0 to 44.9 in adult ([place]) 12/13/2016  . History of bowel resection 12/13/2016  . History of hypertension 12/13/2016  . History of depression 12/13/2016  . History of ankle surgery 12/12/2016  . Fatigue 12/10/2015  . Fibromyalgia 12/09/2015  . Osteoarthritis of both hands 12/09/2015  . Osteoarthritis of right hip 12/09/2015  . Hypertension 12/09/2015  . Elevated cholesterol 12/09/2015  . Migraines 12/09/2015  . Insomnia 12/09/2015  . History of endometriosis 12/09/2015  . Ventral hernia without obstruction or gangrene 09/01/2014  . Mood disorder ([place]) 10/21/2013  . GERD (gastroesophageal reflux disease) 10/21/2013  . Nausea with vomiting 10/21/2013  . Hypokalemia 10/21/2013  . Cellulitis of right leg 10/20/2013  . Cellulitis of right foot 10/20/2013    Past Medical History:  Diagnosis Date  . Acid reflux   . Anemia   . Arthritis   . Asthma   . Complication of anesthesia    propofol dropped resp during colonoscopy, woke up during procedure  . Depression   . Elevated cholesterol 12/09/2015  . Fibromyalgia   . Headache    hx migraines  . Heart murmur  does not give her any problems  . High cholesterol   . History of endometriosis 12/09/2015  . History of hiatal hernia   . Hypertension   . Insomnia 12/09/2015  . Migraines 12/09/2015  . Osteoarthritis of both hands 12/09/2015  .  Osteoarthritis of right hip 12/09/2015   Mild  . Patent foramen ovale    no longer sees cardiology  . Retinal artery occlusion   . Stroke [facility])    retinal artery   several yrs ago,  left eye diminished vision     Family History  Problem Relation Age of Onset  . Hypertension Mother   . Diabetes Mother   . Arthritis Mother   . Hypertension Brother    Past Surgical History:  Procedure Laterality Date  . ABDOMINAL HYSTERECTOMY     appendectomy/ colon  . ANKLE SURGERY     at age 35  . APPENDECTOMY    . CHOLECYSTECTOMY N/A 05/21/2014   Procedure: LAPAROSCOPIC CHOLECYSTECTOMY WITH INTRAOPERATIVE CHOLANGIOGRAM;  Surgeon: Manus Rudd, MD;  Location: [facility] OR;  Service: General;  Laterality: N/A;  . COLON SURGERY     removed 5 in colon during hysterectmy [place] due to mass  . COLONOSCOPY     x 2  . KNEE ARTHROPLASTY    . KNEE SURGERY    . TOE SURGERY    . VENTRAL HERNIA REPAIR  09/01/2014   with mesh  . VENTRAL HERNIA REPAIR N/A 09/01/2014   Procedure: LAPAROSCOPIC VENTRAL HERNIA REPAIR WITH MESH;  Surgeon: Manus Rudd, MD;  Location: [facility] OR;  Service: General;  Laterality: N/A;   Social History   Social History Narrative  . Not on file   Immunization History  Administered Date(s) Administered  . Hepatitis A, Adult 03/23/2017  . Hepatitis B 06/05/1997, 07/17/1997  . Pneumococcal Polysaccharide-23 01/10/2016  . Td 05/30/2001  . Tdap 01/06/2009  . Typhoid Inactivated 03/23/2017     Objective: Vital Signs: BP 138/82 (BP Location: Left Arm, Patient Position: Sitting, Cuff Size: Normal)   Pulse 83   Resp 15   Ht 5\' 6"  (1.676 m)   Wt 262 lb 12.8 oz (119.2 kg)   LMP 12/31/2011   BMI 42.42 kg/m    Physical Exam Vitals and nursing note reviewed.  Constitutional:      Appearance: She is well-developed.  HENT:     Head: Normocephalic and atraumatic.  Eyes:     Conjunctiva/sclera: Conjunctivae normal.  Pulmonary:     Effort: Pulmonary effort is normal.  Abdominal:      General: Bowel sounds are normal.     Palpations: Abdomen is soft.  Musculoskeletal:     Cervical back: Normal range of motion.  Lymphadenopathy:     Cervical: No cervical adenopathy.  Skin:    General: Skin is warm and dry.     Capillary Refill: Capillary refill takes less than 2 seconds.  Neurological:     Mental Status: She is alert and oriented to person, place, and time.  Psychiatric:        Behavior: Behavior normal.    Musculoskeletal Exam: C-spine, thoracic spine, and lumbar spine good ROM. Trapezius muscle tension and tenderness bilaterally.  Shoulder joints, elbow joints, wrist joints, MCPs, PIPs, and DIPs good ROM with no synovitis.  Complete fist formation bilaterally.  Hip joints, knee joints, ankle joints good ROM with no synovitis.  No warmth or effusion of knee joints. No tenderness or swelling of ankle joints. Tenderness over trochanteric bursa bilaterally.  CDAI Exam: CDAI Score: -- Patient Global: --; Provider Global: -- Swollen: --; Tender: -- Joint Exam 06/18/2019   No joint exam has been documented for this visit   There is currently no information documented on the homunculus. Go to the Rheumatology activity and complete the homunculus joint exam.  Investigation: No additional findings.  Imaging: No results found.  Recent Labs: Lab Results  Component Value Date   WBC 8.4 06/09/2016   HGB 12.9 06/09/2016   PLT 320 06/09/2016   NA 141 06/09/2016   K 4.4 06/09/2016   CL 101 06/09/2016   CO2 25 06/09/2016   GLUCOSE 88 06/09/2016   BUN 14 06/09/2016   CREATININE 0.88 06/09/2016   BILITOT 0.4 06/09/2016   ALKPHOS 75 06/09/2016   AST 16 06/09/2016   ALT 14 06/09/2016   PROT 6.9 06/09/2016   ALBUMIN 4.3 06/09/2016   CALCIUM 9.3 06/09/2016   GFRAA 89 06/09/2016    Speciality Comments: No specialty comments available.  Procedures:  Trigger Point Inj  Date/Time: 06/18/2019 8:16 AM Performed by: Pollyann Savoy, MD Authorized by:  Pollyann Savoy, MD   Consent Given by:  Patient Site marked: the procedure site was marked   Timeout: prior to procedure the correct patient, procedure, and site was verified   Indications:  Pain Total # of Trigger Points:  2 Location: neck   Needle Size:  27 G Approach:  Dorsal Medications #1:  0.5 mL lidocaine 1 %; 10 mg triamcinolone acetonide 40 MG/ML Medications #2:  0.5 mL lidocaine 1 %; 10 mg triamcinolone acetonide 40 MG/ML Patient tolerance:  Patient tolerated the procedure well with no immediate complications   Allergies: Erythromycin, Propofol, Sulfa antibiotics, Levaquin [levofloxacin], Allegra-d [fexofenadine-pseudoephed er], Azithromycin, Penicillins, and Vicodin [hydrocodone-acetaminophen]   Assessment / Plan:     Visit Diagnoses: Fibromyalgia: She has generalized hyperalgesia and positive tender points on exam.  She presents today with increased discomfort due to trapezius muscle spasms, bilateral trochanteric bursitis, and left lateral epicondylitis. She has generalized myalgias and muscle tenderness.  She requested bilateral trigger point injections.  Procedure note completed above.  She continues to take robaxin 500 mg 1 to 2 tablets daily as needed and zanaflex 4 mg po at bedtime for muscle spasms. Refills of zanaflex and robaxin were sent to the pharmacy.  She was encouraged to stretch and exercise on a regular basis.   She will follow up in 6 months.   Trapezius muscle spasm: She presents today with trapezius muscle tension and tenderness bilaterally.  She has been experiencing muscle spasms intermittently.  She takes robaxin 500 mg 1 to 2 tablets daily as needed for muscle spasms and zanaflex 4 mg po at bedtime.  She requested trigger point injections today.  She tolerated the procedure well.  The procedure note was completed above.    Lateral epicondylitis, left elbow: She has tenderness to palpation on exam today.  She was given a handout of exercises to perform.    Trochanteric bursitis of both hips: She has tenderness to palpation over bilateral trochanteric bursa.  She was encouraged to perform stretching exercises daily.    Primary osteoarthritis of both hands: She has no tenderness or inflammation on exam.  She has complete fist formation bilaterally.  Joint protection and muscle strengthening were discussed.   Primary osteoarthritis of right hip: She has good ROM with no discomfort. She has tenderness to palpation over the right trochanteric bursa.  History of insomnia: She has been sleeping well at night.  Other medical conditions are listed as follows:   History of depression  History of migraine  History of hypertension  History of gastroesophageal reflux (GERD)  History of bowel resection  History of hypercholesterolemia  Orders: Orders Placed This Encounter  Procedures  . Trigger Point Inj   Meds ordered this encounter  Medications  . tiZANidine (ZANAFLEX) 4 MG tablet    Sig: Take 1 tablet (4 mg total) by mouth at bedtime as needed for muscle spasms.    Dispense:  30 tablet    Refill:  2  . methocarbamol (ROBAXIN) 500 MG tablet    Sig: Take 1 tablet (500 mg total) by mouth 2 (two) times daily as needed.    Dispense:  60 tablet    Refill:  1    Face-to-face time spent with patient was 30 minutes. Greater than 50% of time was spent in counseling and coordination of care.  Follow-Up Instructions: Return in about 6 months (around 12/19/2019) for Fibromyalgia, Osteoarthritis.   Pollyann Savoy, MD   Scribed by-  Sherron Ales, PA-C  Note - This record has been created using Dragon software.  Chart creation errors have been sought, but may not always  have been located. Such creation errors do not reflect on  the standard of medical care.

## 2019-06-18 ENCOUNTER — Ambulatory Visit: Payer: Managed Care, Other (non HMO) | Admitting: Rheumatology

## 2019-06-18 ENCOUNTER — Other Ambulatory Visit: Payer: Self-pay

## 2019-06-18 ENCOUNTER — Encounter: Payer: Self-pay | Admitting: Physician Assistant

## 2019-06-18 VITALS — BP 138/82 | HR 83 | Resp 15 | Ht 66.0 in | Wt 262.8 lb

## 2019-06-18 DIAGNOSIS — M797 Fibromyalgia: Secondary | ICD-10-CM

## 2019-06-18 DIAGNOSIS — Z87898 Personal history of other specified conditions: Secondary | ICD-10-CM

## 2019-06-18 DIAGNOSIS — Z8669 Personal history of other diseases of the nervous system and sense organs: Secondary | ICD-10-CM

## 2019-06-18 DIAGNOSIS — M1611 Unilateral primary osteoarthritis, right hip: Secondary | ICD-10-CM

## 2019-06-18 DIAGNOSIS — M7712 Lateral epicondylitis, left elbow: Secondary | ICD-10-CM | POA: Diagnosis not present

## 2019-06-18 DIAGNOSIS — M62838 Other muscle spasm: Secondary | ICD-10-CM

## 2019-06-18 DIAGNOSIS — Z9049 Acquired absence of other specified parts of digestive tract: Secondary | ICD-10-CM

## 2019-06-18 DIAGNOSIS — Z8659 Personal history of other mental and behavioral disorders: Secondary | ICD-10-CM

## 2019-06-18 DIAGNOSIS — M7061 Trochanteric bursitis, right hip: Secondary | ICD-10-CM

## 2019-06-18 DIAGNOSIS — M19042 Primary osteoarthritis, left hand: Secondary | ICD-10-CM

## 2019-06-18 DIAGNOSIS — Z8719 Personal history of other diseases of the digestive system: Secondary | ICD-10-CM

## 2019-06-18 DIAGNOSIS — M7062 Trochanteric bursitis, left hip: Secondary | ICD-10-CM

## 2019-06-18 DIAGNOSIS — M19041 Primary osteoarthritis, right hand: Secondary | ICD-10-CM

## 2019-06-18 DIAGNOSIS — Z8639 Personal history of other endocrine, nutritional and metabolic disease: Secondary | ICD-10-CM

## 2019-06-18 DIAGNOSIS — Z8679 Personal history of other diseases of the circulatory system: Secondary | ICD-10-CM

## 2019-06-18 MED ORDER — TRIAMCINOLONE ACETONIDE 40 MG/ML IJ SUSP
10.0000 mg | INTRAMUSCULAR | Status: AC | PRN
  Administered 2019-06-18: 10 mg via INTRAMUSCULAR

## 2019-06-18 MED ORDER — LIDOCAINE HCL 1 % IJ SOLN
0.5000 mL | INTRAMUSCULAR | Status: AC | PRN
  Administered 2019-06-18: .5 mL

## 2019-06-18 MED ORDER — METHOCARBAMOL 500 MG PO TABS: 500.0000 mg | ORAL_TABLET | Freq: Two times a day (BID) | ORAL | 1 refills | Status: DC | PRN

## 2019-06-18 MED ORDER — TIZANIDINE HCL 4 MG PO TABS: 4.0000 mg | ORAL_TABLET | Freq: Every evening | ORAL | 2 refills | Status: DC | PRN

## 2019-06-18 NOTE — Patient Instructions (Signed)
[facility] °Ask your health care provider which exercises are safe for you. Do exercises exactly as told by your health care provider and adjust them as directed. It is normal to feel mild stretching, pulling, tightness, or discomfort as you do these exercises. Stop right away if you feel sudden pain or your pain gets worse. Do not begin these exercises until told by your health care provider. °Stretching and range-of-motion exercises °These exercises warm up your muscles and joints and improve the movement and flexibility of your elbow. These exercises also help to relieve pain, numbness, and tingling. °Wrist flexion, assisted ° °1. Straighten your left / right elbow in front of you with your palm facing down toward the floor. °? If told by your health care provider, bend your left / right elbow to a 90-degree angle (right angle) at your side. °2. With your other hand, gently push over the back of your left / right hand so your fingers point toward the floor (flexion). Stop when you feel a gentle stretch on the back of your forearm. °3. Hold this position for __________ seconds. °Repeat __________ times. Complete this exercise __________ times a day. °Wrist extension, assisted ° °1. Straighten your left / right elbow in front of you with your palm facing up toward the ceiling. °? If told by your health care provider, bend your left / right elbow to a 90-degree angle (right angle) at your side. °2. With your other hand, gently pull your left / right hand and fingers toward the floor (extension). Stop when you feel a gentle stretch on the palm side of your forearm. °3. Hold this position for __________ seconds. °Repeat __________ times. Complete this exercise __________ times a day. °Assisted forearm rotation, supination °1. Sit or stand with your left / right elbow bent to a 90-degree angle (right angle) at your side. °2. Using your uninjured hand, turn (rotate) your left / right palm up toward the ceiling  (supination) until you feel a gentle stretch along the inside of your forearm. °3. Hold this position for __________ seconds. °Repeat __________ times. Complete this exercise __________ times a day. °Assisted forearm rotation, pronation °1. Sit or stand with your left / right elbow bent to a 90-degree angle (right angle) at your side. °2. Using your uninjured hand, rotate your left / right palm down toward the floor (pronation) until you feel a gentle stretch along the outside of your forearm. °3. Hold this position for __________ seconds. °Repeat __________ times. Complete this exercise __________ times a day. °Strengthening exercises °These exercises build strength and endurance in your forearm and elbow. Endurance is the ability to use your muscles for a long time, even after they get tired. °Radial deviation ° °1. Stand with a __________ weight or a hammer in your left / right hand. Or, sit while holding a rubber exercise band or tubing, with your left / right forearm supported on a table or countertop. °? If you are standing, position your forearm so that your thumb is facing forward. If you are sitting, position your forearm so that the thumb is facing the ceiling. This is the neutral position. °2. Raise your hand upward in front of you so your thumb moves toward the ceiling (radial deviation), or pull up on the rubber tubing. Keep your forearm and elbow still while you move your wrist only. °3. Hold this position for __________ seconds. °4. Slowly return to the starting position. °Repeat __________ times. Complete this exercise __________ times   a day. °Wrist extension, eccentric °1. Sit with your left / right forearm palm-down and supported on a table or other surface. Let your left / right wrist extend over the edge of the surface. °2. Hold a __________ weight or a piece of exercise band or tubing in your left / right hand. °? If using a rubber exercise band or tubing, hold the other end of the tubing with  your other hand. °3. Use your uninjured hand to move your left / right hand up toward the ceiling. °4. Take your uninjured hand away and slowly return to the starting position using only your left / right hand. Lowering your arm under tension is called eccentric extension. °Repeat __________ times. Complete this exercise __________ times a day. °Wrist extension °Do not do this exercise if it causes pain at the outside of your elbow. Only do this exercise once instructed by your health care provider. °1. Sit with your left / right forearm supported on a table or other surface and your palm turned down toward the floor. Let your left / right wrist extend over the edge of the surface. °2. Hold a __________ weight or a piece of rubber exercise band or tubing. °? If you are using a rubber exercise band or tubing, hold the band or tubing in place with your other hand to provide resistance. °3. Slowly bend your wrist so your hand moves up toward the ceiling (extension). Move only your wrist, keeping your forearm and elbow still. °4. Hold this position for __________ seconds. °5. Slowly return to the starting position. °Repeat __________ times. Complete this exercise __________ times a day. °Forearm rotation, supination °To do this exercise, you will need a lightweight hammer or rubber mallet. °1. Sit with your left / right forearm supported on a table or other surface. Bend your elbow to a 90-degree angle (right angle). Position your forearm so that your palm is facing down toward the floor, with your hand resting over the edge of the table. °2. Hold a hammer in your left / right hand. °? To make this exercise easier, hold the hammer near the head of the hammer. °? To make this exercise harder, hold the hammer near the end of the handle. °3. Without moving your wrist or elbow, slowly rotate your forearm so your palm faces up toward the ceiling (supination). °4. Hold this position for __________ seconds. °5. Slowly return  to the starting position. °Repeat __________ times. Complete this exercise __________ times a day. °Shoulder blade squeeze °1. Sit in a stable chair or stand with good posture. If you are sitting down, do not let your back touch the back of the chair. °2. Your arms should be at your sides with your elbows bent to a 90-degree angle (right angle). Position your forearms so that your thumbs are facing the ceiling (neutral position). °3. Without lifting your shoulders up, squeeze your shoulder blades tightly together. °4. Hold this position for __________ seconds. °5. Slowly release and return to the starting position. °Repeat __________ times. Complete this exercise __________ times a day. °This information is not intended to replace advice given to you by your health care provider. Make sure you discuss any questions you have with your health care provider. °Document Revised: 05/09/2018 Document Reviewed: 03/12/2018 °Elsevier Patient Education © 2020 [employer] ° °

## 2019-07-08 ENCOUNTER — Telehealth: Payer: Self-pay | Admitting: *Deleted

## 2019-07-08 NOTE — Telephone Encounter (Signed)
Received an alert from [facility] regarding Methocarbamol and Zanaflex. Potential Clinical Concern: Oipiod Risk Management: Overlapping days supply of muscle relaxants  Reviewed by Sherron Ales, PA-C Recommendation: Please advise patient to only take Robaxin and Zanaflex as needed and not on the same days. Please explain the risks of dependence.  Attempted to contact the patient and left message for patient to call the office.

## 2019-11-27 ENCOUNTER — Other Ambulatory Visit: Payer: Self-pay

## 2019-11-27 ENCOUNTER — Encounter: Payer: Managed Care, Other (non HMO) | Attending: Family Medicine | Admitting: Registered"

## 2019-11-27 ENCOUNTER — Encounter: Payer: Self-pay | Admitting: Registered"

## 2019-11-27 DIAGNOSIS — Z713 Dietary counseling and surveillance: Secondary | ICD-10-CM | POA: Diagnosis present

## 2019-11-27 NOTE — Progress Notes (Signed)
  Medical Nutrition Therapy:  Appt start time: 3:57 end time:  4:46.   Assessment:  Primary concerns today: Per referral, most recent A1c was 6.1. Pt states a month before A1c was 5.9. States she has a mother who has diabetes therefore familiar with normal A1c ranges. States she is here because her PCP sent her. States she nor her mom wants to cook.   States she is a picky eater. States she only likes certain vegetables.   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 Reports she does not like to sweat. Reports history of fibromyalgia and arthritis. States she is anti-social and likes it that way.   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 Lives with mom.   Pt expectations: none expected   Preferred Learning Style:   No preference indicated   Learning Readiness:   Ready  Change in progress   MEDICATIONS: See list   DIETARY INTAKE:  Usual eating pattern includes 3 meals and 0 snacks per day.  Everyday foods include green beans, pea, lettuce, cucumbers, .  Avoided foods include seafood, broccoli, cauliflower, [place] food, food with strong spices, mushrooms, veal, duck, rabbits, most green items, raw tomatoes.    24-hr recall:  B (8:30 AM): [place] - egg biscuit + diet Pepsi  Snk ( AM):   L ( PM): [employer]-spicy chicken sandwich (plain) + small fries + lg diet Root Beer + small chocolate frosty Snk ( PM):  D ( PM): [employer]-1/2 rigatoni + cheese + marinara + diet Root Beer Snk ( PM):  Beverages: diet soda, V8 splash (sometimes)  Usual physical activity: none reported; lifting grocery bags at least once a week  Estimated energy needs: 1600 calories 180 g carbohydrates 120 g protein 44 g fat  Progress Towards Goal(s):  In progress.   Nutritional Diagnosis:  NB-1.1 Food and nutrition-related knowledge deficit As related to prediabetes.  As evidenced by verbalized incomplete knowledge.    Intervention:  Pt was educated and counseled on  metabolism, importance of not skipping meals, effects of dieting/restriction, how carbohydrates work in the body, A1c ranges for prediabetes/diabetes, role of fiber in eating regimen, and importance of physical activity. Discussed meal/snack planning and how to balance meals/snacks using MyPlate for prediabetes. Pt was in agreement with goals listed. Goals: - Aim to increase non-starchy vegetable intake with frozen vegetables for convenience.  - Aim to drink at least 1-2 bottles of water a day.  - Aim to increase physical activity to at least 15-20 walking at least 2 days/week.   Teaching Method Utilized:  Visual Auditory Hands on  Handouts given during visit include:  My Plate for Prediabetes  Barriers to learning/adherence to lifestyle change: contemplative stage of change  Demonstrated degree of understanding via:  Teach Back   Monitoring/Evaluation:  Dietary intake, exercise, and body weight prn.

## 2019-11-27 NOTE — Patient Instructions (Signed)
-   Aim to increase non-starchy vegetable intake with frozen vegetables for convenience.   - Aim to drink at least 1-2 bottles of water a day.   - Aim to increase physical activity to at least 15-20 walking at least 2 days/week.

## 2019-12-04 NOTE — Progress Notes (Signed)
Office Visit Note  Patient: Charlotte Fox             Date of Birth: 07/12/1965           MRN: [id number]             PCP: Gweneth Dimitri, MD Referring: Gweneth Dimitri, MD Visit Date: 12/17/2019 Occupation: @GUAROCC @  Subjective:  Trochanteric bursitis of both hips   History of Present Illness: Charlotte Fox is a 54 y.o. female with history of fibromyalgia and osteoarthritis.  She has occasional myalgias and muscle tenderness due to fibromyalgia.  Her last flare was about 1 month ago.  She continues to have chronic fatigue which has been stable overall.  She experiences occasional nocturnal pain due to trochanteric bursitis of both hips.  She uses a heated mattress pad, which alleviates her stiffness and generalized pain.  She states she has intermittent pain in both hands.  She reports about 2 weeks ago she developed pain and swelling in the left index finger which resolved after 1 day with the use of voltaren gel.  She states the discomfort was likely exacerbated by carrying luggage while on vacation.    Activities of Daily Living:  Patient reports morning stiffness for 30 minutes.   Patient Reports nocturnal pain.  Difficulty dressing/grooming: Denies Difficulty climbing stairs: Denies Difficulty getting out of chair: Denies Difficulty using hands for taps, buttons, cutlery, and/or writing: Reports  Review of Systems  Constitutional: Positive for fatigue.  HENT: Negative for mouth sores, mouth dryness and nose dryness.   Eyes: Positive for dryness. Negative for pain and itching.  Respiratory: Negative for shortness of breath and difficulty breathing.   Cardiovascular: Negative for chest pain and palpitations.  Gastrointestinal: Negative for blood in stool, constipation and diarrhea.  Endocrine: Negative for increased urination.  Genitourinary: Negative for difficulty urinating.  Musculoskeletal: Positive for arthralgias, joint pain, joint swelling, myalgias, morning  stiffness, muscle tenderness and myalgias.  Skin: Negative for color change, rash and redness.  Allergic/Immunologic: Negative for susceptible to infections.  Neurological: Positive for numbness and headaches. Negative for dizziness, memory loss and weakness.  Hematological: Negative for bruising/bleeding tendency.  Psychiatric/Behavioral: Negative for confusion.    PMFS History:  Patient Active Problem List   Diagnosis Date Noted  . Travel advice encounter 03/23/2017  . Class 3 severe obesity due to excess calories without serious comorbidity with body mass index (BMI) of 40.0 to 44.9 in adult (HCC) 12/13/2016  . History of bowel resection 12/13/2016  . History of hypertension 12/13/2016  . History of depression 12/13/2016  . History of ankle surgery 12/12/2016  . Fatigue 12/10/2015  . Fibromyalgia 12/09/2015  . Osteoarthritis of both hands 12/09/2015  . Osteoarthritis of right hip 12/09/2015  . Hypertension 12/09/2015  . Elevated cholesterol 12/09/2015  . Migraines 12/09/2015  . Insomnia 12/09/2015  . History of endometriosis 12/09/2015  . Ventral hernia without obstruction or gangrene 09/01/2014  . Mood disorder (HCC) 10/21/2013  . GERD (gastroesophageal reflux disease) 10/21/2013  . Nausea with vomiting 10/21/2013  . Hypokalemia 10/21/2013  . Cellulitis of right leg 10/20/2013  . Cellulitis of right foot 10/20/2013    Past Medical History:  Diagnosis Date  . Acid reflux   . Anemia   . Arthritis   . Asthma   . Complication of anesthesia    propofol dropped resp during colonoscopy, woke up during procedure  . Depression   . Elevated cholesterol 12/09/2015  . Fibromyalgia   . Headache  hx migraines  . Heart murmur    does not give her any problems  . High cholesterol   . History of endometriosis 12/09/2015  . History of hiatal hernia   . Hypertension   . Insomnia 12/09/2015  . Migraines 12/09/2015  . Osteoarthritis of both hands 12/09/2015  . Osteoarthritis of  right hip 12/09/2015   Mild  . Patent foramen ovale    no longer sees cardiology  . Retinal artery occlusion   . Stroke [facility](HCC)    retinal artery   several yrs ago,  left eye diminished vision     Family History  Problem Relation Age of Onset  . Hypertension Mother   . Diabetes Mother   . Arthritis Mother   . Hypertension Brother   . Cancer Other   . Heart disease Other    Past Surgical History:  Procedure Laterality Date  . ABDOMINAL HYSTERECTOMY     appendectomy/ colon  . ANKLE SURGERY     at age 54  . APPENDECTOMY    . CHOLECYSTECTOMY N/A 05/21/2014   Procedure: LAPAROSCOPIC CHOLECYSTECTOMY WITH INTRAOPERATIVE CHOLANGIOGRAM;  Surgeon: Manus RuddMatthew Tsuei, MD;  Location: [facility] OR;  Service: General;  Laterality: N/A;  . COLON SURGERY     removed 5 in colon during hysterectmy [place] due to mass  . COLONOSCOPY     x 2  . KNEE ARTHROPLASTY    . KNEE SURGERY    . TOE SURGERY    . VENTRAL HERNIA REPAIR  09/01/2014   with mesh  . VENTRAL HERNIA REPAIR N/A 09/01/2014   Procedure: LAPAROSCOPIC VENTRAL HERNIA REPAIR WITH MESH;  Surgeon: Manus RuddMatthew Tsuei, MD;  Location: [facility] OR;  Service: General;  Laterality: N/A;   Social History   Social History Narrative  . Not on file   Immunization History  Administered Date(s) Administered  . Hepatitis A, Adult 03/23/2017  . Hepatitis B 06/05/1997, 07/17/1997  . PFIZER SARS-COV-2 Vaccination 04/05/2019, 04/26/2019, 11/21/2019  . Pneumococcal Polysaccharide-23 01/10/2016  . Td 05/30/2001  . Tdap 01/06/2009  . Typhoid Inactivated 03/23/2017     Objective: Vital Signs: BP 139/84 (BP Location: Left Arm, Patient Position: Sitting, Cuff Size: Large)   Pulse 82   Resp 16   Ht 5\' 6"  (1.676 m)   Wt 264 lb 3.2 oz (119.8 kg)   LMP 12/31/2011   BMI 42.64 kg/m    Physical Exam Vitals and nursing note reviewed.  Constitutional:      Appearance: She is well-developed.  HENT:     Head: Normocephalic and atraumatic.  Eyes:      Conjunctiva/sclera: Conjunctivae normal.  Pulmonary:     Effort: Pulmonary effort is normal.  Abdominal:     Palpations: Abdomen is soft.  Musculoskeletal:     Cervical back: Normal range of motion.  Skin:    General: Skin is warm and dry.     Capillary Refill: Capillary refill takes less than 2 seconds.  Neurological:     Mental Status: She is alert and oriented to person, place, and time.  Psychiatric:        Behavior: Behavior normal.      Musculoskeletal Exam: Generalized hyperalgesia and positive tender points. C-spine, thoracic spine, and lumbar spine good ROM.  Shoulder joints, elbow joints, wrist joints, MCPs, PIPs, and DIPs good ROM with no synovitis.  Complete fist formation bilaterally.  Hip joints, knee joints, and ankle joints good ROM with no discomfort.  No warmth or effusion of knee joints.  No tenderness  or swelling. Tenderness over bilateral trochanteric bursa.   CDAI Exam: CDAI Score: -- Patient Global: --; Provider Global: -- Swollen: --; Tender: -- Joint Exam 12/17/2019   No joint exam has been documented for this visit   There is currently no information documented on the homunculus. Go to the Rheumatology activity and complete the homunculus joint exam.  Investigation: No additional findings.  Imaging: No results found.  Recent Labs: Lab Results  Component Value Date   WBC 8.4 06/09/2016   HGB 12.9 06/09/2016   PLT 320 06/09/2016   NA 141 06/09/2016   K 4.4 06/09/2016   CL 101 06/09/2016   CO2 25 06/09/2016   GLUCOSE 88 06/09/2016   BUN 14 06/09/2016   CREATININE 0.88 06/09/2016   BILITOT 0.4 06/09/2016   ALKPHOS 75 06/09/2016   AST 16 06/09/2016   ALT 14 06/09/2016   PROT 6.9 06/09/2016   ALBUMIN 4.3 06/09/2016   CALCIUM 9.3 06/09/2016   GFRAA 89 06/09/2016    Speciality Comments: No specialty comments available.  Procedures:  No procedures performed Allergies: Erythromycin, Propofol, Sulfa antibiotics, Levaquin [levofloxacin],  Allegra-d [fexofenadine-pseudoephed er], Azithromycin, Penicillins, and Vicodin [hydrocodone-acetaminophen]   Assessment / Plan:     Visit Diagnoses: Fibromyalgia: She has generalized hyperalgesia and positive tender points on exam.  She experiences occasional myalgias and muscle tenderness due to underlying fibromyalgia.  Her last flare was about 1 month ago.  Her level of fatigue has been stable overall.  She continues to experience occasional nocturnal pain due to trochanteric bursitis of both hips.  She was encouraged to perform stretching exercises on a daily basis.  She was given a handout of these exercises to perform.  She continues to take Robaxin 500 mg 1-2 tablets by mouth as needed for muscle spasms and tizanidine 4 mg a mouth at bedtime as needed.  She requested a refill of tizanidine to be sent to the pharmacy.  We discussed the importance of regular exercise and good sleep hygiene.  She will follow-up in the office in 6 months.  Trapezius muscle spasm - She has trapezius muscle tension and tenderness bilaterally. She takes robaxin 500 mg 1 to 2 tablets daily as needed for muscle spasms and zanaflex 4 mg po at bedtime as needed.  Discussed the importance of performing neck exercises, massage, and using a heating pad.   Lateral epicondylitis, left elbow: She has intermittent discomfort typically   Trochanteric bursitis of both hips: She has tenderness ovation over bilateral trochanter bursa.  She was encouraged for injection exercises on a daily basis.  She was given a handout of exercises to perform.  Primary osteoarthritis of both hands: She has PIP and DIP thickening consistent with osteoarthritis of both hands.  No joint tenderness or inflammation was noted on exam.  She is able to make a complete fist bilaterally.  About 2 weeks ago she experienced pain and swelling in her right index finger after carrying luggage while on vacation.  She is Voltaren gel topically and her discomfort  resolved within about 1 day.  Joint protection and muscle strengthening were discussed.  She was given a handout of hand exercises to perform.  Primary osteoarthritis of right hip: She has good ROM with no discomfort on exam.  No groin pain at this time.   History of insomnia: She has intermittent nocturnal pain.  Discussed the importance of good sleep hygiene.    Other medical conditions are listed as follows:   History of depression  History  of hypertension  History of migraine  History of gastroesophageal reflux (GERD)  History of hypercholesterolemia  History of bowel resection  Orders: No orders of the defined types were placed in this encounter.  Meds ordered this encounter  Medications  . tiZANidine (ZANAFLEX) 4 MG tablet    Sig: Take 1 tablet (4 mg total) by mouth at bedtime as needed for muscle spasms.    Dispense:  90 tablet    Refill:  0     Follow-Up Instructions: Return in about 6 months (around 06/15/2020) for Fibromyalgia, Osteoarthritis.   Gearldine Bienenstock, PA-C  Note - This record has been created using Dragon software.  Chart creation errors have been sought, but may not always  have been located. Such creation errors do not reflect on  the standard of medical care.

## 2019-12-17 ENCOUNTER — Other Ambulatory Visit: Payer: Self-pay

## 2019-12-17 ENCOUNTER — Ambulatory Visit: Payer: Managed Care, Other (non HMO) | Admitting: Physician Assistant

## 2019-12-17 ENCOUNTER — Encounter: Payer: Self-pay | Admitting: Physician Assistant

## 2019-12-17 VITALS — BP 139/84 | HR 82 | Resp 16 | Ht 66.0 in | Wt 264.2 lb

## 2019-12-17 DIAGNOSIS — M19041 Primary osteoarthritis, right hand: Secondary | ICD-10-CM

## 2019-12-17 DIAGNOSIS — M19042 Primary osteoarthritis, left hand: Secondary | ICD-10-CM

## 2019-12-17 DIAGNOSIS — Z87898 Personal history of other specified conditions: Secondary | ICD-10-CM

## 2019-12-17 DIAGNOSIS — M797 Fibromyalgia: Secondary | ICD-10-CM

## 2019-12-17 DIAGNOSIS — M7061 Trochanteric bursitis, right hip: Secondary | ICD-10-CM

## 2019-12-17 DIAGNOSIS — M7712 Lateral epicondylitis, left elbow: Secondary | ICD-10-CM

## 2019-12-17 DIAGNOSIS — Z8669 Personal history of other diseases of the nervous system and sense organs: Secondary | ICD-10-CM

## 2019-12-17 DIAGNOSIS — Z8679 Personal history of other diseases of the circulatory system: Secondary | ICD-10-CM

## 2019-12-17 DIAGNOSIS — M1611 Unilateral primary osteoarthritis, right hip: Secondary | ICD-10-CM

## 2019-12-17 DIAGNOSIS — Z8659 Personal history of other mental and behavioral disorders: Secondary | ICD-10-CM

## 2019-12-17 DIAGNOSIS — Z9049 Acquired absence of other specified parts of digestive tract: Secondary | ICD-10-CM

## 2019-12-17 DIAGNOSIS — Z8719 Personal history of other diseases of the digestive system: Secondary | ICD-10-CM

## 2019-12-17 DIAGNOSIS — Z8639 Personal history of other endocrine, nutritional and metabolic disease: Secondary | ICD-10-CM

## 2019-12-17 DIAGNOSIS — M62838 Other muscle spasm: Secondary | ICD-10-CM | POA: Diagnosis not present

## 2019-12-17 DIAGNOSIS — M7062 Trochanteric bursitis, left hip: Secondary | ICD-10-CM

## 2019-12-17 MED ORDER — TIZANIDINE HCL 4 MG PO TABS: 4.0000 mg | ORAL_TABLET | Freq: Every evening | ORAL | 0 refills | Status: DC | PRN

## 2019-12-17 NOTE — Patient Instructions (Addendum)
Hand Exercises Hand exercises can be helpful for almost anyone. These exercises can strengthen the hands, improve flexibility and movement, and increase blood flow to the hands. These results can make work and daily tasks easier. Hand exercises can be especially helpful for people who have joint pain from arthritis or have nerve damage from overuse (carpal tunnel syndrome). These exercises can also help people who have injured a hand. Exercises Most of these hand exercises are gentle stretching and motion exercises. It is usually safe to do them often throughout the day. Warming up your hands before exercise may help to reduce stiffness. You can do this with gentle massage or by placing your hands in warm water for 10-15 minutes. It is normal to feel some stretching, pulling, tightness, or mild discomfort as you begin new exercises. This will gradually improve. Stop an exercise right away if you feel sudden, severe pain or your pain gets worse. Ask your health care provider which exercises are best for you. Knuckle bend or "claw" fist 1. Stand or sit with your arm, hand, and all five fingers pointed straight up. Make sure to keep your wrist straight during the exercise. 2. Gently bend your fingers down toward your palm until the tips of your fingers are touching the top of your palm. Keep your big knuckle straight and just bend the small knuckles in your fingers. 3. Hold this position for __________ seconds. 4. Straighten (extend) your fingers back to the starting position. Repeat this exercise 5-10 times with each hand. Full finger fist 1. Stand or sit with your arm, hand, and all five fingers pointed straight up. Make sure to keep your wrist straight during the exercise. 2. Gently bend your fingers into your palm until the tips of your fingers are touching the middle of your palm. 3. Hold this position for __________ seconds. 4. Extend your fingers back to the starting position, stretching every  joint fully. Repeat this exercise 5-10 times with each hand. Straight fist 1. Stand or sit with your arm, hand, and all five fingers pointed straight up. Make sure to keep your wrist straight during the exercise. 2. Gently bend your fingers at the big knuckle, where your fingers meet your hand, and the middle knuckle. Keep the knuckle at the tips of your fingers straight and try to touch the bottom of your palm. 3. Hold this position for __________ seconds. 4. Extend your fingers back to the starting position, stretching every joint fully. Repeat this exercise 5-10 times with each hand. Tabletop 1. Stand or sit with your arm, hand, and all five fingers pointed straight up. Make sure to keep your wrist straight during the exercise. 2. Gently bend your fingers at the big knuckle, where your fingers meet your hand, as far down as you can while keeping the small knuckles in your fingers straight. Think of forming a tabletop with your fingers. 3. Hold this position for __________ seconds. 4. Extend your fingers back to the starting position, stretching every joint fully. Repeat this exercise 5-10 times with each hand. Finger spread 1. Place your hand flat on a table with your palm facing down. Make sure your wrist stays straight as you do this exercise. 2. Spread your fingers and thumb apart from each other as far as you can until you feel a gentle stretch. Hold this position for __________ seconds. 3. Bring your fingers and thumb tight together again. Hold this position for __________ seconds. Repeat this exercise 5-10 times with each hand.   Making circles 1. Stand or sit with your arm, hand, and all five fingers pointed straight up. Make sure to keep your wrist straight during the exercise. 2. Make a circle by touching the tip of your thumb to the tip of your index finger. 3. Hold for __________ seconds. Then open your hand wide. 4. Repeat this motion with your thumb and each finger on your  hand. Repeat this exercise 5-10 times with each hand. Thumb motion 1. Sit with your forearm resting on a table and your wrist straight. Your thumb should be facing up toward the ceiling. Keep your fingers relaxed as you move your thumb. 2. Lift your thumb up as high as you can toward the ceiling. Hold for __________ seconds. 3. Bend your thumb across your palm as far as you can, reaching the tip of your thumb for the small finger (pinkie) side of your palm. Hold for __________ seconds. Repeat this exercise 5-10 times with each hand. Grip strengthening  1. Hold a stress ball or other soft ball in the middle of your hand. 2. Slowly increase the pressure, squeezing the ball as much as you can without causing pain. Think of bringing the tips of your fingers into the middle of your palm. All of your finger joints should bend when doing this exercise. 3. Hold your squeeze for __________ seconds, then relax. Repeat this exercise 5-10 times with each hand. Contact a health care provider if:  Your hand pain or discomfort gets much worse when you do an exercise.  Your hand pain or discomfort does not improve within 2 hours after you exercise. If you have any of these problems, stop doing these exercises right away. Do not do them again unless your health care provider says that you can. Get help right away if:  You develop sudden, severe hand pain or swelling. If this happens, stop doing these exercises right away. Do not do them again unless your health care provider says that you can. This information is not intended to replace advice given to you by your health care provider. Make sure you discuss any questions you have with your health care provider. Document Revised: 05/09/2018 Document Reviewed: 01/17/2018 Elsevier Patient Education  2020 [employer]   Hip Bursitis Rehab Ask your health care provider which exercises are safe for you. Do exercises exactly as told by your health care  provider and adjust them as directed. It is normal to feel mild stretching, pulling, tightness, or discomfort as you do these exercises. Stop right away if you feel sudden pain or your pain gets worse. Do not begin these exercises until told by your health care provider. Stretching exercise This exercise warms up your muscles and joints and improves the movement and flexibility of your hip. This exercise also helps to relieve pain and stiffness. Iliotibial band stretch An iliotibial band is a strong band of muscle tissue that runs from the outer side of your hip to the outer side of your thigh and knee. 1. Lie on your side with your left / right leg in the top position. 2. Bend your left / right knee and grab your ankle. Stretch out your bottom arm to help you balance. 3. Slowly bring your knee back so your thigh is behind your body. 4. Slowly lower your knee toward the floor until you feel a gentle stretch on the outside of your left / right thigh. If you do not feel a stretch and your knee will not fall farther,   place the heel of your other foot on top of your knee and pull your knee down toward the floor with your foot. 5. Hold this position for __________ seconds. 6. Slowly return to the starting position. Repeat __________ times. Complete this exercise __________ times a day. Strengthening exercises These exercises build strength and endurance in your hip and pelvis. Endurance is the ability to use your muscles for a long time, even after they get tired. Bridge This exercise strengthens the muscles that move your thigh backward (hip extensors). 1. Lie on your back on a firm surface with your knees bent and your feet flat on the floor. 2. Tighten your buttocks muscles and lift your buttocks off the floor until your trunk is level with your thighs. ? Do not arch your back. ? You should feel the muscles working in your buttocks and the back of your thighs. If you do not feel these muscles, slide  your feet 1-2 inches (2.5-5 cm) farther away from your buttocks. ? If this exercise is too easy, try doing it with your arms crossed over your chest. 3. Hold this position for __________ seconds. 4. Slowly lower your hips to the starting position. 5. Let your muscles relax completely after each repetition. Repeat __________ times. Complete this exercise __________ times a day. Squats This exercise strengthens the muscles in front of your thigh and knee (quadriceps). 1. Stand in front of a table, with your feet and knees pointing straight ahead. You may rest your hands on the table for balance but not for support. 2. Slowly bend your knees and lower your hips like you are going to sit in a chair. ? Keep your weight over your heels, not over your toes. ? Keep your lower legs upright so they are parallel with the table legs. ? Do not let your hips go lower than your knees. ? Do not bend lower than told by your health care provider. ? If your hip pain increases, do not bend as low. 3. Hold the squat position for __________ seconds. 4. Slowly push with your legs to return to standing. Do not use your hands to pull yourself to standing. Repeat __________ times. Complete this exercise __________ times a day. Hip hike 1. Stand sideways on a bottom step. Stand on your left / right leg with your other foot unsupported next to the step. You can hold on to the railing or wall for balance if needed. 2. Keep your knees straight and your torso square. Then lift your left / right hip up toward the ceiling. 3. Hold this position for __________ seconds. 4. Slowly let your left / right hip lower toward the floor, past the starting position. Your foot should get closer to the floor. Do not lean or bend your knees. Repeat __________ times. Complete this exercise __________ times a day. Single leg stand 1. Without shoes, stand near a railing or in a doorway. You may hold on to the railing or door frame as needed  for balance. 2. Squeeze your left / right buttock muscles, then lift up your other foot. ? Do not let your left / right hip push out to the side. ? It is helpful to stand in front of a mirror for this exercise so you can watch your hip. 3. Hold this position for __________ seconds. Repeat __________ times. Complete this exercise __________ times a day. This information is not intended to replace advice given to you by your health care provider. Make sure   you discuss any questions you have with your health care provider. Document Revised: 05/13/2018 Document Reviewed: 05/13/2018 Elsevier Patient Education  2020 [employer]   

## 2020-03-18 ENCOUNTER — Other Ambulatory Visit: Payer: Self-pay | Admitting: Family Medicine

## 2020-03-18 DIAGNOSIS — Z1231 Encounter for screening mammogram for malignant neoplasm of breast: Secondary | ICD-10-CM

## 2020-05-17 ENCOUNTER — Ambulatory Visit
Admission: RE | Admit: 2020-05-17 | Discharge: 2020-05-17 | Disposition: A | Discharge status: Home / Self Care | Payer: Managed Care, Other (non HMO) | Source: Ambulatory Visit | Attending: Family Medicine | Admitting: Family Medicine

## 2020-05-17 ENCOUNTER — Other Ambulatory Visit: Payer: Self-pay

## 2020-05-17 DIAGNOSIS — Z1231 Encounter for screening mammogram for malignant neoplasm of breast: Secondary | ICD-10-CM

## 2020-06-02 NOTE — Progress Notes (Deleted)
Office Visit Note  Patient: Charlotte Fox             Date of Birth: [birth year]           MRN: [id number]             PCP: Gweneth Dimitri, MD Referring: Gweneth Dimitri, MD Visit Date: 06/16/2020 Occupation: @GUAROCC @  Subjective:  No chief complaint on file.   History of Present Illness: Charlotte Fox is a 55 y.o. female ***   Activities of Daily Living:  Patient reports morning stiffness for *** {minute/hour:19697}.   Patient {ACTIONS;DENIES/REPORTS:21021675::"Denies"} nocturnal pain.  Difficulty dressing/grooming: {ACTIONS;DENIES/REPORTS:21021675::"Denies"} Difficulty climbing stairs: {ACTIONS;DENIES/REPORTS:21021675::"Denies"} Difficulty getting out of chair: {ACTIONS;DENIES/REPORTS:21021675::"Denies"} Difficulty using hands for taps, buttons, cutlery, and/or writing: {ACTIONS;DENIES/REPORTS:21021675::"Denies"}  No Rheumatology ROS completed.   PMFS History:  Patient Active Problem List   Diagnosis Date Noted  . Travel advice encounter 03/23/2017  . Class 3 severe obesity due to excess calories without serious comorbidity with body mass index (BMI) of 40.0 to 44.9 in adult (HCC) 12/13/2016  . History of bowel resection 12/13/2016  . History of hypertension 12/13/2016  . History of depression 12/13/2016  . History of ankle surgery 12/12/2016  . Fatigue 12/10/2015  . Fibromyalgia 12/09/2015  . Osteoarthritis of both hands 12/09/2015  . Osteoarthritis of right hip 12/09/2015  . Hypertension 12/09/2015  . Elevated cholesterol 12/09/2015  . Migraines 12/09/2015  . Insomnia 12/09/2015  . History of endometriosis 12/09/2015  . Ventral hernia without obstruction or gangrene 09/01/2014  . Mood disorder (HCC) 10/21/2013  . GERD (gastroesophageal reflux disease) 10/21/2013  . Nausea with vomiting 10/21/2013  . Hypokalemia 10/21/2013  . Cellulitis of right leg 10/20/2013  . Cellulitis of right foot 10/20/2013    Past Medical History:  Diagnosis Date  . Acid  reflux   . Anemia   . Arthritis   . Asthma   . Complication of anesthesia    propofol dropped resp during colonoscopy, woke up during procedure  . Depression   . Elevated cholesterol 12/09/2015  . Fibromyalgia   . Headache    hx migraines  . Heart murmur    does not give her any problems  . High cholesterol   . History of endometriosis 12/09/2015  . History of hiatal hernia   . Hypertension   . Insomnia 12/09/2015  . Migraines 12/09/2015  . Osteoarthritis of both hands 12/09/2015  . Osteoarthritis of right hip 12/09/2015   Mild  . Patent foramen ovale    no longer sees cardiology  . Retinal artery occlusion   . Stroke [facility])    retinal artery   several yrs ago,  left eye diminished vision     Family History  Problem Relation Age of Onset  . Hypertension Mother   . Diabetes Mother   . Arthritis Mother   . Hypertension Brother   . Cancer Other   . Heart disease Other    Past Surgical History:  Procedure Laterality Date  . ABDOMINAL HYSTERECTOMY     appendectomy/ colon  . ANKLE SURGERY     at age 26  . APPENDECTOMY    . CHOLECYSTECTOMY N/A 05/21/2014   Procedure: LAPAROSCOPIC CHOLECYSTECTOMY WITH INTRAOPERATIVE CHOLANGIOGRAM;  Surgeon: 05/23/2014, MD;  Location: [facility] OR;  Service: General;  Laterality: N/A;  . COLON SURGERY     removed 5 in colon during hysterectmy [place] due to mass  . COLONOSCOPY     x 2  . KNEE ARTHROPLASTY    .  KNEE SURGERY    . TOE SURGERY    . VENTRAL HERNIA REPAIR  09/01/2014   with mesh  . VENTRAL HERNIA REPAIR N/A 09/01/2014   Procedure: LAPAROSCOPIC VENTRAL HERNIA REPAIR WITH MESH;  Surgeon: Manus Rudd, MD;  Location: [facility] OR;  Service: General;  Laterality: N/A;   Social History   Social History Narrative  . Not on file   Immunization History  Administered Date(s) Administered  . Hepatitis A, Adult 03/23/2017  . Hepatitis B 06/05/1997, 07/17/1997  . PFIZER(Purple Top)SARS-COV-2 Vaccination 04/05/2019, 04/26/2019, 11/21/2019  .  Pneumococcal Polysaccharide-23 01/10/2016  . Td 05/30/2001  . Tdap 01/06/2009  . Typhoid Inactivated 03/23/2017     Objective: Vital Signs: LMP 12/31/2011    Physical Exam   Musculoskeletal Exam: ***  CDAI Exam: CDAI Score: -- Patient Global: --; Provider Global: -- Swollen: --; Tender: -- Joint Exam 06/16/2020   No joint exam has been documented for this visit   There is currently no information documented on the homunculus. Go to the Rheumatology activity and complete the homunculus joint exam.  Investigation: No additional findings.  Imaging: MM 3D SCREEN BREAST BILATERAL  Result Date: 05/17/2020 CLINICAL DATA:  Screening. EXAM: DIGITAL SCREENING BILATERAL MAMMOGRAM WITH TOMOSYNTHESIS AND CAD TECHNIQUE: Bilateral screening digital craniocaudal and mediolateral oblique mammograms were obtained. Bilateral screening digital breast tomosynthesis was performed. The images were evaluated with computer-aided detection. COMPARISON:  Previous exam(s). ACR Breast Density Category b: There are scattered areas of fibroglandular density. FINDINGS: There are no findings suspicious for malignancy. The images were evaluated with computer-aided detection. IMPRESSION: No mammographic evidence of malignancy. A result letter of this screening mammogram will be mailed directly to the patient. RECOMMENDATION: Screening mammogram in one year. (Code:SM-B-01Y) BI-RADS CATEGORY  1: Negative. Electronically Signed   By: Ted Mcalpine M.D.   On: 05/17/2020 16:25    Recent Labs: Lab Results  Component Value Date   WBC 8.4 06/09/2016   HGB 12.9 06/09/2016   PLT 320 06/09/2016   NA 141 06/09/2016   K 4.4 06/09/2016   CL 101 06/09/2016   CO2 25 06/09/2016   GLUCOSE 88 06/09/2016   BUN 14 06/09/2016   CREATININE 0.88 06/09/2016   BILITOT 0.4 06/09/2016   ALKPHOS 75 06/09/2016   AST 16 06/09/2016   ALT 14 06/09/2016   PROT 6.9 06/09/2016   ALBUMIN 4.3 06/09/2016   CALCIUM 9.3 06/09/2016    GFRAA 89 06/09/2016    Speciality Comments: No specialty comments available.  Procedures:  No procedures performed Allergies: Erythromycin, Propofol, Sulfa antibiotics, Levaquin [levofloxacin], Allegra-d [fexofenadine-pseudoephed er], Azithromycin, Penicillins, and Vicodin [hydrocodone-acetaminophen]   Assessment / Plan:     Visit Diagnoses: No diagnosis found.  Orders: No orders of the defined types were placed in this encounter.  No orders of the defined types were placed in this encounter.   Face-to-face time spent with patient was *** minutes. Greater than 50% of time was spent in counseling and coordination of care.  Follow-Up Instructions: No follow-ups on file.   Ellen Henri, CMA  Note - This record has been created using Animal nutritionist.  Chart creation errors have been sought, but may not always  have been located. Such creation errors do not reflect on  the standard of medical care.

## 2020-06-16 ENCOUNTER — Telehealth: Payer: Self-pay

## 2020-06-16 ENCOUNTER — Ambulatory Visit: Payer: Managed Care, Other (non HMO) | Admitting: Rheumatology

## 2020-06-16 DIAGNOSIS — M797 Fibromyalgia: Secondary | ICD-10-CM

## 2020-06-16 DIAGNOSIS — Z8679 Personal history of other diseases of the circulatory system: Secondary | ICD-10-CM

## 2020-06-16 DIAGNOSIS — Z8639 Personal history of other endocrine, nutritional and metabolic disease: Secondary | ICD-10-CM

## 2020-06-16 DIAGNOSIS — M1611 Unilateral primary osteoarthritis, right hip: Secondary | ICD-10-CM

## 2020-06-16 DIAGNOSIS — M19041 Primary osteoarthritis, right hand: Secondary | ICD-10-CM

## 2020-06-16 DIAGNOSIS — Z9049 Acquired absence of other specified parts of digestive tract: Secondary | ICD-10-CM

## 2020-06-16 DIAGNOSIS — Z8669 Personal history of other diseases of the nervous system and sense organs: Secondary | ICD-10-CM

## 2020-06-16 DIAGNOSIS — Z8659 Personal history of other mental and behavioral disorders: Secondary | ICD-10-CM

## 2020-06-16 DIAGNOSIS — Z8719 Personal history of other diseases of the digestive system: Secondary | ICD-10-CM

## 2020-06-16 DIAGNOSIS — M7061 Trochanteric bursitis, right hip: Secondary | ICD-10-CM

## 2020-06-16 DIAGNOSIS — Z87898 Personal history of other specified conditions: Secondary | ICD-10-CM

## 2020-06-16 DIAGNOSIS — M62838 Other muscle spasm: Secondary | ICD-10-CM

## 2020-06-16 DIAGNOSIS — M7712 Lateral epicondylitis, left elbow: Secondary | ICD-10-CM

## 2020-06-16 NOTE — Telephone Encounter (Signed)
Patient cancelled her appointment due to testing positive for Covid yesterday.  Patient states she has cough, congestion and "feels awful."  Patient states she has been prescribed Pfizer oral antiviral treatment.  Patient rescheduled her appointment for 07/07/20.

## 2020-06-23 NOTE — Progress Notes (Signed)
Office Visit Note  Patient: Charlotte Fox             Date of Birth: Sep 01, 1965           MRN: [id number]             PCP: Gweneth Dimitri, MD Referring: Gweneth Dimitri, MD Visit Date: 07/07/2020 Occupation: @GUAROCC @  Subjective:  Pain and numbness in left thigh   History of Present Illness: Charlotte Fox is a 55 y.o. female with history of fibromyalgia and osteoarthritis.  She states she continues to have intermittent pain from fibromyalgia.  Overall her symptoms are fairly well controlled.  She states that she takes muscle relaxers occasionally.  She continues to have some discomfort in her left trochanteric area.  She states that stretching helps.  Recently she has been experiencing some numbness in her left thigh which she believes is coming from her back.  She has been having some discomfort in the lower back region.  Left lateral epicondylitis bothers her intermittently.  Activities of Daily Living:  Patient reports morning stiffness for 10-15 minutes.   Patient Denies nocturnal pain.  Difficulty dressing/grooming: Denies Difficulty climbing stairs: Denies Difficulty getting out of chair: Denies Difficulty using hands for taps, buttons, cutlery, and/or writing: Denies  Review of Systems  Constitutional: Positive for fatigue.  HENT: Negative for mouth sores, mouth dryness and nose dryness.   Eyes: Positive for dryness. Negative for pain and itching.  Respiratory: Negative for difficulty breathing.   Cardiovascular: Negative for chest pain and palpitations.  Gastrointestinal: Positive for constipation and diarrhea. Negative for blood in stool.  Endocrine: Negative for increased urination.  Genitourinary: Negative for difficulty urinating.  Musculoskeletal: Positive for arthralgias, joint pain, myalgias, morning stiffness, muscle tenderness and myalgias.  Skin: Negative for color change, rash and redness.  Allergic/Immunologic: Negative for susceptible to infections.   Neurological: Positive for numbness and headaches. Negative for dizziness and memory loss.  Hematological: Negative for bruising/bleeding tendency.  Psychiatric/Behavioral: Negative for confusion.    PMFS History:  Patient Active Problem List   Diagnosis Date Noted  . Travel advice encounter 03/23/2017  . Class 3 severe obesity due to excess calories without serious comorbidity with body mass index (BMI) of 40.0 to 44.9 in adult (HCC) 12/13/2016  . History of bowel resection 12/13/2016  . History of hypertension 12/13/2016  . History of depression 12/13/2016  . History of ankle surgery 12/12/2016  . Fatigue 12/10/2015  . Fibromyalgia 12/09/2015  . Osteoarthritis of both hands 12/09/2015  . Osteoarthritis of right hip 12/09/2015  . Hypertension 12/09/2015  . Elevated cholesterol 12/09/2015  . Migraines 12/09/2015  . Insomnia 12/09/2015  . History of endometriosis 12/09/2015  . Ventral hernia without obstruction or gangrene 09/01/2014  . Mood disorder (HCC) 10/21/2013  . GERD (gastroesophageal reflux disease) 10/21/2013  . Nausea with vomiting 10/21/2013  . Hypokalemia 10/21/2013  . Cellulitis of right leg 10/20/2013  . Cellulitis of right foot 10/20/2013    Past Medical History:  Diagnosis Date  . Acid reflux   . Anemia   . Arthritis   . Asthma   . Complication of anesthesia    propofol dropped resp during colonoscopy, woke up during procedure  . Depression   . Elevated cholesterol 12/09/2015  . Fibromyalgia   . Headache    hx migraines  . Heart murmur    does not give her any problems  . High cholesterol   . History of endometriosis 12/09/2015  . History of  hiatal hernia   . Hypertension   . Insomnia 12/09/2015  . Migraines 12/09/2015  . Osteoarthritis of both hands 12/09/2015  . Osteoarthritis of right hip 12/09/2015   Mild  . Patent foramen ovale    no longer sees cardiology  . Retinal artery occlusion   . Stroke [facility])    retinal artery   several yrs ago,   left eye diminished vision     Family History  Problem Relation Age of Onset  . Hypertension Mother   . Diabetes Mother   . Arthritis Mother   . Hypertension Brother   . Cancer Other   . Heart disease Other    Past Surgical History:  Procedure Laterality Date  . ABDOMINAL HYSTERECTOMY     appendectomy/ colon  . ANKLE SURGERY     at age 7  . APPENDECTOMY    . CHOLECYSTECTOMY N/A 05/21/2014   Procedure: LAPAROSCOPIC CHOLECYSTECTOMY WITH INTRAOPERATIVE CHOLANGIOGRAM;  Surgeon: Manus Rudd, MD;  Location: [facility] OR;  Service: General;  Laterality: N/A;  . COLON SURGERY     removed 5 in colon during hysterectmy [place] due to mass  . COLONOSCOPY     x 2  . KNEE ARTHROPLASTY    . KNEE SURGERY    . TOE SURGERY    . VENTRAL HERNIA REPAIR  09/01/2014   with mesh  . VENTRAL HERNIA REPAIR N/A 09/01/2014   Procedure: LAPAROSCOPIC VENTRAL HERNIA REPAIR WITH MESH;  Surgeon: Manus Rudd, MD;  Location: [facility] OR;  Service: General;  Laterality: N/A;   Social History   Social History Narrative  . Not on file   Immunization History  Administered Date(s) Administered  . Hepatitis A, Adult 03/23/2017  . Hepatitis B 06/05/1997, 07/17/1997  . PFIZER(Purple Top)SARS-COV-2 Vaccination 04/05/2019, 04/26/2019, 11/21/2019  . Pneumococcal Polysaccharide-23 01/10/2016  . Td 05/30/2001  . Tdap 01/06/2009  . Typhoid Inactivated 03/23/2017     Objective: Vital Signs: BP 138/81 (BP Location: Left Arm, Patient Position: Sitting, Cuff Size: Normal)   Pulse 82   Resp 16   Ht 5' 6.5" (1.689 m)   Wt 252 lb 3.2 oz (114.4 kg)   LMP 12/31/2011   BMI 40.10 kg/m    Physical Exam Vitals and nursing note reviewed.  Constitutional:      Appearance: She is well-developed.  HENT:     Head: Normocephalic and atraumatic.  Eyes:     Conjunctiva/sclera: Conjunctivae normal.  Cardiovascular:     Rate and Rhythm: Normal rate and regular rhythm.     Heart sounds: Normal heart sounds.  Pulmonary:      Effort: Pulmonary effort is normal.     Breath sounds: Normal breath sounds.  Abdominal:     General: Bowel sounds are normal.     Palpations: Abdomen is soft.  Musculoskeletal:     Cervical back: Normal range of motion.  Lymphadenopathy:     Cervical: No cervical adenopathy.  Skin:    General: Skin is warm and dry.     Capillary Refill: Capillary refill takes less than 2 seconds.  Neurological:     Mental Status: She is alert and oriented to person, place, and time.  Psychiatric:        Behavior: Behavior normal.      Musculoskeletal Exam: C-spine was in good range of motion.  She had bilateral trapezius spasm.  She had discomfort in the lower lumbar region.  Shoulder joints, elbow joints, wrist joints, MCPs PIPs and DIPs with good range of motion  with no synovitis.  Hip joints and knee joints with good range of motion.  She has some tenderness on palpation over left lateral epicondyle and left trochanteric area.  There is no tenderness over ankles or MTPs. CDAI Exam: CDAI Score: -- Patient Global: --; Provider Global: -- Swollen: --; Tender: -- Joint Exam 07/07/2020   No joint exam has been documented for this visit   There is currently no information documented on the homunculus. Go to the Rheumatology activity and complete the homunculus joint exam.  Investigation: No additional findings.  Imaging: No results found.  Recent Labs: Lab Results  Component Value Date   WBC 8.4 06/09/2016   HGB 12.9 06/09/2016   PLT 320 06/09/2016   NA 141 06/09/2016   K 4.4 06/09/2016   CL 101 06/09/2016   CO2 25 06/09/2016   GLUCOSE 88 06/09/2016   BUN 14 06/09/2016   CREATININE 0.88 06/09/2016   BILITOT 0.4 06/09/2016   ALKPHOS 75 06/09/2016   AST 16 06/09/2016   ALT 14 06/09/2016   PROT 6.9 06/09/2016   ALBUMIN 4.3 06/09/2016   CALCIUM 9.3 06/09/2016   GFRAA 89 06/09/2016    Speciality Comments: No specialty comments available.  Procedures:  No procedures  performed Allergies: Erythromycin, Propofol, Sulfa antibiotics, Levaquin [levofloxacin], Allegra-d [fexofenadine-pseudoephed er], Azithromycin, Penicillins, and Vicodin [hydrocodone-acetaminophen]   Assessment / Plan:     Visit Diagnoses: Fibromyalgia-she has intermittent flares of fibromyalgia.  Overall she is doing better.  Trapezius muscle spasm -she uses robaxin 500 mg 1 to 2 tablets daily as needed for muscle spasms and zanaflex 4 mg po at bedtime as needed.  She states she takes medications only occasionally.  Side effects of muscle relaxers were discussed.  Lateral epicondylitis, left elbow-she has intermittent discomfort which gets better with the stretching exercises.  Trochanteric bursitis of both hips-she has some tenderness over bilateral trochanteric area.  It is more prominent on the left side than right.  Stretching exercises were emphasized.  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  Complains of some paresthesias in her left thigh intermittently.  I offered x-rays which she declined.  Weight loss diet and exercise was discussed.  She declined physical therapy.  Have given her a handout on back exercises.  Primary osteoarthritis of both hands-she had no DIP or PIP thickening and no synovitis.  Primary osteoarthritis of right hip-she had good range of motion without discomfort.  History of insomnia-good sleep hygiene was discussed.  Other medical problems are listed as follows:  History of depression  History of hypertension-blood pressure is under control.  History of hypercholesterolemia  History of gastroesophageal reflux (GERD)  History of migraine  History of bowel resection  Orders: No orders of the defined types were placed in this encounter.  Meds ordered this encounter  Medications  . methocarbamol (ROBAXIN) 500 MG tablet    Sig: Take 1 tablet (500 mg total) by mouth 2 (two) times  daily as needed.    Dispense:  60 tablet    Refill:  1     Follow-Up Instructions: Return in about 6 months (around 01/06/2021) for Osteoarthritis,FMS.   Pollyann Savoy, MD  Note - This record has been created using Animal nutritionist.  Chart creation errors have been sought, but may not always  have been located. Such creation errors do not reflect on  the standard of medical care.

## 2020-07-07 ENCOUNTER — Other Ambulatory Visit: Payer: Self-pay

## 2020-07-07 ENCOUNTER — Ambulatory Visit: Payer: Managed Care, Other (non HMO) | Admitting: Rheumatology

## 2020-07-07 ENCOUNTER — Encounter: Payer: Self-pay | Admitting: Rheumatology

## 2020-07-07 VITALS — BP 138/81 | HR 82 | Resp 16 | Ht 66.5 in | Wt 252.2 lb

## 2020-07-07 DIAGNOSIS — Z8639 Personal history of other endocrine, nutritional and metabolic disease: Secondary | ICD-10-CM

## 2020-07-07 DIAGNOSIS — Z8669 Personal history of other diseases of the nervous system and sense organs: Secondary | ICD-10-CM

## 2020-07-07 DIAGNOSIS — Z8679 Personal history of other diseases of the circulatory system: Secondary | ICD-10-CM

## 2020-07-07 DIAGNOSIS — M5442 Lumbago with sciatica, left side: Secondary | ICD-10-CM

## 2020-07-07 DIAGNOSIS — M19042 Primary osteoarthritis, left hand: Secondary | ICD-10-CM

## 2020-07-07 DIAGNOSIS — M62838 Other muscle spasm: Secondary | ICD-10-CM | POA: Diagnosis not present

## 2020-07-07 DIAGNOSIS — M19041 Primary osteoarthritis, right hand: Secondary | ICD-10-CM

## 2020-07-07 DIAGNOSIS — M797 Fibromyalgia: Secondary | ICD-10-CM

## 2020-07-07 DIAGNOSIS — M1611 Unilateral primary osteoarthritis, right hip: Secondary | ICD-10-CM

## 2020-07-07 DIAGNOSIS — M7062 Trochanteric bursitis, left hip: Secondary | ICD-10-CM

## 2020-07-07 DIAGNOSIS — M7061 Trochanteric bursitis, right hip: Secondary | ICD-10-CM

## 2020-07-07 DIAGNOSIS — Z8659 Personal history of other mental and behavioral disorders: Secondary | ICD-10-CM

## 2020-07-07 DIAGNOSIS — Z9049 Acquired absence of other specified parts of digestive tract: Secondary | ICD-10-CM

## 2020-07-07 DIAGNOSIS — M7712 Lateral epicondylitis, left elbow: Secondary | ICD-10-CM | POA: Diagnosis not present

## 2020-07-07 DIAGNOSIS — Z87898 Personal history of other specified conditions: Secondary | ICD-10-CM

## 2020-07-07 DIAGNOSIS — G8929 Other chronic pain: Secondary | ICD-10-CM

## 2020-07-07 DIAGNOSIS — Z8719 Personal history of other diseases of the digestive system: Secondary | ICD-10-CM

## 2020-07-07 MED ORDER — METHOCARBAMOL 500 MG PO TABS: 500.0000 mg | ORAL_TABLET | Freq: Two times a day (BID) | ORAL | 1 refills | Status: DC | PRN

## 2020-07-07 NOTE — Patient Instructions (Signed)

## 2020-12-28 NOTE — Progress Notes (Signed)
Office Visit Note  Patient: Charlotte Fox             Date of Birth: 1965-12-01           MRN: [id number]             PCP: Cari Caraway, MD Referring: Cari Caraway, MD Visit Date: 01/06/2021 Occupation: @GUAROCC @  Subjective:  Trapezius muscle tension and tenderness bilaterally   History of Present Illness: Charlotte Fox is a 55 y.o. female with history of fibromyalgia and osteoarthritis.  She experiences intermittent myalgias and muscle tenderness due to fibromyalgia.  She is having trapezius muscle tension and tenderness bilaterally.  She requested trigger point injections today which has alleviated her symptoms in the past.  She is taking methocarbamol 500 mg twice daily and tizanidine 4 mg at bedtime as needed for muscle spasms.  She continues to experience intermittent discomfort due to trochanteric bursitis of both hips.  She denies any groin pain.  She has been performing stretching exercises on a regular basis which alleviated her symptoms.  Her migraines have been better controlled on the current treatment regimen. She denies any new medical conditions since her last office visit.    Activities of Daily Living:  Patient reports morning stiffness for 10 minutes.   Patient Reports nocturnal pain.  Difficulty dressing/grooming: Denies Difficulty climbing stairs: Denies Difficulty getting out of chair: Denies Difficulty using hands for taps, buttons, cutlery, and/or writing: Reports  Review of Systems  Constitutional:  Positive for fatigue.  HENT:  Negative for mouth sores, mouth dryness and nose dryness.   Eyes:  Positive for dryness. Negative for pain and visual disturbance.  Respiratory:  Negative for cough, hemoptysis and difficulty breathing.   Cardiovascular:  Negative for chest pain, palpitations, hypertension and swelling in legs/feet.  Gastrointestinal:  Positive for constipation and diarrhea. Negative for blood in stool.  Endocrine: Positive for cold  intolerance and heat intolerance. Negative for increased urination.  Genitourinary:  Negative for difficulty urinating and painful urination.  Musculoskeletal:  Positive for joint pain, joint pain, muscle weakness, morning stiffness and muscle tenderness. Negative for joint swelling, myalgias and myalgias.  Skin:  Negative for color change, pallor, rash, hair loss, nodules/bumps, skin tightness, ulcers and sensitivity to sunlight.  Allergic/Immunologic: Negative for susceptible to infections.  Neurological:  Negative for dizziness and headaches.  Hematological:  Negative for bruising/bleeding tendency and swollen glands.  Psychiatric/Behavioral:  Positive for sleep disturbance. Negative for depressed mood. The patient is not nervous/anxious.    PMFS History:  Patient Active Problem List   Diagnosis Date Noted   Travel advice encounter 03/23/2017   Class 3 severe obesity due to excess calories without serious comorbidity with body mass index (BMI) of 40.0 to 44.9 in adult [facility]) 12/13/2016   History of bowel resection 12/13/2016   History of hypertension 12/13/2016   History of depression 12/13/2016   History of ankle surgery 12/12/2016   Fatigue 12/10/2015   Fibromyalgia 12/09/2015   Osteoarthritis of both hands 12/09/2015   Osteoarthritis of right hip 12/09/2015   Hypertension 12/09/2015   Elevated cholesterol 12/09/2015   Migraines 12/09/2015   Insomnia 12/09/2015   History of endometriosis 12/09/2015   Ventral hernia without obstruction or gangrene 09/01/2014   Mood disorder ([place]) 10/21/2013   GERD (gastroesophageal reflux disease) 10/21/2013   Nausea with vomiting 10/21/2013   Hypokalemia 10/21/2013   Cellulitis of right leg 10/20/2013   Cellulitis of right foot 10/20/2013    Past Medical History:  Diagnosis Date   Acid reflux    Anemia    Arthritis    Asthma    Complication of anesthesia    propofol dropped resp during colonoscopy, woke up during procedure   Depression     Elevated cholesterol 12/09/2015   Fibromyalgia    Headache    hx migraines   Heart murmur    does not give her any problems   High cholesterol    History of endometriosis 12/09/2015   History of hiatal hernia    Hypertension    Insomnia 12/09/2015   Migraines 12/09/2015   Osteoarthritis of both hands 12/09/2015   Osteoarthritis of right hip 12/09/2015   Mild   Patent foramen ovale    no longer sees cardiology   Retinal artery occlusion    Stroke [facility])    retinal artery   several yrs ago,  left eye diminished vision     Family History  Problem Relation Age of Onset   Hypertension Mother    Diabetes Mother    Arthritis Mother    Hypertension Brother    Cancer Other    Heart disease Other    Past Surgical History:  Procedure Laterality Date   ABDOMINAL HYSTERECTOMY     appendectomy/ colon   ANKLE SURGERY     at age 37   APPENDECTOMY     CHOLECYSTECTOMY N/A 05/21/2014   Procedure: LAPAROSCOPIC CHOLECYSTECTOMY WITH INTRAOPERATIVE CHOLANGIOGRAM;  Surgeon: Manus Rudd, MD;  Location: [facility] OR;  Service: General;  Laterality: N/A;   COLON SURGERY     removed 5 in colon during hysterectmy [place] due to mass   COLONOSCOPY     x 2   KNEE ARTHROPLASTY     KNEE SURGERY     TOE SURGERY     VENTRAL HERNIA REPAIR  09/01/2014   with mesh   VENTRAL HERNIA REPAIR N/A 09/01/2014   Procedure: LAPAROSCOPIC VENTRAL HERNIA REPAIR WITH MESH;  Surgeon: Manus Rudd, MD;  Location: [facility] OR;  Service: General;  Laterality: N/A;   Social History   Social History Narrative   Not on file   Immunization History  Administered Date(s) Administered   Hepatitis A, Adult 03/23/2017   Hepatitis B 06/05/1997, 07/17/1997   PFIZER(Purple Top)SARS-COV-2 Vaccination 04/05/2019, 04/26/2019, 11/21/2019   Pneumococcal Polysaccharide-23 01/10/2016   Td 05/30/2001   Tdap 01/06/2009   Typhoid Inactivated 03/23/2017     Objective: Vital Signs: BP 130/78 (BP Location: Left Arm, Patient Position:  Sitting, Cuff Size: Normal)   Pulse 69   Resp 16   Ht 5' 6.5" (1.689 m)   Wt 250 lb (113.4 kg)   LMP 12/31/2011   BMI 39.75 kg/m    Physical Exam Vitals and nursing note reviewed.  Constitutional:      Appearance: She is well-developed.  HENT:     Head: Normocephalic and atraumatic.  Eyes:     Conjunctiva/sclera: Conjunctivae normal.  Pulmonary:     Effort: Pulmonary effort is normal.  Abdominal:     Palpations: Abdomen is soft.  Musculoskeletal:     Cervical back: Normal range of motion.  Skin:    General: Skin is warm and dry.     Capillary Refill: Capillary refill takes less than 2 seconds.  Neurological:     Mental Status: She is alert and oriented to person, place, and time.  Psychiatric:        Behavior: Behavior normal.     Musculoskeletal Exam: C-spine, thoracic spine, and lumbar spine have  good ROM.  Trapezius muscle tension and tenderness bilaterally.  Shoulder joints, elbow joints, wrist joints, MCPs, PIPs, and DIPs have good ROM with discomfort.  Complete fist formation bilaterally.  Hip joints have good range of motion with no groin pain.  Tenderness over bilateral trochanteric bursa.  Knee joints have good range of motion with no warmth or effusion.  Ankle joints have good range of motion with no tenderness or joint swelling.  CDAI Exam: CDAI Score: -- Patient Global: --; Provider Global: -- Swollen: --; Tender: -- Joint Exam 01/06/2021   No joint exam has been documented for this visit   There is currently no information documented on the homunculus. Go to the Rheumatology activity and complete the homunculus joint exam.  Investigation: No additional findings.  Imaging: No results found.  Recent Labs: Lab Results  Component Value Date   WBC 8.4 06/09/2016   HGB 12.9 06/09/2016   PLT 320 06/09/2016   NA 141 06/09/2016   K 4.4 06/09/2016   CL 101 06/09/2016   CO2 25 06/09/2016   GLUCOSE 88 06/09/2016   BUN 14 06/09/2016   CREATININE 0.88  06/09/2016   BILITOT 0.4 06/09/2016   ALKPHOS 75 06/09/2016   AST 16 06/09/2016   ALT 14 06/09/2016   PROT 6.9 06/09/2016   ALBUMIN 4.3 06/09/2016   CALCIUM 9.3 06/09/2016   GFRAA 89 06/09/2016    Speciality Comments: No specialty comments available.  Procedures:  Trigger Point Inj  Date/Time: 01/06/2021 8:16 AM Performed by: Ofilia Neas, PA-C Authorized by: Ofilia Neas, PA-C   Consent Given by:  Patient Site marked: the procedure site was marked   Timeout: prior to procedure the correct patient, procedure, and site was verified   Indications:  Pain Total # of Trigger Points:  2 Location: neck   Needle Size:  27 G Approach:  Dorsal Medications #1:  0.5 mL lidocaine 1 %; 10 mg triamcinolone acetonide 40 MG/ML Medications #2:  0.5 mL lidocaine 1 %; 10 mg triamcinolone acetonide 40 MG/ML Patient tolerance:  Patient tolerated the procedure well with no immediate complications Allergies: Erythromycin, Propofol, Sulfa antibiotics, Levofloxacin, Fexofenadine-pseudoephed er, Hydrocodone, Penicillin g sodium, Sulfamethoxazole, Azithromycin, Penicillins, and Vicodin [hydrocodone-acetaminophen]   Assessment / Plan:     Visit Diagnoses: Fibromyalgia: She continues to experience intermittent myalgias and muscle tenderness due to fibromyalgia.  She presents today with trapezius muscle tension and tenderness bilaterally.  She has been experiencing muscle spasms intermittently.  She has been trying to perform stretching exercises on a daily basis but has had persistent tightness and discomfort.  She has been taking methocarbamol 500 mg 1 tablet daily and tizanidine 4 mg at bedtime for muscle spasms.  She requested trigger point injections today.  She tolerated the procedure well.  Procedure note was completed above.  Aftercare was discussed.  She has also been having some increased discomfort due to trochanteric bursitis of both hips.  Discussed the importance of performing stretching  exercises on a daily basis.  She was advised to notify us if her symptoms persist or worsen.  Discussed the importance of regular exercise and good sleep hygiene.  She will follow-up in the office in 6 months.  Trapezius muscle spasm -She presents today with trapezius muscle tension and tenderness bilaterally.  She has been taking robaxin 500 mg 1 to 2 tablets daily as needed for muscle spasms and zanaflex 4 mg po at bedtime as needed.  She requested trigger point injections today.  She tolerated procedure  well.  Procedure note was completed above.  Aftercare was discussed.  She was advised to notify us if her discomfort persists or worsens.  Primary osteoarthritis of both hands: She is not experiencing any joint tenderness and has no inflammation on examination.  She was able to make a complete fist bilaterally.  Discussed the importance of joint protection and muscle strengthening.  Lateral epicondylitis of both elbows: She has tenderness to palpation over the lateral epicondyles of both elbows. Discussed the importance of performing stretching exercises daily.  She uses voltaren gel topically as needed for pain relief.   Trochanteric bursitis of both hips: She has tenderness palpation over bilateral trochanteric bursa.  Her symptoms are typically exacerbated by walking prolonged distances.  She perform stretching exercises on a regular basis which alleviates her symptoms.  Primary osteoarthritis of right hip: She has good range of motion of the right hip joint on examination today.  No groin pain.  History of insomnia: She takes tizanidine 4 mg at bedtime as needed for muscle spasms and insomnia.  Other medical conditions are listed as follows:   History of hypertension: BP was 130/78 today in the office.  Discussed the importance of close blood pressure monitoring following the cortisone injections today.  History of depression  History of gastroesophageal reflux (GERD)  History of  hypercholesterolemia  History of bowel resection  History of migraine  Orders: Orders Placed This Encounter  Procedures   Trigger Point Inj   No orders of the defined types were placed in this encounter.     Follow-Up Instructions: Return in about 6 months (around 07/07/2021) for Fibromyalgia, Osteoarthritis.   Ofilia Neas, PA-C  Note - This record has been created using Dragon software.  Chart creation errors have been sought, but may not always  have been located. Such creation errors do not reflect on  the standard of medical care.

## 2021-01-06 ENCOUNTER — Other Ambulatory Visit: Payer: Self-pay

## 2021-01-06 ENCOUNTER — Ambulatory Visit: Payer: Managed Care, Other (non HMO) | Admitting: Physician Assistant

## 2021-01-06 ENCOUNTER — Encounter: Payer: Self-pay | Admitting: Physician Assistant

## 2021-01-06 VITALS — BP 130/78 | HR 69 | Resp 16 | Ht 66.5 in | Wt 250.0 lb

## 2021-01-06 DIAGNOSIS — M7711 Lateral epicondylitis, right elbow: Secondary | ICD-10-CM

## 2021-01-06 DIAGNOSIS — M7061 Trochanteric bursitis, right hip: Secondary | ICD-10-CM

## 2021-01-06 DIAGNOSIS — M19042 Primary osteoarthritis, left hand: Secondary | ICD-10-CM

## 2021-01-06 DIAGNOSIS — Z8669 Personal history of other diseases of the nervous system and sense organs: Secondary | ICD-10-CM

## 2021-01-06 DIAGNOSIS — Z9049 Acquired absence of other specified parts of digestive tract: Secondary | ICD-10-CM

## 2021-01-06 DIAGNOSIS — M1611 Unilateral primary osteoarthritis, right hip: Secondary | ICD-10-CM

## 2021-01-06 DIAGNOSIS — M7712 Lateral epicondylitis, left elbow: Secondary | ICD-10-CM

## 2021-01-06 DIAGNOSIS — Z8719 Personal history of other diseases of the digestive system: Secondary | ICD-10-CM

## 2021-01-06 DIAGNOSIS — Z87898 Personal history of other specified conditions: Secondary | ICD-10-CM

## 2021-01-06 DIAGNOSIS — M7062 Trochanteric bursitis, left hip: Secondary | ICD-10-CM

## 2021-01-06 DIAGNOSIS — M797 Fibromyalgia: Secondary | ICD-10-CM

## 2021-01-06 DIAGNOSIS — Z8639 Personal history of other endocrine, nutritional and metabolic disease: Secondary | ICD-10-CM

## 2021-01-06 DIAGNOSIS — M19041 Primary osteoarthritis, right hand: Secondary | ICD-10-CM

## 2021-01-06 DIAGNOSIS — Z8659 Personal history of other mental and behavioral disorders: Secondary | ICD-10-CM

## 2021-01-06 DIAGNOSIS — M62838 Other muscle spasm: Secondary | ICD-10-CM | POA: Diagnosis not present

## 2021-01-06 DIAGNOSIS — Z8679 Personal history of other diseases of the circulatory system: Secondary | ICD-10-CM

## 2021-01-06 MED ORDER — TRIAMCINOLONE ACETONIDE 40 MG/ML IJ SUSP
10.0000 mg | INTRAMUSCULAR | Status: AC | PRN
  Administered 2021-01-06: 10 mg via INTRAMUSCULAR

## 2021-01-06 MED ORDER — LIDOCAINE HCL 1 % IJ SOLN
0.5000 mL | INTRAMUSCULAR | Status: AC | PRN
  Administered 2021-01-06: .5 mL

## 2021-01-06 MED ORDER — METHOCARBAMOL 500 MG PO TABS: 500.0000 mg | ORAL_TABLET | Freq: Two times a day (BID) | ORAL | 1 refills | Status: DC | PRN

## 2021-01-06 MED ORDER — TIZANIDINE HCL 4 MG PO TABS: 4.0000 mg | ORAL_TABLET | Freq: Every evening | ORAL | 0 refills | Status: DC | PRN

## 2021-01-06 NOTE — Telephone Encounter (Signed)
Patient requested refills of zanaflex and robaxin. Please review and send to the pharmacy. Thanks!

## 2021-06-21 ENCOUNTER — Other Ambulatory Visit: Payer: Self-pay | Admitting: Family Medicine

## 2021-06-21 DIAGNOSIS — Z1231 Encounter for screening mammogram for malignant neoplasm of breast: Secondary | ICD-10-CM

## 2021-06-29 ENCOUNTER — Ambulatory Visit
Admission: RE | Admit: 2021-06-29 | Discharge: 2021-06-29 | Disposition: A | Discharge status: Home / Self Care | Payer: Managed Care, Other (non HMO) | Source: Ambulatory Visit

## 2021-06-29 DIAGNOSIS — Z1231 Encounter for screening mammogram for malignant neoplasm of breast: Secondary | ICD-10-CM

## 2021-06-29 NOTE — Progress Notes (Signed)
Office Visit Note  Patient: Charlotte Fox             Date of Birth: Aug 03, 1965           MRN: [id number]             PCP: Gweneth Dimitri, MD Referring: Gweneth Dimitri, MD Visit Date: 07/12/2021 Occupation: @  Subjective:  Pain in multiple joints and muscles  History of Present Illness: Charlotte Fox is a 56 y.o. female with history of osteoarthritis and fibromyalgia syndrome.  She continues to have generalized pain and discomfort.  She also suffers from insomnia.  She states she went to [employer] recently and had to walk a lot.  She states due to severe pain in her bilateral hips, knee joints and her feet she had to use a wheelchair.  She notices some puffiness in her hands and her ankles.  She has to take muscle relaxer on a regular basis to function.  Her pain level on 0-10 has been anywhere from 3-7 depending on the activities.  Activities of Daily Living:  Patient reports morning stiffness for 5 minutes.   Patient Reports nocturnal pain.  Difficulty dressing/grooming: Denies Difficulty climbing stairs: Denies Difficulty getting out of chair: Denies Difficulty using hands for taps, buttons, cutlery, and/or writing: Reports  Review of Systems  Constitutional:  Positive for fatigue.  HENT:  Negative for mouth dryness.   Eyes:  Positive for dryness.  Respiratory:  Negative for shortness of breath.   Cardiovascular:  Negative for swelling in legs/feet.  Gastrointestinal:  Negative for constipation.  Endocrine: Positive for cold intolerance.  Genitourinary:  Negative for difficulty urinating.  Musculoskeletal:  Positive for joint pain, joint pain, joint swelling, morning stiffness and muscle tenderness.  Skin:  Negative for color change, rash and sensitivity to sunlight.  Allergic/Immunologic: Positive for susceptible to infections.  Neurological:  Positive for numbness.  Hematological:  Negative for bruising/bleeding tendency.  Psychiatric/Behavioral:   Positive for depressed mood. Negative for sleep disturbance.     PMFS History:  Patient Active Problem List   Diagnosis Date Noted   Travel advice encounter 03/23/2017   Class 3 severe obesity due to excess calories without serious comorbidity with body mass index (BMI) of 40.0 to 44.9 in adult [facility]) 12/13/2016   History of bowel resection 12/13/2016   History of hypertension 12/13/2016   History of depression 12/13/2016   History of ankle surgery 12/12/2016   Fatigue 12/10/2015   Fibromyalgia 12/09/2015   Osteoarthritis of both hands 12/09/2015   Osteoarthritis of right hip 12/09/2015   Hypertension 12/09/2015   Elevated cholesterol 12/09/2015   Migraines 12/09/2015   Insomnia 12/09/2015   History of endometriosis 12/09/2015   Ventral hernia without obstruction or gangrene 09/01/2014   Mood disorder ([facility]) 10/21/2013   GERD (gastroesophageal reflux disease) 10/21/2013   Nausea with vomiting 10/21/2013   Hypokalemia 10/21/2013   Cellulitis of right leg 10/20/2013   Cellulitis of right foot 10/20/2013    Past Medical History:  Diagnosis Date   Acid reflux    Anemia    Arthritis    Asthma    Complication of anesthesia    propofol dropped resp during colonoscopy, woke up during procedure   Depression    Elevated cholesterol 12/09/2015   Fibromyalgia    Headache    hx migraines   Heart murmur    does not give her any problems   High cholesterol    History of endometriosis 12/09/2015  History of hiatal hernia    Hypertension    Insomnia 12/09/2015   Migraines 12/09/2015   Osteoarthritis of both hands 12/09/2015   Osteoarthritis of right hip 12/09/2015   Mild   Patent foramen ovale    no longer sees cardiology   Retinal artery occlusion    Stroke [facility])    retinal artery   several yrs ago,  left eye diminished vision     Family History  Problem Relation Age of Onset   Hypertension Mother    Diabetes Mother    Arthritis Mother    Hypertension Brother    Cancer  Other    Heart disease Other    Past Surgical History:  Procedure Laterality Date   ABDOMINAL HYSTERECTOMY     appendectomy/ colon   ANKLE SURGERY     at age 56   APPENDECTOMY     CHOLECYSTECTOMY N/A 05/21/2014   Procedure: LAPAROSCOPIC CHOLECYSTECTOMY WITH INTRAOPERATIVE CHOLANGIOGRAM;  Surgeon: Manus RuddMatthew Tsuei, MD;  Location: [facility] OR;  Service: General;  Laterality: N/A;   COLON SURGERY     removed 5 in colon during hysterectmy [place] due to mass   COLONOSCOPY     x 2   KNEE ARTHROPLASTY     KNEE SURGERY     TOE SURGERY     VENTRAL HERNIA REPAIR  09/01/2014   with mesh   VENTRAL HERNIA REPAIR N/A 09/01/2014   Procedure: LAPAROSCOPIC VENTRAL HERNIA REPAIR WITH MESH;  Surgeon: Manus RuddMatthew Tsuei, MD;  Location: [facility] OR;  Service: General;  Laterality: N/A;   Social History   Social History Narrative   Not on file   Immunization History  Administered Date(s) Administered   Hepatitis A, Adult 03/23/2017   Hepatitis B 06/05/1997, 07/17/1997   PFIZER(Purple Top)SARS-COV-2 Vaccination 04/05/2019, 04/26/2019, 11/21/2019   Pneumococcal Polysaccharide-23 01/10/2016   Td 05/30/2001   Tdap 01/06/2009   Typhoid Inactivated 03/23/2017     Objective: Vital Signs: BP (!) 141/77 (BP Location: Left Arm, Patient Position: Sitting, Cuff Size: Normal)   Pulse 79   Resp 16   Ht 5' 6.5" (1.689 m)   Wt 236 lb (107 kg)   LMP 12/31/2011   BMI 37.52 kg/m    Physical Exam Vitals and nursing note reviewed.  Constitutional:      Appearance: She is well-developed.  HENT:     Head: Normocephalic and atraumatic.  Eyes:     Conjunctiva/sclera: Conjunctivae normal.  Cardiovascular:     Rate and Rhythm: Normal rate and regular rhythm.     Heart sounds: Normal heart sounds.  Pulmonary:     Effort: Pulmonary effort is normal.     Breath sounds: Normal breath sounds.  Abdominal:     General: Bowel sounds are normal.     Palpations: Abdomen is soft.  Musculoskeletal:     Cervical back: Normal  range of motion.  Lymphadenopathy:     Cervical: No cervical adenopathy.  Skin:    General: Skin is warm and dry.     Capillary Refill: Capillary refill takes less than 2 seconds.  Neurological:     Mental Status: She is alert and oriented to person, place, and time.  Psychiatric:        Behavior: Behavior normal.      Musculoskeletal Exam: C-spine was in good range of motion.  Lumbar spine was in good range of motion.  She has some discomfort range of motion of the lumbar spine.  She had paraspinal muscle spasm.  Shoulder joints,  elbow joints, wrist joints, MCPs PIPs and DIPs with good range of motion with no synovitis.  Hip joints in good range of motion.  She had mild tenderness over trochanteric bursa.  Knee joints in good range of motion without any tenderness or swelling.  She had no tenderness over ankles or MTPs.  No pedal edema was noted.  CDAI Exam: CDAI Score: -- Patient Global: --; Provider Global: -- Swollen: --; Tender: -- Joint Exam 07/12/2021   No joint exam has been documented for this visit   There is currently no information documented on the homunculus. Go to the Rheumatology activity and complete the homunculus joint exam.  Investigation: No additional findings.  Imaging: MM 3D SCREEN BREAST BILATERAL  Result Date: 07/01/2021 CLINICAL DATA:  Screening. EXAM: DIGITAL SCREENING BILATERAL MAMMOGRAM WITH TOMOSYNTHESIS AND CAD TECHNIQUE: Bilateral screening digital craniocaudal and mediolateral oblique mammograms were obtained. Bilateral screening digital breast tomosynthesis was performed. The images were evaluated with computer-aided detection. COMPARISON:  Previous exam(s). ACR Breast Density Category b: There are scattered areas of fibroglandular density. FINDINGS: There are no findings suspicious for malignancy. IMPRESSION: No mammographic evidence of malignancy. A result letter of this screening mammogram will be mailed directly to the patient. RECOMMENDATION:  Screening mammogram in one year. (Code:SM-B-01Y) BI-RADS CATEGORY  1: Negative. Electronically Signed   By: Beckie Salts M.D.   On: 07/01/2021 08:20    Recent Labs: Lab Results  Component Value Date   WBC 8.4 06/09/2016   HGB 12.9 06/09/2016   PLT 320 06/09/2016   NA 141 06/09/2016   K 4.4 06/09/2016   CL 101 06/09/2016   CO2 25 06/09/2016   GLUCOSE 88 06/09/2016   BUN 14 06/09/2016   CREATININE 0.88 06/09/2016   BILITOT 0.4 06/09/2016   ALKPHOS 75 06/09/2016   AST 16 06/09/2016   ALT 14 06/09/2016   PROT 6.9 06/09/2016   ALBUMIN 4.3 06/09/2016   CALCIUM 9.3 06/09/2016   GFRAA 89 06/09/2016    Speciality Comments: No specialty comments available.  Procedures:  No procedures performed Allergies: Erythromycin, Propofol, Sulfa antibiotics, Levofloxacin, Fexofenadine-pseudoephed er, Hydrocodone, Penicillin g sodium, Sulfamethoxazole, Azithromycin, Penicillins, and Vicodin [hydrocodone-acetaminophen]   Assessment / Plan:     Visit Diagnoses: Fibromyalgia-she continues to have generalized pain and discomfort and positive tender points.  She describes her pain anywhere from 3-7 on the scale of 0-10 on a regular basis.  She states she is unable to function without the muscle relaxers.  She takes methocarbamol 1 to 2 tablets a day and tizanidine every night.  Prescription refills for methocarbamol and tizanidine were sent per patient's request.  Side effects of both medications were discussed at length.  Trapezius muscle spasm -she had bilateral trapezius spasm.  Stretching exercises were discussed.  robaxin 500 mg 1 to 2 tablets daily as needed for muscle spasms and zanaflex 4 mg po at bedtime as needed.   Primary osteoarthritis of both hands-she has mild osteoarthritis in her hands.  She gives history of intermittent swelling in her hands.  No synovitis was noted.  Joint protection muscle strengthening was discussed.  Trochanteric bursitis of both hips-she had tenderness over bilateral  trochanteric bursa.  She states that trochanteric bursitis gets worse after prolonged walking.  She has intermittent nocturnal pain.  Stretching exercises were emphasized.  Lateral epicondylitis of both elbows-she had no tenderness on my examination today.  Primary osteoarthritis of right hip-she had good range of motion of bilateral hip joints without any discomfort.  Midline lower  back pain without sciatica-she has been having increased lower back pain.  She had paravertebral muscle spasm.  There was no muscle tenderness.  There is no history of radiculopathy.  I offered physical therapy which she declined.  I gave her a handout on back exercises.  I advised her to contact us if her symptoms do not improve.  History of insomnia-she gives history of chronic insomnia.  History of depression-she is on Prozac.  History of hypertension-blood pressure was mildly elevated.  She was advised to monitor blood pressure closely.  Other medical problems are listed as follows:  History of hypercholesterolemia  History of gastroesophageal reflux (GERD)  History of migraine  History of bowel resection  Orders: No orders of the defined types were placed in this encounter.  Meds ordered this encounter  Medications   tiZANidine (ZANAFLEX) 4 MG tablet    Sig: Take 1 tablet (4 mg total) by mouth at bedtime as needed for muscle spasms.    Dispense:  30 tablet    Refill:  2   methocarbamol (ROBAXIN) 500 MG tablet    Sig: Take 1 tablet (500 mg total) by mouth 2 (two) times daily as needed.    Dispense:  60 tablet    Refill:  1     Follow-Up Instructions: Return in about 6 months (around 01/11/2022) for Osteoarthritis.   Pollyann Savoy, MD  Note - This record has been created using Animal nutritionist.  Chart creation errors have been sought, but may not always  have been located. Such creation errors do not reflect on  the standard of medical care.

## 2021-07-12 ENCOUNTER — Encounter: Payer: Self-pay | Admitting: Rheumatology

## 2021-07-12 ENCOUNTER — Ambulatory Visit: Payer: Managed Care, Other (non HMO) | Admitting: Rheumatology

## 2021-07-12 VITALS — BP 141/77 | HR 79 | Resp 16 | Ht 66.5 in | Wt 236.0 lb

## 2021-07-12 DIAGNOSIS — M797 Fibromyalgia: Secondary | ICD-10-CM | POA: Diagnosis not present

## 2021-07-12 DIAGNOSIS — Z8719 Personal history of other diseases of the digestive system: Secondary | ICD-10-CM

## 2021-07-12 DIAGNOSIS — Z8679 Personal history of other diseases of the circulatory system: Secondary | ICD-10-CM

## 2021-07-12 DIAGNOSIS — Z8659 Personal history of other mental and behavioral disorders: Secondary | ICD-10-CM

## 2021-07-12 DIAGNOSIS — G8929 Other chronic pain: Secondary | ICD-10-CM

## 2021-07-12 DIAGNOSIS — M545 Low back pain, unspecified: Secondary | ICD-10-CM

## 2021-07-12 DIAGNOSIS — M7712 Lateral epicondylitis, left elbow: Secondary | ICD-10-CM

## 2021-07-12 DIAGNOSIS — M1611 Unilateral primary osteoarthritis, right hip: Secondary | ICD-10-CM

## 2021-07-12 DIAGNOSIS — M7061 Trochanteric bursitis, right hip: Secondary | ICD-10-CM

## 2021-07-12 DIAGNOSIS — M19042 Primary osteoarthritis, left hand: Secondary | ICD-10-CM

## 2021-07-12 DIAGNOSIS — M19041 Primary osteoarthritis, right hand: Secondary | ICD-10-CM | POA: Diagnosis not present

## 2021-07-12 DIAGNOSIS — M7062 Trochanteric bursitis, left hip: Secondary | ICD-10-CM

## 2021-07-12 DIAGNOSIS — M62838 Other muscle spasm: Secondary | ICD-10-CM | POA: Diagnosis not present

## 2021-07-12 DIAGNOSIS — Z8639 Personal history of other endocrine, nutritional and metabolic disease: Secondary | ICD-10-CM

## 2021-07-12 DIAGNOSIS — Z87898 Personal history of other specified conditions: Secondary | ICD-10-CM

## 2021-07-12 DIAGNOSIS — Z9049 Acquired absence of other specified parts of digestive tract: Secondary | ICD-10-CM

## 2021-07-12 DIAGNOSIS — M7711 Lateral epicondylitis, right elbow: Secondary | ICD-10-CM

## 2021-07-12 DIAGNOSIS — Z8669 Personal history of other diseases of the nervous system and sense organs: Secondary | ICD-10-CM

## 2021-07-12 MED ORDER — METHOCARBAMOL 500 MG PO TABS: 500.0000 mg | ORAL_TABLET | Freq: Two times a day (BID) | ORAL | 1 refills | Status: DC | PRN

## 2021-07-12 MED ORDER — TIZANIDINE HCL 4 MG PO TABS: 4.0000 mg | ORAL_TABLET | Freq: Every evening | ORAL | 2 refills | Status: DC | PRN

## 2021-07-12 NOTE — Patient Instructions (Signed)

## 2021-08-19 ENCOUNTER — Ambulatory Visit: Payer: Managed Care, Other (non HMO) | Admitting: Internal Medicine

## 2021-08-19 ENCOUNTER — Encounter: Payer: Self-pay | Admitting: Internal Medicine

## 2021-08-19 VITALS — BP 142/90 | HR 83 | Temp 97.9°F | Resp 16 | Ht 65.75 in | Wt 238.0 lb

## 2021-08-19 DIAGNOSIS — D649 Anemia, unspecified: Secondary | ICD-10-CM | POA: Insufficient documentation

## 2021-08-19 DIAGNOSIS — E785 Hyperlipidemia, unspecified: Secondary | ICD-10-CM | POA: Insufficient documentation

## 2021-08-19 DIAGNOSIS — I639 Cerebral infarction, unspecified: Secondary | ICD-10-CM | POA: Insufficient documentation

## 2021-08-19 DIAGNOSIS — G4733 Obstructive sleep apnea (adult) (pediatric): Secondary | ICD-10-CM | POA: Insufficient documentation

## 2021-08-19 DIAGNOSIS — F419 Anxiety disorder, unspecified: Secondary | ICD-10-CM | POA: Insufficient documentation

## 2021-08-19 DIAGNOSIS — K30 Functional dyspepsia: Secondary | ICD-10-CM | POA: Insufficient documentation

## 2021-08-19 DIAGNOSIS — L253 Unspecified contact dermatitis due to other chemical products: Secondary | ICD-10-CM | POA: Diagnosis not present

## 2021-08-19 DIAGNOSIS — E781 Pure hyperglyceridemia: Secondary | ICD-10-CM | POA: Insufficient documentation

## 2021-08-19 DIAGNOSIS — E559 Vitamin D deficiency, unspecified: Secondary | ICD-10-CM | POA: Insufficient documentation

## 2021-08-19 DIAGNOSIS — J45991 Cough variant asthma: Secondary | ICD-10-CM | POA: Insufficient documentation

## 2021-08-19 DIAGNOSIS — E8940 Asymptomatic postprocedural ovarian failure: Secondary | ICD-10-CM | POA: Insufficient documentation

## 2021-08-19 DIAGNOSIS — Z860100 Personal history of colon polyps, unspecified: Secondary | ICD-10-CM | POA: Insufficient documentation

## 2021-08-19 DIAGNOSIS — J301 Allergic rhinitis due to pollen: Secondary | ICD-10-CM | POA: Insufficient documentation

## 2021-08-19 DIAGNOSIS — J3089 Other allergic rhinitis: Secondary | ICD-10-CM | POA: Diagnosis not present

## 2021-08-19 DIAGNOSIS — T783XXA Angioneurotic edema, initial encounter: Secondary | ICD-10-CM

## 2021-08-19 DIAGNOSIS — J4520 Mild intermittent asthma, uncomplicated: Secondary | ICD-10-CM | POA: Insufficient documentation

## 2021-08-19 DIAGNOSIS — G43009 Migraine without aura, not intractable, without status migrainosus: Secondary | ICD-10-CM | POA: Insufficient documentation

## 2021-08-19 NOTE — Patient Instructions (Signed)
PEG allergy  -Cannot skin test today due to recent antihistamine use and current symptoms -Increase loratadine to twice a day until symptoms resolve -Give [place] a call at that point and we will schedule you for PEG skin testing 3 days after you stop your antihistamines -Continue to avoid PEG containing products -After we get more information from the skin testing we may consider patch testing as well if skin test is negative  Follow up: as above  Thank you so much for letting me partake in your care today.  Don't hesitate to reach out if you have any additional concerns!  Ferol Luz, MD  [facility] [place] [place]

## 2021-08-19 NOTE — Progress Notes (Signed)
New Patient Note  RE: Charlotte Fox MRN: [id number] DOB: Jul 16, 1965 Date of Office Visit: 08/19/2021  Consult requested by: Marda Stalker, PA-C Primary care provider: Cari Caraway, MD  Chief Complaint: Allergy Testing (Polyethylene glycol ) and Allergic Reaction (During pandemic got a rash on her face. Doctor told her that it was from her makeup so she changed it all. July 4th holiday got generic zyrtec and medication is in that medication symptoms got worse. Colonoscopy: the medication they gave her had it in it as well. (Sneezing, watery eyes, redness, headache))  History of Present Illness: I had the pleasure of seeing Charlotte Fox for initial evaluation at the [facility] on 08/19/2021. She is a 56 y.o. female, who is referred here by Cari Caraway, MD for the evaluation of possible PEG.  History obtained from patient  and  chart reviews .  In 2020 she developed erythematous dermatitis on face which was diagnosed with PEG induced contact dermatitis from a dermatologist.  Rash resolved but would recur monthly.  Since then she had a coworker comment intermittently on facial swelling and erythema, but she did not know what was causing it.  She has been sent home from work for her symptoms.  At the end of June she had a migraine and had increased her pain medications to include ibuprofen, tramadol and  ubrelvy .  She started develop flushing and took OTC ceterizine and flushing and burning sensation worsened.  She forgot to take all her meds due to being on vacation and symptoms resolved.  She then had a colonoscopy and had propofol for sedation.  When she woke up from sedation she had rhinnorhea, sneezing and required oxygen.  Symptoms progressed on the way home from the procedure and developed flushing.  She took OTC cetirizine and flushing worsened and she developed headaches.  She quickly realized that all of these medications contained PEG and attributes her  symptoms to her PEG allergy.  She feel like she is having a current flare and had eyelid swelling and erythema starting yesterday.  She attributes current flare to using soap at her office which contains PEG.  She is currently taking loratadine for her symptoms.  She has previously been treated with prednisone with last course 2 weeks ago.  Assessment and Plan: Ismari is a 56 y.o. female with: Angioedema, initial encounter  Contact dermatitis due to chemicals  Other allergic rhinitis Plan: Patient Instructions   PEG allergy  -Cannot skin test today due to recent antihistamine use and current symptoms -Increase loratadine to twice a day until symptoms resolve -Give [place] a call at that point and we will schedule you for PEG skin testing 3 days after you stop your antihistamines -Continue to avoid PEG containing products -After we get more information from the skin testing we may consider patch testing as well if skin test is negative  Follow up: as above  Thank you so much for letting me partake in your care today.  Don't hesitate to reach out if you have any additional concerns!  Roney Marion, MD  [facility] [place] [place]  No follow-ups on file.  No orders of the defined types were placed in this encounter.  Lab Orders  No laboratory test(s) ordered today    Other allergy screening: Asthma: no Rhino conjunctivitis: no Food allergy: no Medication allergy: yes Hymenoptera allergy: no Urticaria: no Eczema:no History of recurrent infections suggestive of immunodeficency: no  Diagnostics: Skin Testing:  Deferred due to recent antihistamines use.  Results interpreted by myself and discussed with patient/family.   Past Medical History: Patient Active Problem List   Diagnosis Date Noted   Allergic rhinitis due to pollen 08/19/2021   Anemia 08/19/2021   Anxiety 08/19/2021   Cerebral infarction (HCC) 08/19/2021   Cough variant asthma 08/19/2021    Functional dyspepsia 08/19/2021   Migraine without aura, not intractable, without status migrainosus 08/19/2021   Mild intermittent asthma 08/19/2021   Obstructive sleep apnea (adult) (pediatric) 08/19/2021   Personal history of colonic polyps 08/19/2021   Surgical menopause 08/19/2021   Vitamin D deficiency 08/19/2021   Hyperlipidemia, unspecified 08/19/2021   Hypertriglyceridemia 08/19/2021   Travel advice encounter 03/23/2017   Class 3 severe obesity due to excess calories without serious comorbidity with body mass index (BMI) of 40.0 to 44.9 in adult [facility]) 12/13/2016   History of bowel resection 12/13/2016   History of hypertension 12/13/2016   History of depression 12/13/2016   History of ankle surgery 12/12/2016   Fatigue 12/10/2015   Fibromyalgia 12/09/2015   Osteoarthritis of both hands 12/09/2015   Osteoarthritis of right hip 12/09/2015   Hypertension 12/09/2015   Elevated cholesterol 12/09/2015   Migraines 12/09/2015   Insomnia 12/09/2015   History of endometriosis 12/09/2015   Ventral hernia without obstruction or gangrene 09/01/2014   Mood disorder (HCC) 10/21/2013   GERD (gastroesophageal reflux disease) 10/21/2013   Nausea with vomiting 10/21/2013   Hypokalemia 10/21/2013   Cellulitis of right leg 10/20/2013   Cellulitis of right foot 10/20/2013   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   Optic neuropathy 10/09/2012   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   Past Medical History:  Diagnosis Date   Acid reflux    Anemia    Arthritis    Asthma    Complication of anesthesia    propofol dropped resp during colonoscopy, woke up during procedure   Depression    Elevated cholesterol 12/09/2015   Fibromyalgia    Headache    hx migraines   Heart murmur    does not give her any problems   High cholesterol    History of endometriosis 12/09/2015   History of hiatal hernia    Hypertension    Insomnia 12/09/2015   Migraines 12/09/2015   Osteoarthritis of  both hands 12/09/2015   Osteoarthritis of right hip 12/09/2015   Mild   Patent foramen ovale    no longer sees cardiology   Retinal artery occlusion    Stroke [facility])    retinal artery   several yrs ago,  left eye diminished vision    Past Surgical History: Past Surgical History:  Procedure Laterality Date   ABDOMINAL HYSTERECTOMY     appendectomy/ colon   ANKLE SURGERY     at age 48   APPENDECTOMY     CHOLECYSTECTOMY N/A 05/21/2014   Procedure: LAPAROSCOPIC CHOLECYSTECTOMY WITH INTRAOPERATIVE CHOLANGIOGRAM;  Surgeon: Manus Rudd, MD;  Location: [facility] OR;  Service: General;  Laterality: N/A;   COLON SURGERY     removed 5 in colon during hysterectmy [place] due to mass   COLONOSCOPY     x 2   KNEE ARTHROPLASTY     KNEE SURGERY     TOE SURGERY     VENTRAL HERNIA REPAIR  09/01/2014   with mesh   VENTRAL HERNIA REPAIR N/A 09/01/2014   Procedure: LAPAROSCOPIC VENTRAL HERNIA REPAIR WITH MESH;  Surgeon: Manus Rudd, MD;  Location: [facility] OR;  Service: General;  Laterality:  N/A;   Medication List:  Current Outpatient Medications  Medication Sig Dispense Refill   cholecalciferol (VITAMIN D) 1000 UNITS tablet Take 4,000 Units by mouth daily.     EPINEPHrine 0.3 mg/0.3 mL IJ SOAJ injection SMARTSIG:0.3 Milliliter(s) IM Once PRN     estradiol (CLIMARA - DOSED IN MG/24 HR) 0.05 mg/24hr patch Place 0.05 mg onto the skin once a week. Change patch every Tuesday.     FLUoxetine (PROZAC) 20 MG capsule Take 20 mg by mouth daily.     loratadine (CLARITIN) 10 MG tablet Take 10 mg by mouth daily as needed for allergies.     Misc Natural Products (TART CHERRY ADVANCED PO) Take 1 capsule by mouth daily at 12 noon.     mometasone-formoterol (DULERA) 100-5 MCG/ACT AERO Inhale 2 puffs as needed into the lungs for wheezing.     omeprazole (PRILOSEC) 20 MG capsule Take 20 mg by mouth daily.     PROAIR RESPICLICK 108 (90 Base) MCG/ACT AEPB INHALE 2 PUFFS BY MOUTH EVERY 4 6 HOURS AS NEEDED     promethazine  (PHENERGAN) 25 MG tablet Take 25 mg by mouth every 6 (six) hours as needed for nausea or vomiting.     rosuvastatin (CRESTOR) 40 MG tablet Take 40 mg by mouth daily.     telmisartan (MICARDIS) 80 MG tablet Take 80 mg by mouth daily.     tiZANidine (ZANAFLEX) 4 MG tablet Take 1 tablet (4 mg total) by mouth at bedtime as needed for muscle spasms. 30 tablet 2   acetaminophen (TYLENOL) 500 MG tablet Take 1,000 mg by mouth every 6 (six) hours as needed for mild pain.  (Patient not taking: Reported on 08/19/2021)     diclofenac sodium (VOLTAREN) 1 % GEL Voltaren Gel 3 grams to 3 large joints upto TID 3 TUBES with 3 refills (Patient not taking: Reported on 08/19/2021) 3 Tube 3   hydrocortisone cream 1 % Apply 1 application topically as needed for itching. (Patient not taking: Reported on 08/19/2021)     ibuprofen (ADVIL,MOTRIN) 200 MG tablet Take 3 tablets (600 mg total) by mouth every 6 (six) hours as needed for headache or mild pain (Mild pain that is not relieved by Tylenol.). (Patient not taking: Reported on 08/19/2021)     LORazepam (ATIVAN) 0.5 MG tablet Take 0.25-0.5 mg by mouth 2 (two) times daily as needed for anxiety or sleep. (Patient not taking: Reported on 08/19/2021)     metFORMIN (GLUCOPHAGE-XR) 500 MG 24 hr tablet Take 500 mg by mouth daily. (Patient not taking: Reported on 08/19/2021)     methocarbamol (ROBAXIN) 500 MG tablet Take 1 tablet (500 mg total) by mouth 2 (two) times daily as needed. (Patient not taking: Reported on 08/19/2021) 60 tablet 1   montelukast (SINGULAIR) 10 MG tablet  (Patient not taking: Reported on 08/19/2021)     Na Sulfate-K Sulfate-Mg Sulf 17.5-3.13-1.6 GM/177ML SOLN Take by mouth as directed.     traMADol (ULTRAM) 50 MG tablet Take 50 mg by mouth every 6 (six) hours as needed for moderate pain.  (Patient not taking: Reported on 08/19/2021)     Ubrogepant (UBRELVY) 50 MG TABS Take 50 mg by mouth as needed. (Patient not taking: Reported on 08/19/2021)     No current  facility-administered medications for this visit.   Allergies: Allergies  Allergen Reactions   Erythromycin Itching    Other reaction(s): itching   Propofol Other (See Comments)    Respirations dropped really low, and Pt woke up  during procedure   Sulfa Antibiotics Anaphylaxis and Rash   Levofloxacin Nausea And Vomiting and Other (See Comments)    Migraine.  Other reaction(s): body aches/headache   Fexofenadine-Pseudoephed Er     Other reaction(s): feels bad, sycope   Hydrocodone     Other reaction(s): vomiting   Hydrocodone-Acetaminophen Nausea And Vomiting   Penicillin G Sodium Hives   Sulfamethoxazole     Other reaction(s): anaphylaxis   Azithromycin Other (See Comments)    Stomach cramps Other reaction(s): stomach upset   Penicillins Hives   Vicodin [Hydrocodone-Acetaminophen] Nausea And Vomiting   Social History: Social History   Socioeconomic History   Marital status: Single    Spouse name: Not on file   Number of children: Not on file   Years of education: Not on file   Highest education level: Not on file  Occupational History   Not on file  Tobacco Use   Smoking status: Never    Passive exposure: Current (out in public)   Smokeless tobacco: Never  Vaping Use   Vaping Use: Never used  Substance and Sexual Activity   Alcohol use: Yes    Comment: once yearly   Drug use: No   Sexual activity: Not on file  Other Topics Concern   Not on file  Social History Narrative   Not on file   Social Determinants of Health   Financial Resource Strain: Not on file  Food Insecurity: No Food Insecurity (11/27/2019)   Hunger Vital Sign    Worried About Running Out of Food in the Last Year: Never true    Ran Out of Food in the Last Year: Never true  Transportation Needs: Not on file  Physical Activity: Not on file  Stress: Not on file  Social Connections: Not on file   Lives in a townhome that is 55 years old.  There are no roaches in the house and bed is 2 feet  off the floor.  She does not have dust mite precautions on better pillows.  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  She does not have a HEPA filter in her home and home is not near an [place] area. Smoking: No exposure Occupation: Careers information officer HistoryFreight forwarder in the house: no Charity fundraiser in the family room: no Carpet in the bedroom: yes Heating: heat pump Cooling: heat pump Pet: no  Family History: Family History  Problem Relation Age of Onset   Hypertension Mother    Diabetes Mother    Arthritis Mother    Hypertension Brother    Cancer Other    Heart disease Other      ROS: All others negative except as noted per HPI.   Objective: BP (!) 142/90   Pulse 83   Temp 97.9 F (36.6 C) (Temporal)   Resp 16   Ht 5' 5.75" (1.67 m)   Wt 238 lb (108 kg)   LMP 12/31/2011   SpO2 97%   BMI 38.71 kg/m  Body mass index is 38.71 kg/m.  General Appearance:  Alert, cooperative, no distress, appears stated age  Head:  Normocephalic, without obvious abnormality, atraumatic  Eyes:  Conjunctiva clear, EOM's intact,  left eyelid edema  Nose: Nares normal,   Throat: Lips, tongue normal; teeth and gums normal,   Neck: Supple, symmetrical  Lungs:   , Respirations unlabored, no coughing  Heart:  , Appears well perfused  Extremities: No edema  Skin: Skin color, texture,  turgor normal, perioral erythema  Neurologic: No gross deficits   The plan was reviewed with the patient/family, and all questions/concerned were addressed.  It was my pleasure to see Elizabe today and participate in her care. Please feel free to contact me with any questions or concerns.  Sincerely,  Roney Marion, MD Allergy & Immunology  [facility] office: [phone] [facility] office: [phone]

## 2021-08-26 ENCOUNTER — Encounter: Payer: Self-pay | Admitting: Internal Medicine

## 2021-08-26 ENCOUNTER — Ambulatory Visit: Payer: Managed Care, Other (non HMO) | Admitting: Internal Medicine

## 2021-08-26 VITALS — BP 130/80 | HR 86 | Temp 97.8°F | Resp 16

## 2021-08-26 DIAGNOSIS — T783XXA Angioneurotic edema, initial encounter: Secondary | ICD-10-CM

## 2021-08-26 DIAGNOSIS — L253 Unspecified contact dermatitis due to other chemical products: Secondary | ICD-10-CM

## 2021-08-26 DIAGNOSIS — T783XXD Angioneurotic edema, subsequent encounter: Secondary | ICD-10-CM

## 2021-08-26 NOTE — Progress Notes (Signed)
Follow Up Note  RE: Charlotte Fox MRN: [id number] DOB: 03/22/1965 Date of Office Visit: 08/26/2021  Referring provider: Gweneth Dimitri, MD Primary care provider: Gweneth Dimitri, MD  Chief Complaint: Si Raider Challenge ([facility]. Skin test)  History of Present Illness: I had the pleasure of seeing Charlotte Fox for a follow up visit at the [facility] [place] on 08/26/2021. She is a 56 y.o. female, who is being followed for PEG allergy . Her previous allergy office visit was on 08/19/21 with Dr. Marlynn Perking. Today is a skin testing and follow up visit.  History obtained from patient  and  chart review  .  She presents today for follow up skin testing for PEG allergy.  She was able to come off antihistamines 3 days ago after flare resolved.  She would like to proceed with testing. She reports fully eliminating PEG from all medications this week and symptoms have slowly begun to improve    Reaction History: In 2020 she developed erythematous dermatitis on face which was diagnosed with PEG induced contact dermatitis from a dermatologist.  Rash resolved but would recur monthly.  Since then she had a coworker comment intermittently on facial swelling and erythema, but she did not know what was causing it.  She has been sent home from work for her symptoms.   At the end of June she had a migraine and had increased her pain medications to include ibuprofen, tramadol and  ubrelvy .  She started develop flushing and took OTC ceterizine and flushing and burning sensation worsened.  She forgot to take all her meds due to being on vacation and symptoms resolved.  She then had a colonoscopy and had propofol for sedation.  When she woke up from sedation she had rhinnorhea, sneezing and required oxygen.  Symptoms progressed on the way home from the procedure and developed flushing.  She took OTC cetirizine and flushing worsened and she developed headaches.  She quickly realized that all of these  medications contained PEG and attributes her symptoms to her PEG allergy  Assessment and Plan: Charlotte Fox is a 56 y.o. female with: Angioedema, subsequent encounter - Plan: Allergy Test  Contact dermatitis due to chemicals - Plan: Allergy Test Plan: Patient Instructions   PEG allergy  -Skin testing today was positive to PEG in duplicated  - Based on symptoms I do not think epipen is indicated at this time as symptoms are not consistent with anaphylaxis, perhaps systemic contact dermatitis with PEG  -Continue loratadine 10mg  daily for preventative use due to risk of accidental PEG exposure  -Continue to avoid PEG containing products  Follow up: 3 months   Thank you so much for letting me partake in your care today.  Don't hesitate to reach out if you have any additional concerns!  , MD  [facility] [place] [place] No follow-ups on file.  No orders of the defined types were placed in this encounter.   Lab Orders  No laboratory test(s) ordered today   Diagnostics:  Skin Testing:  to [facility] for PEG allergy testing  . Skin testing positive to PEG  Results interpreted by myself during this encounter and discussed with patient/family.  Airborne Adult Perc - 08/26/21 1417     Time Antigen Placed 1352    Allergen Manufacturer 08/28/21    Location Arm    Number of Test 4    1. Control-Buffer 50% Glycerol Negative    2. Control-Histamine 1 mg/ml 3+  3. Albumin saline Omitted    Other 3+    Other 3+    Comments Other stnads for Miralax             Medication List:  Current Outpatient Medications  Medication Sig Dispense Refill   cholecalciferol (VITAMIN D) 1000 UNITS tablet Take 4,000 Units by mouth daily.     EPINEPHrine 0.3 mg/0.3 mL IJ SOAJ injection SMARTSIG:0.3 Milliliter(s) IM Once PRN     estradiol (CLIMARA - DOSED IN MG/24 HR) 0.05 mg/24hr patch Place 0.05 mg onto the skin once a week. Change patch every Tuesday.     FLUoxetine  (PROZAC) 20 MG capsule Take 20 mg by mouth daily.     loratadine (CLARITIN) 10 MG tablet Take 10 mg by mouth daily as needed for allergies.     Misc Natural Products (TART CHERRY ADVANCED PO) Take 1 capsule by mouth daily at 12 noon.     mometasone-formoterol (DULERA) 100-5 MCG/ACT AERO Inhale 2 puffs as needed into the lungs for wheezing.     omeprazole (PRILOSEC) 20 MG capsule Take 20 mg by mouth daily.     PROAIR RESPICLICK 108 (90 Base) MCG/ACT AEPB INHALE 2 PUFFS BY MOUTH EVERY 4 6 HOURS AS NEEDED     promethazine (PHENERGAN) 25 MG tablet Take 25 mg by mouth every 6 (six) hours as needed for nausea or vomiting.     rosuvastatin (CRESTOR) 40 MG tablet Take 40 mg by mouth daily.     telmisartan (MICARDIS) 80 MG tablet Take 80 mg by mouth daily.     tiZANidine (ZANAFLEX) 4 MG tablet Take 1 tablet (4 mg total) by mouth at bedtime as needed for muscle spasms. 30 tablet 2   acetaminophen (TYLENOL) 500 MG tablet Take 1,000 mg by mouth every 6 (six) hours as needed for mild pain.  (Patient not taking: Reported on 08/19/2021)     diclofenac sodium (VOLTAREN) 1 % GEL Voltaren Gel 3 grams to 3 large joints upto TID 3 TUBES with 3 refills (Patient not taking: Reported on 08/19/2021) 3 Tube 3   hydrocortisone cream 1 % Apply 1 application topically as needed for itching. (Patient not taking: Reported on 08/19/2021)     ibuprofen (ADVIL,MOTRIN) 200 MG tablet Take 3 tablets (600 mg total) by mouth every 6 (six) hours as needed for headache or mild pain (Mild pain that is not relieved by Tylenol.). (Patient not taking: Reported on 08/19/2021)     LORazepam (ATIVAN) 0.5 MG tablet Take 0.25-0.5 mg by mouth 2 (two) times daily as needed for anxiety or sleep. (Patient not taking: Reported on 08/19/2021)     metFORMIN (GLUCOPHAGE-XR) 500 MG 24 hr tablet Take 500 mg by mouth daily. (Patient not taking: Reported on 08/19/2021)     methocarbamol (ROBAXIN) 500 MG tablet Take 1 tablet (500 mg total) by mouth 2 (two) times  daily as needed. (Patient not taking: Reported on 08/19/2021) 60 tablet 1   montelukast (SINGULAIR) 10 MG tablet  (Patient not taking: Reported on 08/19/2021)     Na Sulfate-K Sulfate-Mg Sulf 17.5-3.13-1.6 GM/177ML SOLN Take by mouth as directed.     traMADol (ULTRAM) 50 MG tablet Take 50 mg by mouth every 6 (six) hours as needed for moderate pain.  (Patient not taking: Reported on 08/19/2021)     Ubrogepant (UBRELVY) 50 MG TABS Take 50 mg by mouth as needed. (Patient not taking: Reported on 08/19/2021)     No current facility-administered medications for this visit.   Allergies:  Allergies  Allergen Reactions   Erythromycin Itching    Other reaction(s): itching   Propofol Other (See Comments)    Respirations dropped really low, and Pt woke up during procedure   Sulfa Antibiotics Anaphylaxis and Rash   Levofloxacin Nausea And Vomiting and Other (See Comments)    Migraine.  Other reaction(s): body aches/headache   Fexofenadine-Pseudoephed Er     Other reaction(s): feels bad, sycope   Hydrocodone     Other reaction(s): vomiting   Hydrocodone-Acetaminophen Nausea And Vomiting   Penicillin G Sodium Hives   Sulfamethoxazole     Other reaction(s): anaphylaxis   Azithromycin Other (See Comments)    Stomach cramps Other reaction(s): stomach upset   Penicillins Hives   Vicodin [Hydrocodone-Acetaminophen] Nausea And Vomiting   I reviewed her past medical history, social history, family history, and environmental history and no significant changes have been reported from her previous visit.  ROS: All others negative except as noted per HPI.   Objective: BP 130/80   Pulse 86   Temp 97.8 F (36.6 C) (Temporal)   Resp 16   LMP 12/31/2011   SpO2 97%  There is no height or weight on file to calculate BMI. General Appearance:  Alert, cooperative, no distress, appears stated age  Head:  Normocephalic, without obvious abnormality, atraumatic  Eyes:  Conjunctiva clear, EOM's intact  Nose:  Nares normal,   Throat: Lips, tongue normal; teeth and gums normal,   Neck: Supple, symmetrical  Lungs:   , Respirations unlabored, no coughing  Heart:  , Appears well perfused  Extremities: No edema  Skin: Skin color, texture, turgor normal, no rashes or lesions on visualized portions of skin  Neurologic: No gross deficits   Previous notes and tests were reviewed. The plan was reviewed with the patient/family, and all questions/concerned were addressed.  It was my pleasure to see Charlotte Fox today and participate in her care. Please feel free to contact me with any questions or concerns.  Sincerely,  Ferol Luz, MD  Allergy & Immunology  [facility] Office: [phone]

## 2021-08-26 NOTE — Patient Instructions (Signed)
PEG allergy  -Skin testing today was positive to PEG in duplicated  - Based on symptoms I do not think epipen is indicated at this time as symptoms are not consistent with anaphylaxis, perhaps systemic contact dermatitis with PEG  -Continue loratadine 10mg  daily for preventative use due to risk of accidental PEG exposure  -Continue to avoid PEG containing products  Follow up: 3 months   Thank you so much for letting me partake in your care today.  Don't hesitate to reach out if you have any additional concerns!  , MD  [facility] [place] [place]

## 2021-09-08 ENCOUNTER — Telehealth: Payer: Self-pay | Admitting: Rheumatology

## 2021-09-08 NOTE — Telephone Encounter (Signed)
Please check with the pharmacist.

## 2021-09-08 NOTE — Telephone Encounter (Signed)
Patient called the office stating she was recently diagnosed with an allergy to Polyethylene glycol which is in the Methocarbamol Dr. Corliss Skains has her on for her Fibromyalgia. Patient states she has tried to go without but is struggling. Patient would like an alternative medication that does not have Polyethylene glycol in it.

## 2021-09-08 NOTE — Telephone Encounter (Signed)
Patient advised we would need to clarify with our pharmacist prior to sending in a prescription. Patient advised Charlotte Fox is out of the office until Wednesday and we will contact her after [facility] has had a chance to research and let us know.

## 2021-09-14 NOTE — Telephone Encounter (Signed)
Patient advised Devki researched the Methocarbamol. Advised patient of the statement below by [facility]. Patient advised she is okay to stay on methocarbamol she would like.  If she wants to switch then we can switch to Skelaxin. Patient states she will reach out to the dispensing pharmacy to inquire about ingredients. Patient will call the office to advise if she wants to continue the Methocarbamol or switch to the Skelaxin.

## 2021-09-14 NOTE — Telephone Encounter (Signed)
Please discuss the note written by defecate with the patient.  If she is okay to stay on methocarbamol she may.  If she wants to switch then we can switch to Skelaxin.

## 2021-09-14 NOTE — Telephone Encounter (Signed)
As far as I can see, PEG is not an excipient in methocarbamol tablets. Injectable methocarbamol may have PEG as excipient.  I've found these following ingredients in methocarbamol tablets (none are polyethylene glycol):  Colloidal Silicon Dioxide, Lactose Monohydrate, Magnesium Stearate, Methylcellulose, Microcrystalline Cellulose, Pregelatinized Starch and Sodium Starch Glycolate.  Patient should be able to continue tablets. She can also ask pharmacist at dispensing pharmacy to confirm this with bottle insert that should have excipients specific to the manufacturer she receives  Chesley Mires, PharmD, MPH, BCPS, CPP Clinical Pharmacist (Rheumatology and Pulmonology)

## 2021-09-30 ENCOUNTER — Ambulatory Visit: Payer: Managed Care, Other (non HMO) | Admitting: Internal Medicine

## 2021-11-07 NOTE — Progress Notes (Unsigned)
Office Visit Note  Patient: Charlotte Fox             Date of Birth: 08-01-1965           MRN: [id number]             PCP: Cari Caraway, MD Referring: Cari Caraway, MD Visit Date: 11/08/2021 Occupation: @GUAROCC @  Subjective:  No chief complaint on file.   History of Present Illness: Charlotte Fox is a 56 y.o. female ***   Activities of Daily Living:  Patient reports morning stiffness for *** {minute/hour:19697}.   Patient {ACTIONS;DENIES/REPORTS:21021675::"Denies"} nocturnal pain.  Difficulty dressing/grooming: {ACTIONS;DENIES/REPORTS:21021675::"Denies"} Difficulty climbing stairs: {ACTIONS;DENIES/REPORTS:21021675::"Denies"} Difficulty getting out of chair: {ACTIONS;DENIES/REPORTS:21021675::"Denies"} Difficulty using hands for taps, buttons, cutlery, and/or writing: {ACTIONS;DENIES/REPORTS:21021675::"Denies"}  No Rheumatology ROS completed.   PMFS History:  Patient Active Problem List   Diagnosis Date Noted   Allergic rhinitis due to pollen 08/19/2021   Anemia 08/19/2021   Anxiety 08/19/2021   Cerebral infarction ([place]) 08/19/2021   Cough variant asthma 08/19/2021   Functional dyspepsia 08/19/2021   Migraine without aura, not intractable, without status migrainosus 08/19/2021   Mild intermittent asthma 08/19/2021   Obstructive sleep apnea (adult) (pediatric) 08/19/2021   Personal history of colonic polyps 08/19/2021   Surgical menopause 08/19/2021   Vitamin D deficiency 08/19/2021   Hyperlipidemia, unspecified 08/19/2021   Hypertriglyceridemia 08/19/2021   Travel advice encounter 03/23/2017   Class 3 severe obesity due to excess calories without serious comorbidity with body mass index (BMI) of 40.0 to 44.9 in adult [facility]) 12/13/2016   History of bowel resection 12/13/2016   History of hypertension 12/13/2016   History of depression 12/13/2016   History of ankle surgery 12/12/2016   Fatigue 12/10/2015   Fibromyalgia 12/09/2015   Osteoarthritis of both  hands 12/09/2015   Osteoarthritis of right hip 12/09/2015   Hypertension 12/09/2015   Elevated cholesterol 12/09/2015   Migraines 12/09/2015   Insomnia 12/09/2015   History of endometriosis 12/09/2015   Ventral hernia without obstruction or gangrene 09/01/2014   Mood disorder ([place]) 10/21/2013   GERD (gastroesophageal reflux disease) 10/21/2013   Nausea with vomiting 10/21/2013   Hypokalemia 10/21/2013   Cellulitis of right leg 10/20/2013   Cellulitis of right foot 10/20/2013   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   Optic neuropathy 10/09/2012   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    Past Medical History:  Diagnosis Date   Acid reflux    Anemia    Arthritis    Asthma    Complication of anesthesia    propofol dropped resp during colonoscopy, woke up during procedure   Depression    Elevated cholesterol 12/09/2015   Fibromyalgia    Headache    hx migraines   Heart murmur    does not give her any problems   High cholesterol    History of endometriosis 12/09/2015   History of hiatal hernia    Hypertension    Insomnia 12/09/2015   Migraines 12/09/2015   Osteoarthritis of both hands 12/09/2015   Osteoarthritis of right hip 12/09/2015   Mild   Patent foramen ovale    no longer sees cardiology   Retinal artery occlusion    Stroke [facility])    retinal artery   several yrs ago,  left eye diminished vision     Family History  Problem Relation Age of Onset   Hypertension Mother    Diabetes Mother    Arthritis Mother    Hypertension Brother    Cancer  Other    Heart disease Other    Past Surgical History:  Procedure Laterality Date   ABDOMINAL HYSTERECTOMY     appendectomy/ colon   ANKLE SURGERY     at age 63   APPENDECTOMY     CHOLECYSTECTOMY N/A 05/21/2014   Procedure: LAPAROSCOPIC CHOLECYSTECTOMY WITH INTRAOPERATIVE CHOLANGIOGRAM;  Surgeon: Manus Rudd, MD;  Location: [facility] OR;  Service: General;  Laterality: N/A;   COLON SURGERY     removed 5 in colon  during hysterectmy [place] due to mass   COLONOSCOPY     x 2   KNEE ARTHROPLASTY     KNEE SURGERY     TOE SURGERY     VENTRAL HERNIA REPAIR  09/01/2014   with mesh   VENTRAL HERNIA REPAIR N/A 09/01/2014   Procedure: LAPAROSCOPIC VENTRAL HERNIA REPAIR WITH MESH;  Surgeon: Manus Rudd, MD;  Location: [facility] OR;  Service: General;  Laterality: N/A;   Social History   Social History Narrative   Not on file   Immunization History  Administered Date(s) Administered   Hep A / Hep B 03/25/2021, 04/22/2021   Hepatitis A, Adult 03/23/2017   Hepatitis B 06/05/1997, 07/17/1997   Influenza,inj,Quad PF,6+ Mos 01/10/2016, 02/05/2017, 12/05/2018, 10/14/2020   Influenza,inj,Quad PF,6-35 Mos 11/08/2017, 10/07/2019   Influenza-Unspecified 11/08/2017, 12/05/2018   PFIZER(Purple Top)SARS-COV-2 Vaccination 04/05/2019, 04/26/2019, 11/21/2019   Pfizer Covid-19 Vaccine Bivalent Booster 53yrs & up 11/12/2020   Pneumococcal Polysaccharide-23 01/10/2016   Td 05/30/2001   Tdap 01/06/2009, 03/13/2019   Typhoid Inactivated 03/23/2017   Zoster, Live 03/13/2019, 10/31/2019     Objective: Vital Signs: LMP 12/31/2011    Physical Exam   Musculoskeletal Exam: ***  CDAI Exam: CDAI Score: -- Patient Global: --; Provider Global: -- Swollen: --; Tender: -- Joint Exam 11/08/2021   No joint exam has been documented for this visit   There is currently no information documented on the homunculus. Go to the Rheumatology activity and complete the homunculus joint exam.  Investigation: No additional findings.  Imaging: No results found.  Recent Labs: Lab Results  Component Value Date   WBC 8.4 06/09/2016   HGB 12.9 06/09/2016   PLT 320 06/09/2016   NA 141 06/09/2016   K 4.4 06/09/2016   CL 101 06/09/2016   CO2 25 06/09/2016   GLUCOSE 88 06/09/2016   BUN 14 06/09/2016   CREATININE 0.88 06/09/2016   BILITOT 0.4 06/09/2016   ALKPHOS 75 06/09/2016   AST 16 06/09/2016   ALT 14 06/09/2016   PROT  6.9 06/09/2016   ALBUMIN 4.3 06/09/2016   CALCIUM 9.3 06/09/2016   GFRAA 89 06/09/2016    Speciality Comments: No specialty comments available.  Procedures:  No procedures performed Allergies: Erythromycin, Propofol, Sulfa antibiotics, Levofloxacin, Fexofenadine-pseudoephed er, Hydrocodone, Hydrocodone-acetaminophen, Penicillin g sodium, Sulfamethoxazole, Azithromycin, Penicillins, and Vicodin [hydrocodone-acetaminophen]   Assessment / Plan:     Visit Diagnoses: No diagnosis found.  Orders: No orders of the defined types were placed in this encounter.  No orders of the defined types were placed in this encounter.   Face-to-face time spent with patient was *** minutes. Greater than 50% of time was spent in counseling and coordination of care.  Follow-Up Instructions: No follow-ups on file.   Ellen Henri, CMA  Note - This record has been created using Animal nutritionist.  Chart creation errors have been sought, but may not always  have been located. Such creation errors do not reflect on  the standard of medical care.

## 2021-11-08 ENCOUNTER — Ambulatory Visit: Payer: Managed Care, Other (non HMO) | Attending: Rheumatology | Admitting: Rheumatology

## 2021-11-08 ENCOUNTER — Encounter: Payer: Self-pay | Admitting: Rheumatology

## 2021-11-08 VITALS — BP 137/81 | HR 68 | Resp 17 | Ht 66.5 in | Wt 237.4 lb

## 2021-11-08 DIAGNOSIS — Z8639 Personal history of other endocrine, nutritional and metabolic disease: Secondary | ICD-10-CM

## 2021-11-08 DIAGNOSIS — M62838 Other muscle spasm: Secondary | ICD-10-CM | POA: Diagnosis not present

## 2021-11-08 DIAGNOSIS — M19041 Primary osteoarthritis, right hand: Secondary | ICD-10-CM

## 2021-11-08 DIAGNOSIS — Z8669 Personal history of other diseases of the nervous system and sense organs: Secondary | ICD-10-CM

## 2021-11-08 DIAGNOSIS — M7711 Lateral epicondylitis, right elbow: Secondary | ICD-10-CM | POA: Diagnosis not present

## 2021-11-08 DIAGNOSIS — Z87898 Personal history of other specified conditions: Secondary | ICD-10-CM

## 2021-11-08 DIAGNOSIS — Z8679 Personal history of other diseases of the circulatory system: Secondary | ICD-10-CM

## 2021-11-08 DIAGNOSIS — Z9049 Acquired absence of other specified parts of digestive tract: Secondary | ICD-10-CM

## 2021-11-08 DIAGNOSIS — M797 Fibromyalgia: Secondary | ICD-10-CM

## 2021-11-08 DIAGNOSIS — M7062 Trochanteric bursitis, left hip: Secondary | ICD-10-CM

## 2021-11-08 DIAGNOSIS — M19042 Primary osteoarthritis, left hand: Secondary | ICD-10-CM

## 2021-11-08 DIAGNOSIS — Z8719 Personal history of other diseases of the digestive system: Secondary | ICD-10-CM

## 2021-11-08 DIAGNOSIS — M7712 Lateral epicondylitis, left elbow: Secondary | ICD-10-CM

## 2021-11-08 DIAGNOSIS — M1611 Unilateral primary osteoarthritis, right hip: Secondary | ICD-10-CM

## 2021-11-08 DIAGNOSIS — M7061 Trochanteric bursitis, right hip: Secondary | ICD-10-CM

## 2021-11-08 DIAGNOSIS — Z8659 Personal history of other mental and behavioral disorders: Secondary | ICD-10-CM

## 2021-11-08 DIAGNOSIS — M545 Low back pain, unspecified: Secondary | ICD-10-CM

## 2021-11-08 DIAGNOSIS — G8929 Other chronic pain: Secondary | ICD-10-CM

## 2021-11-08 MED ORDER — METAXALONE 800 MG PO TABS: 800.0000 mg | ORAL_TABLET | Freq: Two times a day (BID) | ORAL | 2 refills | Status: DC | PRN

## 2022-01-11 ENCOUNTER — Ambulatory Visit: Payer: Managed Care, Other (non HMO) | Admitting: Physician Assistant

## 2022-02-27 NOTE — Patient Instructions (Incomplete)
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  - Based on symptoms I do not think epipen is indicated at this time as symptoms are not consistent with anaphylaxis, perhaps systemic contact dermatitis with PEG  -Continue loratadine 10mg  daily for preventative use due to risk of accidental PEG exposure  -Continue to avoid PEG containing products  Follow up:  months  or sooner if needed

## 2022-02-28 ENCOUNTER — Ambulatory Visit: Payer: Managed Care, Other (non HMO) | Admitting: Family

## 2022-02-28 ENCOUNTER — Encounter: Payer: Self-pay | Admitting: Family

## 2022-02-28 VITALS — BP 130/84 | HR 85 | Temp 98.4°F | Resp 18 | Wt 234.5 lb

## 2022-02-28 DIAGNOSIS — T7840XD Allergy, unspecified, subsequent encounter: Secondary | ICD-10-CM | POA: Diagnosis not present

## 2022-02-28 DIAGNOSIS — T783XXD Angioneurotic edema, subsequent encounter: Secondary | ICD-10-CM

## 2022-02-28 DIAGNOSIS — L253 Unspecified contact dermatitis due to other chemical products: Secondary | ICD-10-CM | POA: Diagnosis not present

## 2022-02-28 DIAGNOSIS — L501 Idiopathic urticaria: Secondary | ICD-10-CM

## 2022-02-28 DIAGNOSIS — J3089 Other allergic rhinitis: Secondary | ICD-10-CM

## 2022-02-28 DIAGNOSIS — R899 Unspecified abnormal finding in specimens from other organs, systems and tissues: Secondary | ICD-10-CM

## 2022-02-28 NOTE — Progress Notes (Signed)
[address] [place] [address] Dept: [phone]  FOLLOW UP NOTE  Patient ID: Charlotte Fox, female    DOB: 1965-04-24  Age: 57 y.o. MRN: [id number] Date of Office Visit: 02/28/2022  Assessment  Chief Complaint: No chief complaint on file.  HPI Charlotte Fox is a 57 year old female who presents today for an acute visit of allergic reaction.  She was last seen on August 26, 2021 by Dr. Marlynn Perking for PEG allergy.  She denies any new diagnosis or surgery since her last office visit.    She reports that she had an allergic reaction the first full weekend of January.  She was out of work the week of January 8.  She reports her whole face became red, swollen, itchy, and hot.  She does have a photo on her phone.  During that time she started Claritin twice a day for approximately a week and a little into the next week.  She stopped Claritin because she felt like it was drying out her sinuses.  The symptoms went away the next weekend, but she mentions he can still see where the hives were previously.  She feels pretty sure that this reaction was caused by cinnamon rolls from [place].  She mentions that polysorbate 80 is an ingredient in the cinnamon rolls.  The second reaction occurred on Monday, January 22.  She had the same facial redness, swelling, hot sensation, and itching of the face.  This time it was also impacting her breathing.  The day of the reaction she had a biscuit from [employer] with eggs for breakfast and she had a chicken sandwich from [employer] for lunch.  The reaction started a couple hours after eating [employer]..  After looking for ingredients in her food from [employer] the only thing she could see an issue with was polysorbate 80.  She did not go to urgent care until the next day on January 23.  She reports that she was given a shot that she is not sure what it was and prednisone.  She did not eat that much that day because she was afraid to eat.  She did eat fruit that day  though.  Day 3 she did take her omeprazole, telmisartan, and fluoxetine.  She has had problems with getting on all her medications due to the PEG allergy.   On day 4 her symptoms got worse after lunch.She ate [employer] again with a biscuit and scrambled eggs.  She then later on had chicken fried rice frozen from a grocery store and diet [employer].  She does have an EpiPen, but is not certain how to use it.  She does plan on going on a cruise in weeks and is going to tell the chef and the waiter/waitress at her table of her PEG allergy.  She can still see the redness on her face when she is not wearing her make-up.  She does not feel like the biscuit with egg from [employer] is the problem because she had that this morning without any problems.  Whenever she has these reactions some point in time during the reaction she has a low-grade fever of 99.9 Fahrenheit.  She did receive the influenza vaccine for 4 to 5 months ago from [facility] and reports it was [employer].  Also, in December she got Novavax COVID-19 vaccine.  She did not have any problems with either of these vaccines.  She also mentions with in the past 6 months she has had polysorbate in ice  cream and brownies without any problems.   Drug Allergies:  Allergies  Allergen Reactions   Erythromycin Itching    Other reaction(s): itching   Propofol Other (See Comments)    Respirations dropped really low, and Pt woke up during procedure   Sulfa Antibiotics Anaphylaxis and Rash   Levofloxacin Nausea And Vomiting and Other (See Comments)    Migraine.  Other reaction(s): body aches/headache   Fexofenadine-Pseudoephed Er     Other reaction(s): feels bad, sycope   Hydrocodone     Other reaction(s): vomiting   Hydrocodone-Acetaminophen Nausea And Vomiting   Penicillin G Sodium Hives   Sulfamethoxazole     Other reaction(s): anaphylaxis   Azithromycin Other (See Comments)    Stomach cramps Other reaction(s): stomach upset   Penicillins Hives    Polyethylene Glycol Rash and Swelling   Vicodin [Hydrocodone-Acetaminophen] Nausea And Vomiting    Review of Systems: Review of Systems  Constitutional:  Positive for fever.       Reports a low-grade fever of 99.9 when she has a reaction  HENT:         Reports her usual rhinorrhea, nasal congestion, postnasal drip  Eyes:        Denies itchy watery eyes, but mentions that when she had the reaction that she had cracks on her eyelids that were itchy.  Respiratory:  Negative for cough, shortness of breath and wheezing.        He reports during the second reaction that it was impacting her breathing along with the facial swelling, redness, itching, and hot sensation  Cardiovascular:  Negative for chest pain and palpitations.  Gastrointestinal:        Denies heartburn or reflux symptoms.  She does mention she has not had her omeprazole today.  Genitourinary:  Negative for frequency.  Skin:  Positive for itching and rash.       Reports 2 episodes of red swollen hot, itchy face  Neurological:  Negative for headaches.     Physical Exam: BP 130/84 (BP Location: Right Arm, Patient Position: Sitting, Cuff Size: Large)   Pulse 85   Temp 98.4 F (36.9 C) (Temporal)   Resp 18   Wt 234 lb 8 oz (106.4 kg)   LMP 12/31/2011   SpO2 97%   BMI 37.28 kg/m    Physical Exam Constitutional:      Appearance: Normal appearance.  HENT:     Head: Normocephalic and atraumatic.     Comments: Pharynx normal, eyes normal, ears normal, nose normal    Right Ear: Tympanic membrane, ear canal and external ear normal.     Left Ear: Tympanic membrane, ear canal and external ear normal.     Nose: Nose normal.     Mouth/Throat:     Mouth: Mucous membranes are moist.     Pharynx: Oropharynx is clear.  Eyes:     Conjunctiva/sclera: Conjunctivae normal.  Cardiovascular:     Rate and Rhythm: Normal rate and regular rhythm.     Heart sounds: Normal heart sounds.  Pulmonary:     Effort: Pulmonary effort is  normal.     Breath sounds: Normal breath sounds.     Comments: Lungs clear to auscultation Abdominal:     General: Abdomen is flat. Bowel sounds are normal.  Musculoskeletal:     Cervical back: Neck supple.  Skin:    General: Skin is warm.     Comments: Slight erythema noted on cheeks  Neurological:     Mental Status:  She is alert and oriented to person, place, and time.  Psychiatric:        Mood and Affect: Mood normal.        Behavior: Behavior normal.        Thought Content: Thought content normal.        Judgment: Judgment normal.     Diagnostics:  None  Assessment and Plan: 1. Allergic reaction, subsequent encounter   2. Angioedema, subsequent encounter   3. Idiopathic urticaria   4. Other allergic rhinitis   5. Contact dermatitis due to chemicals     No orders of the defined types were placed in this encounter.   Patient Instructions   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   -Start  loratadine 10mg  daily for preventative use due to risk of accidental PEG exposure  -Continue to avoid PEG containing products   Allergic reaction- unknown cause -If your symptoms re-occur, begin a journal of events that occurred for up to 6 hours before your symptoms began including foods and beverages consumed, soaps or perfumes you had contact with, and medications.   - We will get some lab work looking for the cause of your reactions. These labs can take up to 2 weeks to return. We will call you with results once they are all back. -Discussed that I do not think that polysorbate 80 is the cause of your reactions due to being able to tolerate your influenza vaccine and your Novavax COVID-19 vaccine.  Also, she is able to eat ice cream that contain polysorbate without any problems - Reviewed how to use EpiPen and when to use EpiPen - Emergency Action Plan given - Start Claritin (loratadine) 10 mg once  a day to twice a day. If you notice it is drying  your sinuses out make sure that you drink more water - Take pictures if a reaction should occur again - Make sure to let the chef and the waiter/waitress of your table, while you are on your cruise, of your PEG allergy  Schedule a follow up appointment in 2-3 weeks or sooner with Dr. Marlynn Perking.   Return in about 2 weeks (around 03/14/2022), or if symptoms worsen or fail to improve.    Thank you for the opportunity to care for this patient.  Please do not hesitate to contact me with questions.  Nehemiah Settle, FNP [facility]

## 2022-03-06 NOTE — Progress Notes (Unsigned)
Office Visit Note  Patient: Charlotte Fox             Date of Birth: [birth year]           MRN: [id number]             PCP: Cari Caraway, MD Referring: Cari Caraway, MD Visit Date: 03/07/2022 Occupation: @GUAROCC @  Subjective:  No chief complaint on file.   History of Present Illness: Charlotte Fox is a 57 y.o. female ***     Activities of Daily Living:  Patient reports morning stiffness for *** {minute/hour:19697}.   Patient {ACTIONS;DENIES/REPORTS:21021675::"Denies"} nocturnal pain.  Difficulty dressing/grooming: {ACTIONS;DENIES/REPORTS:21021675::"Denies"} Difficulty climbing stairs: {ACTIONS;DENIES/REPORTS:21021675::"Denies"} Difficulty getting out of chair: {ACTIONS;DENIES/REPORTS:21021675::"Denies"} Difficulty using hands for taps, buttons, cutlery, and/or writing: {ACTIONS;DENIES/REPORTS:21021675::"Denies"}  No Rheumatology ROS completed.   PMFS History:  Patient Active Problem List   Diagnosis Date Noted   Allergic rhinitis due to pollen 08/19/2021   Anemia 08/19/2021   Anxiety 08/19/2021   Cerebral infarction ([place]) 08/19/2021   Cough variant asthma 08/19/2021   Functional dyspepsia 08/19/2021   Migraine without aura, not intractable, without status migrainosus 08/19/2021   Mild intermittent asthma 08/19/2021   Obstructive sleep apnea (adult) (pediatric) 08/19/2021   Personal history of colonic polyps 08/19/2021   Surgical menopause 08/19/2021   Vitamin D deficiency 08/19/2021   Hyperlipidemia, unspecified 08/19/2021   Hypertriglyceridemia 08/19/2021   Travel advice encounter 03/23/2017   Class 3 severe obesity due to excess calories without serious comorbidity with body mass index (BMI) of 40.0 to 44.9 in adult [facility]) 12/13/2016   History of bowel resection 12/13/2016   History of hypertension 12/13/2016   History of depression 12/13/2016   History of ankle surgery 12/12/2016   Fatigue 12/10/2015   Fibromyalgia 12/09/2015   Osteoarthritis of  both hands 12/09/2015   Osteoarthritis of right hip 12/09/2015   Hypertension 12/09/2015   Elevated cholesterol 12/09/2015   Migraines 12/09/2015   Insomnia 12/09/2015   History of endometriosis 12/09/2015   Ventral hernia without obstruction or gangrene 09/01/2014   Mood disorder ([place]) 10/21/2013   GERD (gastroesophageal reflux disease) 10/21/2013   Nausea with vomiting 10/21/2013   Hypokalemia 10/21/2013   Cellulitis of right leg 10/20/2013   Cellulitis of right foot 10/20/2013   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   Optic neuropathy 10/09/2012   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    Past Medical History:  Diagnosis Date   Acid reflux    Anemia    Arthritis    Asthma    Complication of anesthesia    propofol dropped resp during colonoscopy, woke up during procedure   Depression    Elevated cholesterol 12/09/2015   Fibromyalgia    Headache    hx migraines   Heart murmur    does not give her any problems   High cholesterol    History of endometriosis 12/09/2015   History of hiatal hernia    Hypertension    Insomnia 12/09/2015   Migraines 12/09/2015   Osteoarthritis of both hands 12/09/2015   Osteoarthritis of right hip 12/09/2015   Mild   Patent foramen ovale    no longer sees cardiology   Retinal artery occlusion    Stroke [facility])    retinal artery   several yrs ago,  left eye diminished vision     Family History  Problem Relation Age of Onset   Hypertension Mother    Diabetes Mother    Arthritis Mother    Hypertension Brother  Cancer Other    Heart disease Other    Past Surgical History:  Procedure Laterality Date   ABDOMINAL HYSTERECTOMY     appendectomy/ colon   ANKLE SURGERY     at age 49   APPENDECTOMY     CHOLECYSTECTOMY N/A 05/21/2014   Procedure: LAPAROSCOPIC CHOLECYSTECTOMY WITH INTRAOPERATIVE CHOLANGIOGRAM;  Surgeon: Donnie Mesa, MD;  Location: [facility];  Service: General;  Laterality: N/A;   COLON SURGERY     removed 5 in  colon during hysterectmy [place] due to mass   COLONOSCOPY     x 2   KNEE ARTHROPLASTY     KNEE SURGERY     TOE SURGERY     VENTRAL HERNIA REPAIR  09/01/2014   with mesh   VENTRAL HERNIA REPAIR N/A 09/01/2014   Procedure: LAPAROSCOPIC VENTRAL HERNIA REPAIR WITH MESH;  Surgeon: Donnie Mesa, MD;  Location: [facility];  Service: General;  Laterality: N/A;   Social History   Social History Narrative   Not on file   Immunization History  Administered Date(s) Administered   Hep A / Hep B 03/25/2021, 04/22/2021   Hepatitis A, Adult 03/23/2017   Hepatitis B 06/05/1997, 07/17/1997   Influenza,inj,Quad PF,6+ Mos 01/10/2016, 02/05/2017, 12/05/2018, 10/14/2020   Influenza,inj,Quad PF,6-35 Mos 11/08/2017, 10/07/2019   Influenza-Unspecified 11/08/2017, 12/05/2018   PFIZER(Purple Top)SARS-COV-2 Vaccination 04/05/2019, 04/26/2019, 11/21/2019   Pfizer Covid-19 Vaccine Bivalent Booster 34yrs & up 11/12/2020   Pneumococcal Polysaccharide-23 01/10/2016   Td 05/30/2001   Tdap 01/06/2009, 03/13/2019   Typhoid Inactivated 03/23/2017   Zoster, Live 03/13/2019, 10/31/2019     Objective: Vital Signs: LMP 12/31/2011    Physical Exam   Musculoskeletal Exam: ***  CDAI Exam: CDAI Score: -- Patient Global: --; Provider Global: -- Swollen: --; Tender: -- Joint Exam 03/07/2022   No joint exam has been documented for this visit   There is currently no information documented on the homunculus. Go to the Rheumatology activity and complete the homunculus joint exam.  Investigation: No additional findings.  Imaging: No results found.  Recent Labs: Lab Results  Component Value Date   WBC 10.1 02/28/2022   HGB 13.4 02/28/2022   PLT 320 06/09/2016   NA 140 02/28/2022   K 4.3 02/28/2022   CL 102 02/28/2022   CO2 24 02/28/2022   GLUCOSE 89 02/28/2022   BUN 18 02/28/2022   CREATININE 1.07 (H) 02/28/2022   BILITOT 0.4 02/28/2022   ALKPHOS 75 02/28/2022   AST 15 02/28/2022   ALT 13  02/28/2022   PROT 6.2 02/28/2022   ALBUMIN 4.1 02/28/2022   CALCIUM 9.1 02/28/2022   GFRAA 89 06/09/2016    Speciality Comments: Polyethylene glycol allergy-patient cannot take methocarbamol.  Procedures:  No procedures performed Allergies: Erythromycin, Propofol, Sulfa antibiotics, Levofloxacin, Fexofenadine-pseudoephed er, Hydrocodone, Hydrocodone-acetaminophen, Penicillin g sodium, Sulfamethoxazole, Azithromycin, Penicillins, Polyethylene glycol, and Vicodin [hydrocodone-acetaminophen]   Assessment / Plan:     Visit Diagnoses: No diagnosis found.  Orders: No orders of the defined types were placed in this encounter.  No orders of the defined types were placed in this encounter.   Face-to-face time spent with patient was *** minutes. Greater than 50% of time was spent in counseling and coordination of care.  Follow-Up Instructions: No follow-ups on file.   Earnestine Mealing, CMA  Note - This record has been created using Editor, commissioning.  Chart creation errors have been sought, but may not always  have been located. Such creation errors do not reflect on  the standard  of medical care.

## 2022-03-07 ENCOUNTER — Ambulatory Visit: Payer: Managed Care, Other (non HMO) | Attending: Rheumatology | Admitting: Rheumatology

## 2022-03-07 ENCOUNTER — Encounter: Payer: Self-pay | Admitting: Rheumatology

## 2022-03-07 VITALS — BP 132/66 | HR 90 | Ht 66.5 in | Wt 237.0 lb

## 2022-03-07 DIAGNOSIS — M62838 Other muscle spasm: Secondary | ICD-10-CM

## 2022-03-07 DIAGNOSIS — M7711 Lateral epicondylitis, right elbow: Secondary | ICD-10-CM

## 2022-03-07 DIAGNOSIS — Z9049 Acquired absence of other specified parts of digestive tract: Secondary | ICD-10-CM

## 2022-03-07 DIAGNOSIS — Z8669 Personal history of other diseases of the nervous system and sense organs: Secondary | ICD-10-CM

## 2022-03-07 DIAGNOSIS — M1611 Unilateral primary osteoarthritis, right hip: Secondary | ICD-10-CM

## 2022-03-07 DIAGNOSIS — M545 Low back pain, unspecified: Secondary | ICD-10-CM

## 2022-03-07 DIAGNOSIS — M19041 Primary osteoarthritis, right hand: Secondary | ICD-10-CM | POA: Diagnosis not present

## 2022-03-07 DIAGNOSIS — M7061 Trochanteric bursitis, right hip: Secondary | ICD-10-CM

## 2022-03-07 DIAGNOSIS — Z8639 Personal history of other endocrine, nutritional and metabolic disease: Secondary | ICD-10-CM

## 2022-03-07 DIAGNOSIS — Z8659 Personal history of other mental and behavioral disorders: Secondary | ICD-10-CM

## 2022-03-07 DIAGNOSIS — Z8719 Personal history of other diseases of the digestive system: Secondary | ICD-10-CM

## 2022-03-07 DIAGNOSIS — Z8679 Personal history of other diseases of the circulatory system: Secondary | ICD-10-CM

## 2022-03-07 DIAGNOSIS — M797 Fibromyalgia: Secondary | ICD-10-CM | POA: Diagnosis not present

## 2022-03-07 DIAGNOSIS — R768 Other specified abnormal immunological findings in serum: Secondary | ICD-10-CM | POA: Diagnosis not present

## 2022-03-07 DIAGNOSIS — R21 Rash and other nonspecific skin eruption: Secondary | ICD-10-CM

## 2022-03-07 DIAGNOSIS — M7062 Trochanteric bursitis, left hip: Secondary | ICD-10-CM

## 2022-03-07 DIAGNOSIS — M7712 Lateral epicondylitis, left elbow: Secondary | ICD-10-CM

## 2022-03-07 DIAGNOSIS — G8929 Other chronic pain: Secondary | ICD-10-CM

## 2022-03-07 DIAGNOSIS — Z87898 Personal history of other specified conditions: Secondary | ICD-10-CM

## 2022-03-07 DIAGNOSIS — M19042 Primary osteoarthritis, left hand: Secondary | ICD-10-CM

## 2022-03-08 LAB — TRYPTASE: Tryptase: 7.8 ug/L (ref 2.2–13.2)

## 2022-03-08 LAB — ANA W/REFLEX: Anti Nuclear Antibody (ANA): POSITIVE — AB

## 2022-03-08 LAB — ENA+DNA/DS+SJORGEN'S
ENA RNP Ab: <0.2 AI (ref 0.0–0.9)
ENA SM Ab Ser-aCnc: <0.2 AI (ref 0.0–0.9)
ENA SSA (RO) Ab: <0.2 AI (ref 0.0–0.9)
ENA SSB (LA) Ab: 5 AI — ABNORMAL HIGH (ref 0.0–0.9)
dsDNA Ab: <1 IU/mL (ref 0–9)

## 2022-03-08 LAB — CBC WITH DIFFERENTIAL
Basophils Absolute: 0 10*3/uL (ref 0.0–0.2)
Basos: 0 %
EOS (ABSOLUTE): 0.2 10*3/uL (ref 0.0–0.4)
Eos: 2 %
Hematocrit: 40.7 % (ref 34.0–46.6)
Hemoglobin: 13.4 g/dL (ref 11.1–15.9)
Immature Grans (Abs): 0.1 10*3/uL (ref 0.0–0.1)
Immature Granulocytes: 1 %
Lymphocytes Absolute: 2.4 10*3/uL (ref 0.7–3.1)
Lymphs: 24 %
MCH: 28.3 pg (ref 26.6–33.0)
MCHC: 32.9 g/dL (ref 31.5–35.7)
MCV: 86 fL (ref 79–97)
Monocytes Absolute: 0.6 10*3/uL (ref 0.1–0.9)
Monocytes: 6 %
Neutrophils Absolute: 6.9 10*3/uL (ref 1.4–7.0)
Neutrophils: 67 %
RBC: 4.74 x10E6/uL (ref 3.77–5.28)
RDW: 12.8 % (ref 11.7–15.4)
WBC: 10.1 10*3/uL (ref 3.4–10.8)

## 2022-03-08 LAB — COMPREHENSIVE METABOLIC PANEL
ALT: 13 IU/L (ref 0–32)
AST: 15 IU/L (ref 0–40)
Albumin/Globulin Ratio: 2 (ref 1.2–2.2)
Albumin: 4.1 g/dL (ref 3.8–4.9)
Alkaline Phosphatase: 75 IU/L (ref 44–121)
BUN/Creatinine Ratio: 17 (ref 9–23)
BUN: 18 mg/dL (ref 6–24)
Bilirubin Total: 0.4 mg/dL (ref 0.0–1.2)
CO2: 24 mmol/L (ref 20–29)
Calcium: 9.1 mg/dL (ref 8.7–10.2)
Chloride: 102 mmol/L (ref 96–106)
Creatinine, Ser: 1.07 mg/dL — ABNORMAL HIGH (ref 0.57–1.00)
Globulin, Total: 2.1 g/dL (ref 1.5–4.5)
Glucose: 89 mg/dL (ref 70–99)
Potassium: 4.3 mmol/L (ref 3.5–5.2)
Sodium: 140 mmol/L (ref 134–144)
Total Protein: 6.2 g/dL (ref 6.0–8.5)
eGFR: 61 mL/min/{1.73_m2} (ref >59)

## 2022-03-08 LAB — ALPHA-GAL PANEL
Allergen Lamb IgE: <0.1 kU/L
Beef IgE: <0.1 kU/L
IgE (Immunoglobulin E), Serum: 107 IU/mL (ref 6–495)
O215-IgE Alpha-Gal: <0.1 kU/L
Pork IgE: <0.1 kU/L

## 2022-03-08 LAB — C1 ESTERASE INHIBITOR: C1INH SerPl-mCnc: 50 mg/dL — ABNORMAL HIGH (ref 21–39)

## 2022-03-08 LAB — CHRONIC URTICARIA: cu index: 6.1 (ref <10)

## 2022-03-08 LAB — THYROGLOBULIN ANTIBODY: Thyroglobulin Antibody: <1 IU/mL (ref 0.0–0.9)

## 2022-03-08 LAB — C4 COMPLEMENT: Complement C4, Serum: 27 mg/dL (ref 12–38)

## 2022-03-08 LAB — SEDIMENTATION RATE: Sed Rate: 28 mm/hr (ref 0–40)

## 2022-03-08 LAB — C1 ESTERASE INHIBITOR, FUNCTIONAL: C1INH Functional/C1INH Total MFr SerPl: >93 %mean normal

## 2022-03-08 LAB — COMPLEMENT COMPONENT C1Q: Complement C1Q: 9.7 mg/dL — ABNORMAL LOW (ref 10.3–20.5)

## 2022-03-08 LAB — THYROID PEROXIDASE ANTIBODY: Thyroperoxidase Ab SerPl-aCnc: 13 IU/mL (ref 0–34)

## 2022-03-10 NOTE — Addendum Note (Signed)
Addended by: Althea Charon on: 03/10/2022 01:41 PM   Modules accepted: Orders

## 2022-03-10 NOTE — Progress Notes (Signed)
Please let Amali know that we received her lab results.  ESR: normal . This is an inflammatory marker.  Alpha gal: normal. This rules out red meat allergy.  C4: normal  Thyroid peroxidase antibody and thyroglobulin antibody: normal  Tryptase: normal. This rules out mast cell disease.  Chronic urticaria index: normal  CBC with diff : normal.   ANA: came back positive. It looks like she has already seen Rheumatology  on 03/07/22 for this. Make sure she continues to follow up with them.  C1 esterase inhibitor: elevated. We are more concerned if it is low.  C1q: this is slightly decreased. We can repeat this at your next office visit.  C1 esterase  inhibitor functional: normal.  Comprehensive metabolic panel: your creatinine level was elevated a little.   Please make sure to keep your follow up appointment with Dr. Edison Pace on 03/21/22 @ 3 PM.

## 2022-03-11 LAB — CARDIOLIPIN ANTIBODIES, IGG, IGM, IGA
Anticardiolipin IgA: <9 APL U/mL (ref 0–11)
Anticardiolipin IgG: <9 GPL U/mL (ref 0–14)
Anticardiolipin IgM: <9 MPL U/mL (ref 0–12)

## 2022-03-11 LAB — C3 AND C4
Complement C3, Serum: 189 mg/dL — ABNORMAL HIGH (ref 82–167)
Complement C4, Serum: 33 mg/dL (ref 12–38)

## 2022-03-11 LAB — BETA-2-GLYCOPROTEIN I ABS, IGG/M/A
Beta-2 Glyco 1 IgA: <9 GPI IgA units (ref 0–25)
Beta-2 Glyco 1 IgM: <9 GPI IgM units (ref 0–32)
Beta-2 Glyco I IgG: <9 GPI IgG units (ref 0–20)

## 2022-03-11 LAB — PROTEIN / CREATININE RATIO, URINE
Creatinine, Urine: 148.1 mg/dL
Protein, Ur: 13.3 mg/dL
Protein/Creat Ratio: 90 mg/g creat (ref 0–200)

## 2022-03-11 LAB — ANA: Anti Nuclear Antibody (ANA): POSITIVE — AB

## 2022-03-12 NOTE — Progress Notes (Signed)
ANA is positive, titer is not available.  Complements are in desirable range.  Urine protein creatinine ratio is normal, anticardiolipin negative, beta-2 GP 1 negative.

## 2022-03-14 ENCOUNTER — Encounter: Payer: Self-pay | Admitting: *Deleted

## 2022-03-21 ENCOUNTER — Ambulatory Visit: Payer: Managed Care, Other (non HMO) | Admitting: Internal Medicine

## 2022-03-21 ENCOUNTER — Encounter: Payer: Self-pay | Admitting: Internal Medicine

## 2022-03-21 ENCOUNTER — Other Ambulatory Visit: Payer: Self-pay

## 2022-03-21 VITALS — BP 114/70 | HR 76 | Temp 98.6°F | Resp 18 | Wt 240.0 lb

## 2022-03-21 DIAGNOSIS — T783XXD Angioneurotic edema, subsequent encounter: Secondary | ICD-10-CM | POA: Diagnosis not present

## 2022-03-21 DIAGNOSIS — L253 Unspecified contact dermatitis due to other chemical products: Secondary | ICD-10-CM | POA: Diagnosis not present

## 2022-03-21 DIAGNOSIS — T7840XD Allergy, unspecified, subsequent encounter: Secondary | ICD-10-CM

## 2022-03-21 NOTE — Progress Notes (Unsigned)
Follow Up Note  RE: Charlotte Fox MRN: [id number] DOB: 09/12/1965 Date of Office Visit: 03/21/2022  Referring provider: Cari Caraway, MD Primary care provider: Cari Caraway, MD  Chief Complaint: Follow-up (Lab results)  History of Present Illness: I had the pleasure of seeing [name] for a follow up visit at the [facility] on 03/23/2022. She is a 57 y.o. female, who is being followed for PEG allergy . Her previous allergy office visit was on 02/28/22 with Althea Charon FNP. Today is a regular follow up visit.  History obtained from patient, chart review. At last visit she reported that she was having a flare of her PEG allergy which she attributed to using soap in her office which contained PEG.  She was also concerned polysorbate 80 contributing to symptoms.  Flares manifested as erythematous rash on her face sometimes associated with swelling.  Angioedema labs were done which were negative.  C1q was slightly low and plan was to repeat it today for follow-up.  Today she reports that symptoms are well-controlled.  She still strictly avoids PEG and all forms including in food.  She has an app which helps her navigate food and drug which contain PEG.  She still wants to avoid polysorbate 80.  She did have a positive ANA and has been referred to rheumatology for further evaluation.  Their current plan was to repeat serology and go from there.  She does not feel like Claritin helped prevent her symptoms at all.  Symptoms usually occur a day or 2 after PEG exposure.    Assessment and Plan: [name] is a 57 y.o. female with: Angioedema, subsequent encounter - Plan: Complement component c1q  Allergic reaction, subsequent encounter  Contact dermatitis due to chemicals   Plan: Patient Instructions   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   -Continue to avoid PEG containing products - Information sheets provided today   - Given symptoms sounds more consistent with delayed type reactions agree with home prednisone prescription which you should start at onset of red, dry flakely rashes - Follow up with dermatology as planned    Allergic reaction- unknown cause - Angioedema (swelling) and mast cell (spontaneous allergic reaction) testing was normal   -C1q was slightly low, we will repeat today  - Continue to follow with rheumatology for positive ANA  - Continue to carry  EpiPen for now    Schedule a follow up appointment  3-6 months (after dermatology and rheumatology evaluation    Thank you so much for letting me partake in your care today.  Don't hesitate to reach out if you have any additional concerns!  Roney Marion, MD  [facility] [place] [place]    No orders of the defined types were placed in this encounter.   Lab Orders         Complement component c1q     Diagnostics: None done   Medication List:  Current Outpatient Medications  Medication Sig Dispense Refill   cholecalciferol (VITAMIN D) 1000 UNITS tablet Take 4,000 Units by mouth daily.     EPINEPHrine 0.3 mg/0.3 mL IJ SOAJ injection SMARTSIG:0.3 Milliliter(s) IM Once PRN     estradiol (CLIMARA - DOSED IN MG/24 HR) 0.05 mg/24hr patch Place 0.05 mg onto the skin once a week. Change patch every Tuesday.     FLUoxetine (PROZAC) 20 MG capsule Take 20 mg by mouth daily.     loratadine (  CLARITIN) 10 MG tablet Take 10 mg by mouth daily as needed for allergies.     Misc Natural Products (TART CHERRY ADVANCED PO) Take 1 capsule by mouth daily at 12 noon.     mometasone-formoterol (DULERA) 100-5 MCG/ACT AERO Inhale 2 puffs as needed into the lungs for wheezing.     omeprazole (PRILOSEC) 20 MG capsule Take 20 mg by mouth daily.     PROAIR RESPICLICK 123XX123 (90 Base) MCG/ACT AEPB INHALE 2 PUFFS BY MOUTH EVERY 4 6 HOURS AS NEEDED     promethazine (PHENERGAN) 25 MG tablet Take 25 mg by mouth every 6 (six) hours as needed for  nausea or vomiting.     telmisartan (MICARDIS) 80 MG tablet Take 80 mg by mouth daily.     tiZANidine (ZANAFLEX) 4 MG tablet Take 1 tablet (4 mg total) by mouth at bedtime as needed for muscle spasms. 30 tablet 2   rosuvastatin (CRESTOR) 40 MG tablet Take 40 mg by mouth daily. (Patient not taking: Reported on 03/07/2022)     No current facility-administered medications for this visit.   Allergies: Allergies  Allergen Reactions   Erythromycin Itching    Other reaction(s): itching   Propofol Other (See Comments)    Respirations dropped really low, and Pt woke up during procedure   Sulfa Antibiotics Anaphylaxis and Rash   Levofloxacin Nausea And Vomiting and Other (See Comments)    Migraine.  Other reaction(s): body aches/headache   Fexofenadine-Pseudoephed Er     Other reaction(s): feels bad, sycope   Hydrocodone     Other reaction(s): vomiting   Hydrocodone-Acetaminophen Nausea And Vomiting   Penicillin G Sodium Hives   Sulfamethoxazole     Other reaction(s): anaphylaxis   Azithromycin Other (See Comments)    Stomach cramps Other reaction(s): stomach upset   Penicillins Hives   Polyethylene Glycol Rash and Swelling   Vicodin [Hydrocodone-Acetaminophen] Nausea And Vomiting   I reviewed her past medical history, social history, family history, and environmental history and no significant changes have been reported from her previous visit.  ROS: All others negative except as noted per HPI.   Objective: BP 114/70 (BP Location: Left Arm, Patient Position: Sitting, Cuff Size: Normal)   Pulse 76   Temp 98.6 F (37 C) (Temporal)   Resp 18   Wt 240 lb (108.9 kg)   LMP 12/31/2011   SpO2 98%   BMI 38.16 kg/m  Body mass index is 38.16 kg/m. General Appearance:  Alert, cooperative, no distress, appears stated age  Head:  Normocephalic, without obvious abnormality, atraumatic  Eyes:  Conjunctiva clear, EOM's intact  Nose: Nares normal,   Throat: Lips, tongue normal; teeth and  gums normal,   Neck: Supple, symmetrical  Lungs:   , Respirations unlabored, no coughing  Heart:  , Appears well perfused  Extremities: No edema  Skin: Skin color, texture, turgor normal, no rashes or lesions on visualized portions of skin  Neurologic: No gross deficits   Previous notes and tests were reviewed. The plan was reviewed with the patient/family, and all questions/concerned were addressed.  It was my pleasure to see Charlotte Fox today and participate in her care. Please feel free to contact me with any questions or concerns.  Sincerely,  Roney Marion, MD  Allergy & Immunology  [facility] Office: [phone]

## 2022-03-21 NOTE — Patient Instructions (Addendum)
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   -Start  loratadine 70m daily for preventative use due to risk of accidental PEG exposure  -Continue to avoid PEG containing products - Information sheets provided today  - Given symptoms sounds more consistent with delayed type reactions agree with home prednisone prescription which you should start at onset of red, dry flakely rashes - Follow up with dermatology as planned    Allergic reaction- unknown cause - Angioedema (swelling) and mast cell (spontaneous allergic reaction) testing was normal   -C1q was slightly low, we will repeat today  - Continue to follow with rheumatology for positive ANA  - Continue to carry  EpiPen for now    Schedule a follow up appointment  3-6 months (after dermatology and rheumatology evaluation    Thank you so much for letting me partake in your care today.  Don't hesitate to reach out if you have any additional concerns!  ERoney Marion MD  [facility] [place]

## 2022-03-23 NOTE — Progress Notes (Unsigned)
Office Visit Note  Patient: Charlotte Fox             Date of Birth: March 24, 1965           MRN: [id number]             PCP: Cari Caraway, MD Referring: Cari Caraway, MD Visit Date: 04/05/2022 Occupation: '@GUAROCC'$ @  Subjective:  Arthralgias   History of Present Illness: Charlotte Fox is a 57 y.o. female with history of positive ANA and osteoarthritis.  Patient reports that she continues to have generalized arthralgias and joint stiffness.  She has been taking meloxicam 15 mg 1 tablet daily as needed for pain relief along with tart cherry for the anti-inflammatory properties.  She reports that she has been having increased discomfort in her lower back recently.  She states that she ran out of her prescription for tizanidine which is typically helpful in alleviating her discomfort.  Patient requested a refill of tizanidine to be sent to the pharmacy.  Patient reports that her discomfort due to trochanteric bursitis has improved since performing stretching exercises daily.   She continues to have dry eyes, dry skin, and nose dryness.  She denies any oral or nasal ulcers.  She denies any symptoms of Raynaud's phenomenon.  She has not noticed any hair loss.  She continues to have intermittent rashes on her face and extremities.  She is still awaiting an appointment with dermatology.     Activities of Daily Living:  Patient reports morning stiffness for 30 minutes.   Patient Reports nocturnal pain.  Difficulty dressing/grooming: Denies Difficulty climbing stairs: Denies Difficulty getting out of chair: Denies Difficulty using hands for taps, buttons, cutlery, and/or writing: Reports  Review of Systems  Constitutional:  Positive for fatigue.  HENT: Negative.  Negative for mouth sores and mouth dryness.   Eyes:  Positive for dryness.  Respiratory: Negative.  Negative for shortness of breath.   Cardiovascular: Negative.  Negative for chest pain and palpitations.  Gastrointestinal:   Positive for constipation and diarrhea. Negative for blood in stool.  Endocrine: Negative.  Negative for increased urination.  Genitourinary: Negative.  Negative for involuntary urination.  Musculoskeletal:  Positive for joint pain, joint pain, joint swelling, myalgias, muscle weakness, morning stiffness, muscle tenderness and myalgias. Negative for gait problem.  Skin: Negative.  Negative for color change, rash, hair loss and sensitivity to sunlight.  Allergic/Immunologic: Negative.  Negative for susceptible to infections.  Neurological:  Positive for headaches. Negative for dizziness.  Hematological: Negative.  Negative for swollen glands.  Psychiatric/Behavioral:  Positive for depressed mood and sleep disturbance. The patient is not nervous/anxious.     PMFS History:  Patient Active Problem List   Diagnosis Date Noted   Allergic rhinitis due to pollen 08/19/2021   Anemia 08/19/2021   Anxiety 08/19/2021   Cerebral infarction ([place]) 08/19/2021   Cough variant asthma 08/19/2021   Functional dyspepsia 08/19/2021   Migraine without aura, not intractable, without status migrainosus 08/19/2021   Mild intermittent asthma 08/19/2021   Obstructive sleep apnea (adult) (pediatric) 08/19/2021   Personal history of colonic polyps 08/19/2021   Surgical menopause 08/19/2021   Vitamin D deficiency 08/19/2021   Hyperlipidemia, unspecified 08/19/2021   Hypertriglyceridemia 08/19/2021   Travel advice encounter 03/23/2017   Class 3 severe obesity due to excess calories without serious comorbidity with body mass index (BMI) of 40.0 to 44.9 in adult [facility]) 12/13/2016   History of bowel resection 12/13/2016   History of hypertension 12/13/2016  History of depression 12/13/2016   History of ankle surgery 12/12/2016   Fatigue 12/10/2015   Fibromyalgia 12/09/2015   Osteoarthritis of both hands 12/09/2015   Osteoarthritis of right hip 12/09/2015   Hypertension 12/09/2015   Elevated cholesterol  12/09/2015   Migraines 12/09/2015   Insomnia 12/09/2015   History of endometriosis 12/09/2015   Ventral hernia without obstruction or gangrene 09/01/2014   Mood disorder ([place]) 10/21/2013   GERD (gastroesophageal reflux disease) 10/21/2013   Nausea with vomiting 10/21/2013   Hypokalemia 10/21/2013   Cellulitis of right leg 10/20/2013   Cellulitis of right foot 10/20/2013   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   Optic neuropathy 10/09/2012   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    Past Medical History:  Diagnosis Date   Acid reflux    Anemia    Arthritis    Asthma    Complication of anesthesia    propofol dropped resp during colonoscopy, woke up during procedure   Depression    Elevated cholesterol 12/09/2015   Fibromyalgia    Headache    hx migraines   Heart murmur    does not give her any problems   High cholesterol    History of endometriosis 12/09/2015   History of hiatal hernia    Hypertension    Insomnia 12/09/2015   Migraines 12/09/2015   Osteoarthritis of both hands 12/09/2015   Osteoarthritis of right hip 12/09/2015   Mild   Patent foramen ovale    no longer sees cardiology   Retinal artery occlusion    Stroke [facility])    retinal artery   several yrs ago,  left eye diminished vision     Family History  Problem Relation Age of Onset   Hypertension Mother    Diabetes Mother    Arthritis Mother    Hypertension Brother    Cancer Other    Heart disease Other    Past Surgical History:  Procedure Laterality Date   ABDOMINAL HYSTERECTOMY     appendectomy/ colon   ANKLE SURGERY     at age 89   APPENDECTOMY     CHOLECYSTECTOMY N/A 05/21/2014   Procedure: LAPAROSCOPIC CHOLECYSTECTOMY WITH INTRAOPERATIVE CHOLANGIOGRAM;  Surgeon: Donnie Mesa, MD;  Location: [place];  Service: General;  Laterality: N/A;   COLON SURGERY     removed 5 in colon during hysterectmy [place] due to mass   COLONOSCOPY     x 2   KNEE ARTHROPLASTY     KNEE SURGERY      TOE SURGERY     VENTRAL HERNIA REPAIR  09/01/2014   with mesh   VENTRAL HERNIA REPAIR N/A 09/01/2014   Procedure: LAPAROSCOPIC VENTRAL HERNIA REPAIR WITH MESH;  Surgeon: Donnie Mesa, MD;  Location: [place];  Service: General;  Laterality: N/A;   Social History   Social History Narrative   Not on file   Immunization History  Administered Date(s) Administered   Hep A / Hep B 03/25/2021, 04/22/2021   Hepatitis A, Adult 03/23/2017   Hepatitis B 06/05/1997, 07/17/1997   Influenza,inj,Quad PF,6+ Mos 01/10/2016, 02/05/2017, 12/05/2018, 10/14/2020   Influenza,inj,Quad PF,6-35 Mos 11/08/2017, 10/07/2019   Influenza-Unspecified 11/08/2017, 12/05/2018   PFIZER(Purple Top)SARS-COV-2 Vaccination 04/05/2019, 04/26/2019, 11/21/2019   Pfizer Covid-19 Vaccine Bivalent Booster 73yr & up 11/12/2020   Pneumococcal Polysaccharide-23 01/10/2016   Td 05/30/2001   Tdap 01/06/2009, 03/13/2019   Typhoid Inactivated 03/23/2017   Zoster, Live 03/13/2019, 10/31/2019     Objective: Vital Signs: BP 125/77 (BP Location: Left Arm,  Patient Position: Sitting, Cuff Size: Large)   Pulse 79   Resp 16   Ht 5' 6.5" (1.689 m)   Wt 237 lb (107.5 kg)   LMP 12/31/2011   BMI 37.68 kg/m    Physical Exam Vitals and nursing note reviewed.  Constitutional:      Appearance: She is well-developed.  HENT:     Head: Normocephalic and atraumatic.  Eyes:     Conjunctiva/sclera: Conjunctivae normal.  Cardiovascular:     Rate and Rhythm: Normal rate and regular rhythm.     Heart sounds: Normal heart sounds.  Pulmonary:     Effort: Pulmonary effort is normal.     Breath sounds: Normal breath sounds.  Abdominal:     General: Bowel sounds are normal.     Palpations: Abdomen is soft.  Musculoskeletal:     Cervical back: Normal range of motion.  Skin:    General: Skin is warm and dry.     Capillary Refill: Capillary refill takes less than 2 seconds.     Comments: Diffuse facial erythema.   No digital ulcers or signs  of cyanosis.  Diffuse xeroderma   Neurological:     Mental Status: She is alert and oriented to person, place, and time.  Psychiatric:        Behavior: Behavior normal.      Musculoskeletal Exam: C-spine has good range of motion.  Discomfort with lumbar range of motion.  Some midline spinal tenderness in the lumbar region.  No sacroiliac joint tenderness noted at this time.  Shoulder joints have good range of motion with some crepitus in the right shoulder.  Trapezius muscle tension tenderness bilaterally.  Tenderness over the lateral epicondyle of both elbows.  No olecranon bursitis noted.  Wrist joints have good range of motion with no tenderness or synovitis.  MCPs, PIPs, DIPs have good range of motion with no synovitis.  Complete fist formation bilaterally.  Hip joints have good range of motion with no groin pain.  Mild tenderness over bilateral trochanteric bursa.  Knee joints have good range of motion with no warmth or effusion.  Ankle joints have good range of motion with no tenderness or joint swelling.  CDAI Exam: CDAI Score: -- Patient Global: --; Provider Global: -- Swollen: --; Tender: -- Joint Exam 04/05/2022   No joint exam has been documented for this visit   There is currently no information documented on the homunculus. Go to the Rheumatology activity and complete the homunculus joint exam.  Investigation: No additional findings.  Imaging: No results found.  Recent Labs: Lab Results  Component Value Date   WBC 10.1 02/28/2022   HGB 13.4 02/28/2022   PLT 320 06/09/2016   NA 140 02/28/2022   K 4.3 02/28/2022   CL 102 02/28/2022   CO2 24 02/28/2022   GLUCOSE 89 02/28/2022   BUN 18 02/28/2022   CREATININE 1.07 (H) 02/28/2022   BILITOT 0.4 02/28/2022   ALKPHOS 75 02/28/2022   AST 15 02/28/2022   ALT 13 02/28/2022   PROT 6.2 02/28/2022   ALBUMIN 4.1 02/28/2022   CALCIUM 9.1 02/28/2022   GFRAA 89 06/09/2016    Speciality Comments: Polyethylene glycol  allergy-patient cannot take methocarbamol.  Procedures:  No procedures performed Allergies: Erythromycin, Propofol, Sulfa antibiotics, Levofloxacin, Fexofenadine-pseudoephed er, Hydrocodone, Hydrocodone-acetaminophen, Penicillin g sodium, Sulfamethoxazole, Azithromycin, Penicillins, Polyethylene glycol, and Vicodin [hydrocodone-acetaminophen]     Assessment / Plan:     Visit Diagnoses: Positive ANA (antinuclear antibody) - +ANA-no titer. anti-cardiolipin ab-, beta-2  glycoprotein antibodies negative, La 5.0, Ro-, SM-, RNP-, dsDNA negative: Reviewed lab work from 02/28/2022 and 03/08/2022.  She continues to have symptoms of dry eyes and nasal dryness. Diffuse xeroderma and facial erythema noted (does not spare nasolabial folds).  She has ongoing fatigue, arthralgias, and myalgias.  No synovitis was noted on examination today.  She has been taking meloxicam 15 mg 1 tablet daily as needed and tart cherry for anti-inflammatory properties.  She has not had any symptoms of Raynaud's phenomenon.  No digital ulcerations or signs of cyanosis or gangrene were noted.  Good capillary refill less than 2 seconds noted.  No signs of alopecia noted. She does not currently meet criteria for systemic lupus.   She remains concerned by the frequency of rashes she experiences on her face and extremities.  Dr. Estanislado Pandy referred the patient to Dr. Sharol Roussel for further evaluation but she has not yet been scheduled.  We called Dr. Yancey Flemings office today to ensure they received the referral and she was scheduled for 9:45 AM tomorrow morning for an initial consultation.  Rash -ANA+, La+, Intermittent rashes x3 years-face and extremities. Polyethylene glycol sensitivity based on the patch test. Dr. Estanislado Pandy referred the patient to Dr. Sharol Roussel at her last office visit on 03/07/22.  Scheduled tomorrow at 945 am.   Fibromyalgia: She has generalized hyperalgesia and positive tender points on examination.  She continues to experience  generalized myalgias, muscle tenderness, arthralgias, and fatigue.  She had no synovitis on examination today.  She has been taking meloxicam 15 mg 1 tablet daily as needed for pain relief.  She ran out of her prescription for tizanidine so a refill was sent to the pharmacy today.  She has been performing stretching exercises which have been alleviating her discomfort from trochanteric bursitis.  She has ongoing trapezius muscle tension and tenderness bilaterally.  Discussed the importance of regular exercise and good sleep hygiene.  Trapezius muscle spasm: She has trapezius muscle tension and tenderness bilaterally.  A refill of tizanidine 4 mg 1 tablet at bedtime as needed for muscle spasms was sent to the pharmacy today.  Primary osteoarthritis of both hands: PIP and DIP thickening consistent with osteoarthritis.  No synovitis noted today.  Complete fist formation noted bilaterally.  Lateral epicondylitis of both elbows: Tenderness over the lateral epicondyle of both elbows.   Trochanteric bursitis of both hips: She has tenderness to palpation over bilateral trochanteric bursa.    Primary osteoarthritis of right hip: No groin pain.   Other medical conditions are listed as follows:   History of insomnia  History of depression: Taking prozac.   History of hypertension: BP was 125/77 today in the office.   History of hypercholesterolemia  History of gastroesophageal reflux (GERD)  History of bowel resection  History of migraine  Orders: No orders of the defined types were placed in this encounter.  Meds ordered this encounter  Medications   tiZANidine (ZANAFLEX) 4 MG tablet    Sig: Take 1 tablet (4 mg total) by mouth at bedtime as needed for muscle spasms.    Dispense:  30 tablet    Refill:  2    Follow-Up Instructions: Return in about 3 months (around 07/06/2022) for +ANA, Osteoarthritis.   Ofilia Neas, PA-C  Note - This record has been created using Dragon software.   Chart creation errors have been sought, but may not always  have been located. Such creation errors do not reflect on  the standard of medical care.

## 2022-03-29 LAB — COMPLEMENT COMPONENT C1Q: Complement C1Q: 11.7 mg/dL (ref 10.3–20.5)

## 2022-03-29 NOTE — Progress Notes (Signed)
Repeat c1q was normal.  No further work up needed for swellng at this time.  Continue to avoid PEG products.  Can someone let patient know?

## 2022-04-05 ENCOUNTER — Encounter: Payer: Self-pay | Admitting: Physician Assistant

## 2022-04-05 ENCOUNTER — Ambulatory Visit: Payer: Managed Care, Other (non HMO) | Attending: Physician Assistant | Admitting: Physician Assistant

## 2022-04-05 VITALS — BP 125/77 | HR 79 | Resp 16 | Ht 66.5 in | Wt 237.0 lb

## 2022-04-05 DIAGNOSIS — M19042 Primary osteoarthritis, left hand: Secondary | ICD-10-CM

## 2022-04-05 DIAGNOSIS — R21 Rash and other nonspecific skin eruption: Secondary | ICD-10-CM | POA: Diagnosis not present

## 2022-04-05 DIAGNOSIS — Z8719 Personal history of other diseases of the digestive system: Secondary | ICD-10-CM

## 2022-04-05 DIAGNOSIS — Z8679 Personal history of other diseases of the circulatory system: Secondary | ICD-10-CM

## 2022-04-05 DIAGNOSIS — M62838 Other muscle spasm: Secondary | ICD-10-CM

## 2022-04-05 DIAGNOSIS — M7711 Lateral epicondylitis, right elbow: Secondary | ICD-10-CM

## 2022-04-05 DIAGNOSIS — Z9049 Acquired absence of other specified parts of digestive tract: Secondary | ICD-10-CM

## 2022-04-05 DIAGNOSIS — M19041 Primary osteoarthritis, right hand: Secondary | ICD-10-CM

## 2022-04-05 DIAGNOSIS — M7062 Trochanteric bursitis, left hip: Secondary | ICD-10-CM

## 2022-04-05 DIAGNOSIS — M797 Fibromyalgia: Secondary | ICD-10-CM

## 2022-04-05 DIAGNOSIS — Z8659 Personal history of other mental and behavioral disorders: Secondary | ICD-10-CM

## 2022-04-05 DIAGNOSIS — Z8669 Personal history of other diseases of the nervous system and sense organs: Secondary | ICD-10-CM

## 2022-04-05 DIAGNOSIS — Z87898 Personal history of other specified conditions: Secondary | ICD-10-CM

## 2022-04-05 DIAGNOSIS — Z8639 Personal history of other endocrine, nutritional and metabolic disease: Secondary | ICD-10-CM

## 2022-04-05 DIAGNOSIS — M7712 Lateral epicondylitis, left elbow: Secondary | ICD-10-CM

## 2022-04-05 DIAGNOSIS — M7061 Trochanteric bursitis, right hip: Secondary | ICD-10-CM

## 2022-04-05 DIAGNOSIS — M1611 Unilateral primary osteoarthritis, right hip: Secondary | ICD-10-CM

## 2022-04-05 DIAGNOSIS — R768 Other specified abnormal immunological findings in serum: Secondary | ICD-10-CM

## 2022-04-05 MED ORDER — TIZANIDINE HCL 4 MG PO TABS: 4.0000 mg | ORAL_TABLET | Freq: Every evening | ORAL | 2 refills | Status: DC | PRN

## 2022-05-10 ENCOUNTER — Ambulatory Visit: Payer: Managed Care, Other (non HMO) | Admitting: Physician Assistant

## 2022-05-19 ENCOUNTER — Encounter: Payer: Self-pay | Admitting: Emergency Medicine

## 2022-05-19 ENCOUNTER — Ambulatory Visit
Admission: EM | Admit: 2022-05-19 | Discharge: 2022-05-19 | Disposition: A | Discharge status: Home / Self Care | Payer: Managed Care, Other (non HMO) | Attending: Internal Medicine | Admitting: Internal Medicine

## 2022-05-19 DIAGNOSIS — L02212 Cutaneous abscess of back [any part, except buttock]: Secondary | ICD-10-CM

## 2022-05-19 MED ORDER — LIDOCAINE HCL (PF) 1 % IJ SOLN: 5.0000 mL | Freq: Once | INTRAMUSCULAR | Status: DC

## 2022-05-19 MED ORDER — DOXYCYCLINE HYCLATE 100 MG PO CAPS: 100.0000 mg | ORAL_CAPSULE | Freq: Two times a day (BID) | ORAL | 0 refills | Status: AC

## 2022-05-19 NOTE — ED Provider Notes (Addendum)
[facility]    CSN: [id number] Arrival date & time: 05/19/22  1901      History   Chief Complaint Chief Complaint  Patient presents with   Abscess    HPI Charlotte Fox is a 57 y.o. female comes to urgent care with 1 to 2 weeks history of painful swelling on the right upper back.  Swelling started insidiously and has been worsening over the past several days.  Patient reports drainage from the site about a week ago.  Subsequently patient has noticed worsening swelling in the right upper back with worsening pain.  No fever or chills.  No trauma.  Right upper back is mildly red.  No fever or chills.  HPI  Past Medical History:  Diagnosis Date   Acid reflux    Anemia    Arthritis    Asthma    Complication of anesthesia    propofol dropped resp during colonoscopy, woke up during procedure   Depression    Elevated cholesterol 12/09/2015   Fibromyalgia    Headache    hx migraines   Heart murmur    does not give her any problems   High cholesterol    History of endometriosis 12/09/2015   History of hiatal hernia    Hypertension    Insomnia 12/09/2015   Migraines 12/09/2015   Osteoarthritis of both hands 12/09/2015   Osteoarthritis of right hip 12/09/2015   Mild   Patent foramen ovale    no longer sees cardiology   Retinal artery occlusion    Stroke    retinal artery   several yrs ago,  left eye diminished vision     Patient Active Problem List   Diagnosis Date Noted   Allergic rhinitis due to pollen 08/19/2021   Anemia 08/19/2021   Anxiety 08/19/2021   Cerebral infarction 08/19/2021   Cough variant asthma 08/19/2021   Functional dyspepsia 08/19/2021   Migraine without aura, not intractable, without status migrainosus 08/19/2021   Mild intermittent asthma 08/19/2021   Obstructive sleep apnea (adult) (pediatric) 08/19/2021   Personal history of colonic polyps 08/19/2021   Surgical menopause 08/19/2021   Vitamin D deficiency 08/19/2021    Hyperlipidemia, unspecified 08/19/2021   Hypertriglyceridemia 08/19/2021   Travel advice encounter 03/23/2017   Class 3 severe obesity due to excess calories without serious comorbidity with body mass index (BMI) of 40.0 to 44.9 in adult 12/13/2016   History of bowel resection 12/13/2016   History of hypertension 12/13/2016   History of depression 12/13/2016   History of ankle surgery 12/12/2016   Fatigue 12/10/2015   Fibromyalgia 12/09/2015   Osteoarthritis of both hands 12/09/2015   Osteoarthritis of right hip 12/09/2015   Hypertension 12/09/2015   Elevated cholesterol 12/09/2015   Migraines 12/09/2015   Insomnia 12/09/2015   History of endometriosis 12/09/2015   Ventral hernia without obstruction or gangrene 09/01/2014   Mood disorder 10/21/2013   GERD (gastroesophageal reflux disease) 10/21/2013   Nausea with vomiting 10/21/2013   Hypokalemia 10/21/2013   Cellulitis of right leg 10/20/2013   Cellulitis of right foot 10/20/2013   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   Optic neuropathy 10/09/2012   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    Past Surgical History:  Procedure Laterality Date   ABDOMINAL HYSTERECTOMY     appendectomy/ colon   ANKLE SURGERY     at age 57   APPENDECTOMY     CHOLECYSTECTOMY N/A 05/21/2014   Procedure: LAPAROSCOPIC CHOLECYSTECTOMY WITH INTRAOPERATIVE CHOLANGIOGRAM;  Surgeon:  Manus Rudd, MD;  Location: [facility] OR;  Service: General;  Laterality: N/A;   COLON SURGERY     removed 5 in colon during hysterectmy [place] due to mass   COLONOSCOPY     x 2   KNEE ARTHROPLASTY     KNEE SURGERY     TOE SURGERY     VENTRAL HERNIA REPAIR  09/01/2014   with mesh   VENTRAL HERNIA REPAIR N/A 09/01/2014   Procedure: LAPAROSCOPIC VENTRAL HERNIA REPAIR WITH MESH;  Surgeon: Manus Rudd, MD;  Location: [facility] OR;  Service: General;  Laterality: N/A;    OB History   No obstetric history on file.      Home Medications    Prior to Admission  medications   Medication Sig Start Date End Date Taking? Authorizing Provider  doxycycline (VIBRAMYCIN) 100 MG capsule Take 1 capsule (100 mg total) by mouth 2 (two) times daily for 7 days. 05/19/22 05/26/22 Yes Sister Carbone, Britta Mccreedy, MD  predniSONE (DELTASONE) 10 MG tablet Take 10 mg by mouth daily. 05/04/22  Yes [provider]  cholecalciferol (VITAMIN D) 1000 UNITS tablet Take 4,000 Units by mouth daily.    [provider]  EPINEPHrine 0.3 mg/0.3 mL IJ SOAJ injection SMARTSIG:0.3 Milliliter(s) IM Once PRN 08/11/21   [provider]  estradiol (CLIMARA - DOSED IN MG/24 HR) 0.05 mg/24hr patch Place 0.05 mg onto the skin once a week. Change patch every Tuesday.    [provider]  FLUoxetine (PROZAC) 20 MG capsule Take 20 mg by mouth daily.    [provider]  loratadine (CLARITIN) 10 MG tablet Take 10 mg by mouth daily as needed for allergies.    [provider]  meloxicam (MOBIC) 15 MG tablet Take 15 mg by mouth daily.    [provider]  Misc Natural Products (TART CHERRY ADVANCED PO) Take 1 capsule by mouth daily at 12 noon.    [provider]  mometasone-formoterol (DULERA) 100-5 MCG/ACT AERO Inhale 2 puffs as needed into the lungs for wheezing.    [provider]  omeprazole (PRILOSEC) 20 MG capsule Take 20 mg by mouth daily.    [provider]  PROAIR RESPICLICK 108 (90 Base) MCG/ACT AEPB INHALE 2 PUFFS BY MOUTH EVERY 4 6 HOURS AS NEEDED 04/16/18   [provider]  promethazine (PHENERGAN) 25 MG tablet Take 25 mg by mouth every 6 (six) hours as needed for nausea or vomiting.    [provider]  rosuvastatin (CRESTOR) 40 MG tablet Take 40 mg by mouth daily. Patient not taking: Reported on 03/07/2022    [provider]  telmisartan (MICARDIS) 80 MG tablet Take 80 mg by mouth daily.    [provider]  tiZANidine (ZANAFLEX) 4 MG tablet Take 1 tablet (4 mg total) by mouth at bedtime  as needed for muscle spasms. 04/05/22   Gearldine Bienenstock, PA-C    Family History Family History  Problem Relation Age of Onset   Hypertension Mother    Diabetes Mother    Arthritis Mother    Hypertension Brother    Cancer Other    Heart disease Other     Social History Social History   Tobacco Use   Smoking status: Never    Passive exposure: Current (out in public)   Smokeless tobacco: Never  Vaping Use   Vaping Use: Never used  Substance Use Topics   Alcohol use: Yes    Comment: once yearly   Drug use: No  Allergies   Erythromycin, Propofol, Sulfa antibiotics, Levofloxacin, Fexofenadine-pseudoephed er, Hydrocodone, Hydrocodone-acetaminophen, Penicillin g sodium, Sulfamethoxazole, Azithromycin, Penicillins, Polyethylene glycol, and Vicodin [hydrocodone-acetaminophen]   Review of Systems Review of Systems  Gastrointestinal: Negative.   Musculoskeletal: Negative.   Skin:  Positive for color change. Negative for wound.     Physical Exam Triage Vital Signs ED Triage Vitals  Enc Vitals Group     BP 05/19/22 1914 132/85     Pulse Rate 05/19/22 1914 91     Resp 05/19/22 1914 17     Temp 05/19/22 1914 98.5 F (36.9 C)     Temp src --      SpO2 05/19/22 1914 98 %     Weight --      Height --      Head Circumference --      Peak Flow --      Pain Score 05/19/22 1913 4     Pain Loc --      Pain Edu? --      Excl. in GC? --    No data found.  Updated Vital Signs BP 132/85 (BP Location: Right Arm)   Pulse 91   Temp 98.5 F (36.9 C)   Resp 17   LMP 12/31/2011   SpO2 98%   Visual Acuity Right Eye Distance:   Left Eye Distance:   Bilateral Distance:    Right Eye Near:   Left Eye Near:    Bilateral Near:     Physical Exam Vitals and nursing note reviewed.  Constitutional:      General: She is not in acute distress.    Appearance: She is not ill-appearing.  Cardiovascular:     Rate and Rhythm: Normal rate and regular rhythm.     Pulses: Normal  pulses.     Heart sounds: Normal heart sounds.  Musculoskeletal:        General: Normal range of motion.  Skin:    General: Skin is warm.     Findings: Erythema present.     Comments: Tender fluctuant swelling over the right upper back.  Fluctuant area measures about 2 inches in the longest diameter.  Area of induration measures about 4 inches in the longest diameter.  Mild erythema.  No discharge noted.  Neurological:     Mental Status: She is alert.      UC Treatments / Results  Labs (all labs ordered are listed, but only abnormal results are displayed) Labs Reviewed - No data to display  EKG   Radiology No results found.  Procedures Incision and Drainage  Date/Time: 05/19/2022 8:03 PM  Performed by: Merrilee Jansky, MD Authorized by: Merrilee Jansky, MD   Consent:    Consent obtained:  Verbal   Consent given by:  Patient   Risks, benefits, and alternatives were discussed: yes     Risks discussed:  Bleeding and incomplete drainage   Alternatives discussed:  Delayed treatment and no treatment Universal protocol:    Patient identity confirmed:  Verbally with patient Location:    Type:  Abscess   Location:  Trunk   Trunk location:  Back Pre-procedure details:    Skin preparation:  Povidone-iodine Sedation:    Sedation type:  None Anesthesia:    Anesthesia method:  Local infiltration   Local anesthetic:  Lidocaine 1% w/o epi Procedure type:    Complexity:  Simple Procedure details:    Incision types:  Single straight   Incision depth:  Subcutaneous   Wound management:  Probed and deloculated   Drainage:  Bloody and purulent   Drainage amount:  Moderate   Wound treatment:  Wound left open   Packing materials:  None Post-procedure details:    Procedure completion:  Tolerated well, no immediate complications  (including critical care time)  Medications Ordered in UC Medications - No data to display  Initial Impression / Assessment and Plan / UC Course   I have reviewed the triage vital signs and the nursing notes.  Pertinent labs & imaging results that were available during my care of the patient were reviewed by me and considered in my medical decision making (see chart for details).     1.  Abscess of the right upper back: Incision and drainage completed Moderate amount of pus drained Ibuprofen as needed for pain Doxycycline 100 mg twice daily for 7 days Wound care instructions given to the patient Please return to urgent care if symptoms worsen Final Clinical Impressions(s) / UC Diagnoses   Final diagnoses:  Cutaneous abscess of back excluding buttocks     Discharge Instructions      Daily wound dressing changes Wash the area with mild soap and water.  Make sure that you wash the soap off completely Please take antibiotics as prescribed Please complete the course of antibiotics You may take ibuprofen as needed for pain Please take antibiotics with a glass of water If you have worsening pain, increased discharge, fever, worsening swelling-please return to urgent care to be reevaluated.   ED Prescriptions     Medication Sig Dispense Auth. Provider   doxycycline (VIBRAMYCIN) 100 MG capsule Take 1 capsule (100 mg total) by mouth 2 (two) times daily for 7 days. 14 capsule Jude Linck, Britta Mccreedy, MD      PDMP not reviewed this encounter.   Merrilee Jansky, MD 05/19/22 Gweneth Fritter    Merrilee Jansky, MD 05/19/22 2004

## 2022-05-19 NOTE — ED Triage Notes (Signed)
Pt c/o knot on upper right back for couple of weeks. Reports drainage week ago but pain and swelling gotten worse.

## 2022-05-19 NOTE — Discharge Instructions (Addendum)
Daily wound dressing changes Wash the area with mild soap and water.  Make sure that you wash the soap off completely Please take antibiotics as prescribed Please complete the course of antibiotics You may take ibuprofen as needed for pain Please take antibiotics with a glass of water If you have worsening pain, increased discharge, fever, worsening swelling-please return to urgent care to be reevaluated.

## 2022-06-20 ENCOUNTER — Ambulatory Visit: Payer: Managed Care, Other (non HMO) | Admitting: Internal Medicine

## 2022-06-20 VITALS — BP 118/68 | HR 88 | Temp 98.3°F | Resp 12

## 2022-06-20 DIAGNOSIS — T783XXD Angioneurotic edema, subsequent encounter: Secondary | ICD-10-CM | POA: Diagnosis not present

## 2022-06-20 DIAGNOSIS — L253 Unspecified contact dermatitis due to other chemical products: Secondary | ICD-10-CM

## 2022-06-20 NOTE — Patient Instructions (Addendum)
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   -Continue to avoid PEG containing products - Information sheets provided today  - Given symptoms sounds more consistent with delayed type reactions agree with home prednisone prescription which you should start at onset of red, dry flakely rashes  Atopic dermatitis  - Angioedema (swelling) and mast cell (spontaneous allergic reaction) testing was normal  - Based on more recent pictures and response to prednisone would call this atopic dermatitis  - Information given on JAK inhibitors and dupixent   Let us know if you are interested in starting either of these medications   Thank you so much for letting me partake in your care today.  Don't hesitate to reach out if you have any additional concerns!  Ferol Luz, MD  [facility] [place] [place]

## 2022-06-20 NOTE — Progress Notes (Unsigned)
Follow Up Note  RE: Charlotte Fox MRN: [id number] DOB: [birth year] Date of Office Visit: 06/20/2022  Referring provider: Gweneth Dimitri, MD Primary care provider: Gweneth Dimitri, MD  Chief Complaint: Rash  History of Present Illness: I had the pleasure of seeing Charlotte Fox for a follow up visit at the [facility] on 06/21/2022. She is a 57 y.o. female, who is being followed for PEG allergy . Her previous allergy office visit was on 03/21/22 with Dr. Marlynn Fox. Today is a regular follow up visit.  History obtained from patient, chart review. Since last visit she has been seen by rheumatology and dermatology.  No clear diagnosis at this time  Derm believed it was an allergic contact dermatitis.  Rheumatology noted she does not meet criteria for lupus, but wanted dermatology input.  Dermatologist diagnosed it as allergic contact dermatits, recommendations below.   Since then she has had 3 flares of symptoms since last visit which manifest as erythematous facial dermatitis which does respond to prednisone  She is wary of using topicals due to PEG containment.  She is unsure is she was exposed to PEG prior to 3 flares.  She may be interested in dupixent or rinvoq for treatment given difficulty avoiding.   Derm note 04/06/22 " PLAN: Extensive counseling was provided to the patient regarding the diagnosis and treatment options. Her allergist discuss option of patch test to rule out other allergens She will contact us with active rash. Advise to journal before and after new episode. Continue to avoid PEG products and taking loratadine Side effects and appropriate use of all the medication(s) were discussed with the patient today. Risks, benefits, and alternatives were discussed. Patient advised to use the medication(s) as directed by their healthcare provider. The patient was encouraged to read, review, and understand all associated package inserts and contact our office with  any questions or concerns "  Assessment and Plan: [name] is a 57 y.o. 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   -Continue to avoid PEG containing products - Information sheets provided today  - Given symptoms sounds more consistent with delayed type reactions agree with home prednisone prescription which you should start at onset of red, dry flakely rashes  Atopic dermatitis  - Angioedema (swelling) and mast cell (spontaneous allergic reaction) testing was normal  - Based on more recent pictures and response to prednisone would call this atopic dermatitis  - Information given on JAK inhibitors and dupixent   Let us know if you are interested in starting either of these medications   Thank you so much for letting me partake in your care today.  Don't hesitate to reach out if you have any additional concerns!  Charlotte Luz, MD  [facility] [place] [place]   No orders of the defined types were placed in this encounter.   Lab Orders  No laboratory test(s) ordered today    Diagnostics: None done   Medication List:  Current Outpatient Medications  Medication Sig Dispense Refill   cholecalciferol (VITAMIN D) 1000 UNITS tablet Take 4,000 Units by mouth daily.     EPINEPHrine 0.3 mg/0.3 mL IJ SOAJ injection SMARTSIG:0.3 Milliliter(s) IM Once PRN     estradiol (CLIMARA - DOSED IN MG/24 HR) 0.05 mg/24hr patch Place 0.05 mg onto the skin once a week. Change patch every  Tuesday.     FLUoxetine (PROZAC) 20 MG capsule Take 20 mg by mouth daily.     loratadine (CLARITIN) 10 MG tablet Take 10 mg by mouth daily as needed for allergies.     meloxicam (MOBIC) 15 MG tablet Take 15 mg by mouth daily.     Misc Natural Products (TART CHERRY ADVANCED PO) Take 1 capsule by mouth daily at 12 noon.     mometasone-formoterol  (DULERA) 100-5 MCG/ACT AERO Inhale 2 puffs as needed into the lungs for wheezing.     montelukast (SINGULAIR) 10 MG tablet Take by mouth.     NURTEC 75 MG TBDP Take 1 tablet by mouth daily as needed.     pravastatin (PRAVACHOL) 40 MG tablet Take 40 mg by mouth daily.     PROAIR RESPICLICK 108 (90 Base) MCG/ACT AEPB INHALE 2 PUFFS BY MOUTH EVERY 4 6 HOURS AS NEEDED     promethazine (PHENERGAN) 25 MG tablet Take 25 mg by mouth every 6 (six) hours as needed for nausea or vomiting.     telmisartan (MICARDIS) 80 MG tablet Take 80 mg by mouth daily.     tiZANidine (ZANAFLEX) 4 MG tablet Take 1 tablet (4 mg total) by mouth at bedtime as needed for muscle spasms. 30 tablet 2   omeprazole (PRILOSEC) 20 MG capsule Take 20 mg by mouth daily. (Patient not taking: Reported on 06/20/2022)     predniSONE (DELTASONE) 10 MG tablet Take 10 mg by mouth daily. (Patient not taking: Reported on 06/20/2022)     No current facility-administered medications for this visit.   Allergies: Allergies  Allergen Reactions   Erythromycin Itching    Other reaction(s): itching   Propofol Other (See Comments)    Respirations dropped really low, and Pt woke up during procedure   Sulfa Antibiotics Anaphylaxis and Rash   Levofloxacin Nausea And Vomiting and Other (See Comments)    Migraine.  Other reaction(s): body aches/headache   Fexofenadine-Pseudoephed Er     Other reaction(s): feels bad, sycope   Hydrocodone     Other reaction(s): vomiting   Hydrocodone-Acetaminophen Nausea And Vomiting   Penicillin G Sodium Hives   Rosuvastatin     Has PEG ingredient in it   Sulfamethoxazole     Other reaction(s): anaphylaxis   Azithromycin Other (See Comments)    Stomach cramps Other reaction(s): stomach upset   Penicillins Hives   Polyethylene Glycol Rash and Swelling   Vicodin [Hydrocodone-Acetaminophen] Nausea And Vomiting   I reviewed her past medical history, social history, family history, and environmental history and  no significant changes have been reported from her previous visit.  ROS: All others negative except as noted per HPI.   Objective: BP 118/68   Pulse 88   Temp 98.3 F (36.8 C) (Temporal)   Resp 12   LMP 12/31/2011   SpO2 98%  There is no height or weight on file to calculate BMI. General Appearance:  Alert, cooperative, no distress, appears stated age  Head:  Normocephalic, without obvious abnormality, atraumatic  Eyes:  Conjunctiva clear, EOM's intact  Nose: Nares normal,   Throat: Lips, tongue normal; teeth and gums normal,   Neck: Supple, symmetrical  Lungs:   , Respirations unlabored, no coughing  Heart:  , Appears well perfused  Extremities: No edema  Skin: Skin color, texture, turgor normal, no rashes or lesions on visualized portions of skin  Neurologic: No gross deficits   Previous notes and tests were reviewed. The plan was reviewed with  the patient/family, and all questions/concerned were addressed.  It was my pleasure to see Currie today and participate in her care. Please feel free to contact me with any questions or concerns.  Sincerely,  Charlotte Luz, MD  Allergy & Immunology  [facility] Office: [phone]

## 2022-06-28 NOTE — Progress Notes (Signed)
Office Visit Note  Patient: Charlotte Fox             Date of Birth: 07/18/1965           MRN: [id number]             PCP: Gweneth Dimitri, MD Referring: Gweneth Dimitri, MD Visit Date: 07/07/2022 Occupation: @GUAROCC @  Subjective:  Generalized pain    History of Present Illness: CYRSTAL LEITZ is a 57 y.o. female with history of positive ANA and fibromyalgia.  Patient presents today experiencing increased generalized myalgias and arthralgias.  She has been having increased discomfort in the trapezius muscles bilaterally but has also had discomfort extending down her back.  She has ongoing discomfort due to trochanter bursitis.  She has been experiencing muscle spasms intermittently.  She take zanaflex 4 mg at bedtime as needed for muscle spasms. Patient states that she has had several allergic reactions from products with PEG.  She has been under the care of of her allergist who has recommended avoidance.  She has been unable to initiate any new medications due to PEG being an active ingredient.    Activities of Daily Living:  Patient reports morning stiffness for 3 hours.   Patient Reports nocturnal pain.  Difficulty dressing/grooming: Denies Difficulty climbing stairs: Denies Difficulty getting out of chair: Denies Difficulty using hands for taps, buttons, cutlery, and/or writing: Reports  Review of Systems  Constitutional:  Positive for fatigue.  HENT:  Positive for mouth dryness and nose dryness. Negative for mouth sores.   Eyes:  Positive for visual disturbance and dryness. Negative for pain.  Respiratory:  Negative for cough, hemoptysis, shortness of breath and difficulty breathing.   Cardiovascular:  Positive for hypertension and swelling in legs/feet. Negative for chest pain and palpitations.  Gastrointestinal:  Positive for constipation and diarrhea. Negative for blood in stool.  Endocrine: Negative for increased urination.  Genitourinary:  Negative for painful  urination and involuntary urination.  Musculoskeletal:  Positive for joint pain, joint pain, joint swelling, myalgias, muscle weakness, morning stiffness, muscle tenderness and myalgias. Negative for gait problem.  Skin:  Positive for rash, nodules/bumps, skin tightness and sensitivity to sunlight. Negative for color change, pallor, hair loss and ulcers.  Allergic/Immunologic: Negative for susceptible to infections.  Neurological:  Positive for numbness, headaches and weakness. Negative for dizziness.  Hematological:  Negative for swollen glands.  Psychiatric/Behavioral:  Positive for depressed mood and sleep disturbance. The patient is not nervous/anxious.     PMFS History:  Patient Active Problem List   Diagnosis Date Noted   Allergic rhinitis due to pollen 08/19/2021   Anemia 08/19/2021   Anxiety 08/19/2021   Cerebral infarction (HCC) 08/19/2021   Cough variant asthma 08/19/2021   Functional dyspepsia 08/19/2021   Migraine without aura, not intractable, without status migrainosus 08/19/2021   Mild intermittent asthma 08/19/2021   Obstructive sleep apnea (adult) (pediatric) 08/19/2021   Personal history of colonic polyps 08/19/2021   Surgical menopause 08/19/2021   Vitamin D deficiency 08/19/2021   Hyperlipidemia, unspecified 08/19/2021   Hypertriglyceridemia 08/19/2021   Travel advice encounter 03/23/2017   Class 3 severe obesity due to excess calories without serious comorbidity with body mass index (BMI) of 40.0 to 44.9 in adult [facility]) 12/13/2016   History of bowel resection 12/13/2016   History of hypertension 12/13/2016   History of depression 12/13/2016   History of ankle surgery 12/12/2016   Fatigue 12/10/2015   Fibromyalgia 12/09/2015   Osteoarthritis of both hands 12/09/2015  Osteoarthritis of right hip 12/09/2015   Hypertension 12/09/2015   Elevated cholesterol 12/09/2015   Migraines 12/09/2015   Insomnia 12/09/2015   History of endometriosis 12/09/2015   Ventral  hernia without obstruction or gangrene 09/01/2014   Mood disorder (HCC) 10/21/2013   GERD (gastroesophageal reflux disease) 10/21/2013   Nausea with vomiting 10/21/2013   Hypokalemia 10/21/2013   Cellulitis of right leg 10/20/2013   Cellulitis of right foot 10/20/2013   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   Optic neuropathy 10/09/2012   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    Past Medical History:  Diagnosis Date   Acid reflux    Anemia    Arthritis    Asthma    Complication of anesthesia    propofol dropped resp during colonoscopy, woke up during procedure   Depression    Elevated cholesterol 12/09/2015   Fibromyalgia    Headache    hx migraines   Heart murmur    does not give her any problems   High cholesterol    History of endometriosis 12/09/2015   History of hiatal hernia    Hypertension    Insomnia 12/09/2015   Migraines 12/09/2015   Osteoarthritis of both hands 12/09/2015   Osteoarthritis of right hip 12/09/2015   Mild   Patent foramen ovale    no longer sees cardiology   Retinal artery occlusion    Stroke [facility])    retinal artery   several yrs ago,  left eye diminished vision     Family History  Problem Relation Age of Onset   Hypertension Mother    Diabetes Mother    Arthritis Mother    Hypertension Brother    Cancer Other    Heart disease Other    Past Surgical History:  Procedure Laterality Date   ABDOMINAL HYSTERECTOMY     appendectomy/ colon   ANKLE SURGERY     at age 29   APPENDECTOMY     CHOLECYSTECTOMY N/A 05/21/2014   Procedure: LAPAROSCOPIC CHOLECYSTECTOMY WITH INTRAOPERATIVE CHOLANGIOGRAM;  Surgeon: Manus Rudd, MD;  Location: [facility] OR;  Service: General;  Laterality: N/A;   COLON SURGERY     removed 5 in colon during hysterectmy [place] due to mass   COLONOSCOPY     x 2   KNEE ARTHROPLASTY     KNEE SURGERY     TOE SURGERY     VENTRAL HERNIA REPAIR  09/01/2014   with mesh   VENTRAL HERNIA REPAIR N/A 09/01/2014    Procedure: LAPAROSCOPIC VENTRAL HERNIA REPAIR WITH MESH;  Surgeon: Manus Rudd, MD;  Location: [facility] OR;  Service: General;  Laterality: N/A;   Social History   Social History Narrative   Not on file   Immunization History  Administered Date(s) Administered   Hep A / Hep B 03/25/2021, 04/22/2021   Hepatitis A, Adult 03/23/2017   Hepatitis B 06/05/1997, 07/17/1997   Influenza,inj,Quad PF,6+ Mos 01/10/2016, 02/05/2017, 12/05/2018, 10/14/2020   Influenza,inj,Quad PF,6-35 Mos 11/08/2017, 10/07/2019   Influenza-Unspecified 11/08/2017, 12/05/2018   PFIZER(Purple Top)SARS-COV-2 Vaccination 04/05/2019, 04/26/2019, 11/21/2019   Pfizer Covid-19 Vaccine Bivalent Booster 27yrs & up 11/12/2020   Pneumococcal Polysaccharide-23 01/10/2016   Td 05/30/2001   Tdap 01/06/2009, 03/13/2019   Typhoid Inactivated 03/23/2017   Zoster, Live 03/13/2019, 10/31/2019     Objective: Vital Signs: BP 135/81 (BP Location: Left Arm, Patient Position: Sitting, Cuff Size: Normal)   Pulse 80   Resp 16   Ht 5\' 6"  (1.676 m)   Wt 239 lb (108.4 kg)  LMP 12/31/2011   BMI 38.58 kg/m    Physical Exam Vitals and nursing note reviewed.  Constitutional:      Appearance: She is well-developed.  HENT:     Head: Normocephalic and atraumatic.  Eyes:     Conjunctiva/sclera: Conjunctivae normal.  Cardiovascular:     Rate and Rhythm: Normal rate and regular rhythm.     Heart sounds: Normal heart sounds.  Pulmonary:     Effort: Pulmonary effort is normal.     Breath sounds: Normal breath sounds.  Abdominal:     General: Bowel sounds are normal.     Palpations: Abdomen is soft.  Musculoskeletal:     Cervical back: Normal range of motion.  Lymphadenopathy:     Cervical: No cervical adenopathy.  Skin:    General: Skin is warm and dry.     Capillary Refill: Capillary refill takes less than 2 seconds.  Neurological:     Mental Status: She is alert and oriented to person, place, and time.  Psychiatric:         Behavior: Behavior normal.      Musculoskeletal Exam: Generalized hyperalgesia and positive tender points.  C-spine has good ROM.  Trapezius muscle tension and tenderness.  Shoulder joints, elbow joints, wrist joints, MCPs, PIPs, and DIPs good ROM with no synovitis.  Tenderness of the left elbow. Complete fist formation bilaterally.  Hip joints, knee joints, and ankle joints have good ROM with no discomfort.  No warmth or effusion of knee joints.  No tenderness or swelling of ankle joints.  Tenderness over bilateral trochanteric bursa.   CDAI Exam: CDAI Score: -- Patient Global: --; Provider Global: -- Swollen: --; Tender: -- Joint Exam 07/07/2022   No joint exam has been documented for this visit   There is currently no information documented on the homunculus. Go to the Rheumatology activity and complete the homunculus joint exam.  Investigation: No additional findings.  Imaging: No results found.  Recent Labs: Lab Results  Component Value Date   WBC 10.1 02/28/2022   HGB 13.4 02/28/2022   PLT 320 06/09/2016   NA 140 02/28/2022   K 4.3 02/28/2022   CL 102 02/28/2022   CO2 24 02/28/2022   GLUCOSE 89 02/28/2022   BUN 18 02/28/2022   CREATININE 1.07 (H) 02/28/2022   BILITOT 0.4 02/28/2022   ALKPHOS 75 02/28/2022   AST 15 02/28/2022   ALT 13 02/28/2022   PROT 6.2 02/28/2022   ALBUMIN 4.1 02/28/2022   CALCIUM 9.1 02/28/2022   GFRAA 89 06/09/2016    Speciality Comments: Polyethylene glycol allergy-patient cannot take methocarbamol.  Procedures:  No procedures performed Allergies: Erythromycin, Propofol, Sulfa antibiotics, Levofloxacin, Fexofenadine-pseudoephed er, Hydrocodone, Hydrocodone-acetaminophen, Penicillin g sodium, Rosuvastatin, Sulfamethoxazole, Azithromycin, Penicillins, Polyethylene glycol, and Vicodin [hydrocodone-acetaminophen]    Assessment / Plan:     Visit Diagnoses: Positive ANA (antinuclear antibody) - +ANA-no titer. anti-cardiolipin ab-, beta-2  glycoprotein antibodies negative, La 5.0, Ro-, SM-, RNP-, dsDNA negative:Reviewed lab work from 02/28/2022 and 03/08/2022.  Patient continues to experience chronic fatigue, myalgias, and arthralgias on a daily basis.  Patient reports sicca symptoms, sun sensitivity, and intermittent rashes.  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  She has been under the care of allergist and was found to have PEG sensitivity.  The patient has been limited on which products and medication she can take due to trying to avoid exposure. Discussed that she does not currently meet clinical criteria for systemic lupus.  She does not require immunosuppression at  this time.  No synovitis was noted on examination today.  Offered to update lab work today for further evaluation but she has declined.  Offered a referral for second opinion with another rheumatologist but she has declined.  Offered a referral to pain management but she declined.  Offered referral to physical therapy but she has declined.  She will notify us if she develops any other new or worsening symptoms.  She will follow-up in the office in 6 months or sooner if needed.  Rash - ANA+, La+, Intermittent rashes x3 years-face and extremities. Polyethylene glycol sensitivity based on the patch test.   Evaluated by Dr. Fuller Plan on 04/06/22--diagnosed with allergic dermatitis.   Fibromyalgia -She has generalized hyperalgesia and positive tender points on examination.  She has been experiencing increased generalized myalgias, arthralgias, and fatigue.  She has had increased nocturnal pain secondary to discomfort from trochanter bursitis of both hips.  She has been trying to perform daily stretching exercises with no improvement in her symptoms.  She is taking meloxicam 15 mg daily for pain relief and tizanidine 4 mg at bedtime.  Offered a referral to physical therapy including integrative therapies but she has declined.  Offered referral to pain management but she  has declined.  Offered a prescription for methocarbamol but she is unable to take methocarbamol due to PEG allergy.  Trapezius muscle spasm: She presents today with trapezius muscle tension and tenderness bilaterally.  She is been experiencing muscle spasms intermittently.  She takes tizanidine 4 mg at bedtime for muscle spasms.  She is unable to take tizanidine during the day due to excessive drowsiness.  Patient requested a muscle relaxer that can be taken during the day.  She was unable to take methocarbamol due to an allergy to PEG. Offered trapezius trigger point injections today but she is unable to use triamcinolone due to polysorbate as an ingredient.   Primary osteoarthritis of both hands: No synovitis was noted on examination today.  She has complete fist formation bilaterally.  She continues to take tart cherry on a daily basis for the natural anti-inflammatory properties.  She is also taking meloxicam 15 mg daily for pain relief.  Lateral epicondylitis of both elbows: Ongoing tenderness over both elbows.  Trochanteric bursitis of both hips: She has been experiencing increased nocturnal pain due to trochanter bursitis in both hips.  She has been having to change positions frequently throughout the night for relief.  She has been trying to perform daily stretching exercises with no improvement in her symptoms.  Offered a referral to integrative therapies or another physical therapy office but she has declined at this time.  Offered cortisone injections but she is unable to take triamcinolone due to the preservatives included.  Offered referral to pain management but she has declined.  Encouraged patient to continue home exercises.  Primary osteoarthritis of right hip: She has good range of motion of the right hip joint with no groin pain.  She has tenderness over bilateral trochanteric bursa.  History of insomnia: She takes tizanidine 4 mg at bedtime for muscle spasms and insomnia.  Other  medical conditions are listed as follows:  History of depression: She remains on Prozac as prescribed.  History of hypertension: Blood pressure was 135/81 today in the office.  History of hypercholesterolemia  History of gastroesophageal reflux (GERD)  History of bowel resection  History of migraine: She has been experiencing more frequent migraines and has had to take Nurtec more frequently for management.  Orders:  No orders of the defined types were placed in this encounter.  No orders of the defined types were placed in this encounter.     Follow-Up Instructions: Return in about 6 months (around 01/06/2023) for +ANA, Fibromyalgia.   Gearldine Bienenstock, PA-C  Note - This record has been created using Dragon software.  Chart creation errors have been sought, but may not always  have been located. Such creation errors do not reflect on  the standard of medical care.

## 2022-07-07 ENCOUNTER — Ambulatory Visit: Payer: Managed Care, Other (non HMO) | Attending: Physician Assistant | Admitting: Physician Assistant

## 2022-07-07 ENCOUNTER — Encounter: Payer: Self-pay | Admitting: Physician Assistant

## 2022-07-07 VITALS — BP 135/81 | HR 80 | Resp 16 | Ht 66.0 in | Wt 239.0 lb

## 2022-07-07 DIAGNOSIS — R21 Rash and other nonspecific skin eruption: Secondary | ICD-10-CM | POA: Diagnosis not present

## 2022-07-07 DIAGNOSIS — Z8659 Personal history of other mental and behavioral disorders: Secondary | ICD-10-CM

## 2022-07-07 DIAGNOSIS — M19042 Primary osteoarthritis, left hand: Secondary | ICD-10-CM

## 2022-07-07 DIAGNOSIS — M62838 Other muscle spasm: Secondary | ICD-10-CM | POA: Diagnosis not present

## 2022-07-07 DIAGNOSIS — Z8679 Personal history of other diseases of the circulatory system: Secondary | ICD-10-CM

## 2022-07-07 DIAGNOSIS — R768 Other specified abnormal immunological findings in serum: Secondary | ICD-10-CM | POA: Diagnosis not present

## 2022-07-07 DIAGNOSIS — Z8669 Personal history of other diseases of the nervous system and sense organs: Secondary | ICD-10-CM

## 2022-07-07 DIAGNOSIS — M1611 Unilateral primary osteoarthritis, right hip: Secondary | ICD-10-CM

## 2022-07-07 DIAGNOSIS — M7711 Lateral epicondylitis, right elbow: Secondary | ICD-10-CM

## 2022-07-07 DIAGNOSIS — M797 Fibromyalgia: Secondary | ICD-10-CM | POA: Diagnosis not present

## 2022-07-07 DIAGNOSIS — M7061 Trochanteric bursitis, right hip: Secondary | ICD-10-CM

## 2022-07-07 DIAGNOSIS — Z9049 Acquired absence of other specified parts of digestive tract: Secondary | ICD-10-CM

## 2022-07-07 DIAGNOSIS — M19041 Primary osteoarthritis, right hand: Secondary | ICD-10-CM

## 2022-07-07 DIAGNOSIS — M7712 Lateral epicondylitis, left elbow: Secondary | ICD-10-CM

## 2022-07-07 DIAGNOSIS — Z87898 Personal history of other specified conditions: Secondary | ICD-10-CM

## 2022-07-07 DIAGNOSIS — Z8719 Personal history of other diseases of the digestive system: Secondary | ICD-10-CM

## 2022-07-07 DIAGNOSIS — M7062 Trochanteric bursitis, left hip: Secondary | ICD-10-CM

## 2022-07-07 DIAGNOSIS — Z8639 Personal history of other endocrine, nutritional and metabolic disease: Secondary | ICD-10-CM

## 2022-07-07 NOTE — Patient Instructions (Signed)
Baclofen  

## 2022-07-31 ENCOUNTER — Other Ambulatory Visit: Payer: Self-pay | Admitting: Family Medicine

## 2022-07-31 DIAGNOSIS — Z1231 Encounter for screening mammogram for malignant neoplasm of breast: Secondary | ICD-10-CM

## 2022-08-02 ENCOUNTER — Ambulatory Visit
Admission: RE | Admit: 2022-08-02 | Discharge: 2022-08-02 | Discharge status: Home / Self Care | Payer: Managed Care, Other (non HMO) | Source: Ambulatory Visit | Attending: Family Medicine | Admitting: Family Medicine

## 2022-08-02 DIAGNOSIS — Z1231 Encounter for screening mammogram for malignant neoplasm of breast: Secondary | ICD-10-CM

## 2022-10-11 ENCOUNTER — Other Ambulatory Visit (HOSPITAL_BASED_OUTPATIENT_CLINIC_OR_DEPARTMENT_OTHER): Payer: Self-pay | Admitting: Family Medicine

## 2022-10-11 DIAGNOSIS — E782 Mixed hyperlipidemia: Secondary | ICD-10-CM

## 2022-10-26 ENCOUNTER — Ambulatory Visit (HOSPITAL_COMMUNITY)
Admission: RE | Admit: 2022-10-26 | Discharge: 2022-10-26 | Disposition: A | Discharge status: Home / Self Care | Payer: Managed Care, Other (non HMO) | Source: Ambulatory Visit | Attending: Family Medicine | Admitting: Family Medicine

## 2022-10-26 DIAGNOSIS — E782 Mixed hyperlipidemia: Secondary | ICD-10-CM | POA: Insufficient documentation

## 2022-12-13 ENCOUNTER — Other Ambulatory Visit: Payer: Self-pay | Admitting: *Deleted

## 2022-12-13 MED ORDER — TIZANIDINE HCL 4 MG PO TABS: 4.0000 mg | ORAL_TABLET | Freq: Every evening | ORAL | 2 refills | Status: DC | PRN

## 2022-12-13 NOTE — Telephone Encounter (Signed)
Refill request received via fax from [facility]- Tizanidine  for Tizanidine  Last Fill: 04/05/2022  Next Visit: 01/12/2023  Last Visit: 07/07/2022  Dx: Fibromyalgia   Current Dose per office note on 07/07/2022: tizanidine 4 mg at bedtime   Okay to refill Tizanidine?

## 2023-01-01 NOTE — Progress Notes (Signed)
Office Visit Note  Patient: Charlotte Fox             Date of Birth: Nov 07, 1965           MRN: [id number]             PCP: Gweneth Dimitri, MD Referring: Gweneth Dimitri, MD Visit Date: 01/12/2023 Occupation: @GUAROCC @  Subjective:  Pain in joints and muscles   History of Present Illness: Charlotte Fox is a 57 y.o. female with osteoarthritis, fibromyalgia and positive ANA.  She states she continues to have generalized aches and pains.  She complains of discomfort in bilateral trapezius region, bilateral epicondyle region and also over trochanteric region.  She states she does stretching exercises as needed.  She takes Zanaflex 4 mg p.o. nightly as needed muscle spasm.  She also has a prescription for meloxicam by her PCP which she takes on as needed basis.  Patient states she had several episodes of polyethylene glycol this year for which she required prednisone taper.  She denies any history of oral ulcers, nasal ulcers, malar rash, Raynaud's, lymphadenopathy.  There is no history of inflammatory arthritis.    Activities of Daily Living:  Patient reports morning stiffness for 1 hour.   Patient Reports nocturnal pain.  Difficulty dressing/grooming: Denies Difficulty climbing stairs: Denies Difficulty getting out of chair: Denies Difficulty using hands for taps, buttons, cutlery, and/or writing: Denies  Review of Systems  Constitutional:  Positive for fatigue.  HENT:  Negative for mouth sores and mouth dryness.   Eyes:  Positive for dryness.  Respiratory:  Positive for cough, shortness of breath and wheezing.   Cardiovascular:  Negative for chest pain and palpitations.  Gastrointestinal:  Negative for blood in stool, constipation and diarrhea.  Endocrine: Negative for increased urination.  Genitourinary:  Positive for involuntary urination.  Musculoskeletal:  Positive for joint pain, joint pain, myalgias, muscle weakness, morning stiffness, muscle tenderness and myalgias.  Negative for gait problem and joint swelling.  Skin:  Positive for sensitivity to sunlight. Negative for color change, rash and hair loss.  Allergic/Immunologic: Positive for susceptible to infections.  Neurological:  Negative for dizziness and headaches.  Hematological:  Negative for swollen glands.  Psychiatric/Behavioral:  Positive for sleep disturbance. Negative for depressed mood. The patient is not nervous/anxious.     PMFS History:  Patient Active Problem List   Diagnosis Date Noted   Allergic rhinitis due to pollen 08/19/2021   Anemia 08/19/2021   Anxiety 08/19/2021   Cerebral infarction (HCC) 08/19/2021   Cough variant asthma 08/19/2021   Functional dyspepsia 08/19/2021   Migraine without aura, not intractable, without status migrainosus 08/19/2021   Mild intermittent asthma 08/19/2021   Obstructive sleep apnea (adult) (pediatric) 08/19/2021   History of colonic polyps 08/19/2021   Surgical menopause 08/19/2021   Vitamin D deficiency 08/19/2021   Hyperlipidemia, unspecified 08/19/2021   Hypertriglyceridemia 08/19/2021   Travel advice encounter 03/23/2017   Class 3 severe obesity due to excess calories without serious comorbidity with body mass index (BMI) of 40.0 to 44.9 in adult [facility]) 12/13/2016   History of bowel resection 12/13/2016   History of hypertension 12/13/2016   History of depression 12/13/2016   History of ankle surgery 12/12/2016   Fatigue 12/10/2015   Fibromyalgia 12/09/2015   Osteoarthritis of both hands 12/09/2015   Osteoarthritis of right hip 12/09/2015   Hypertension 12/09/2015   Elevated cholesterol 12/09/2015   Migraines 12/09/2015   Insomnia 12/09/2015   History of endometriosis 12/09/2015  Ventral hernia without obstruction or gangrene 09/01/2014   Mood disorder (HCC) 10/21/2013   GERD (gastroesophageal reflux disease) 10/21/2013   Nausea with vomiting 10/21/2013   Hypokalemia 10/21/2013   Cellulitis of right leg 10/20/2013   Cellulitis  of right foot 10/20/2013   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   Optic neuropathy 10/09/2012   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    Past Medical History:  Diagnosis Date   Acid reflux    Anemia    Arthritis    Asthma    Complication of anesthesia    propofol dropped resp during colonoscopy, woke up during procedure   Depression    Elevated cholesterol 12/09/2015   Fibromyalgia    Headache    hx migraines   Heart murmur    does not give her any problems   High cholesterol    History of endometriosis 12/09/2015   History of hiatal hernia    Hypertension    Insomnia 12/09/2015   Migraines 12/09/2015   Osteoarthritis of both hands 12/09/2015   Osteoarthritis of right hip 12/09/2015   Mild   Patent foramen ovale    no longer sees cardiology   Retinal artery occlusion    Rosacea    dx by derm, per patient   Stroke [facility])    retinal artery   several yrs ago,  left eye diminished vision     Family History  Problem Relation Age of Onset   Hypertension Mother    Diabetes Mother    Arthritis Mother    Hypertension Brother    Cancer Other    Heart disease Other    Past Surgical History:  Procedure Laterality Date   ABDOMINAL HYSTERECTOMY     appendectomy/ colon   ANKLE SURGERY     at age 34   APPENDECTOMY     CHOLECYSTECTOMY N/A 05/21/2014   Procedure: LAPAROSCOPIC CHOLECYSTECTOMY WITH INTRAOPERATIVE CHOLANGIOGRAM;  Surgeon: Manus Rudd, MD;  Location: [facility] OR;  Service: General;  Laterality: N/A;   COLON SURGERY     removed 5 in colon during hysterectmy [place] due to mass   COLONOSCOPY     x 2   KNEE ARTHROPLASTY     KNEE SURGERY     TOE SURGERY     VENTRAL HERNIA REPAIR  09/01/2014   with mesh   VENTRAL HERNIA REPAIR N/A 09/01/2014   Procedure: LAPAROSCOPIC VENTRAL HERNIA REPAIR WITH MESH;  Surgeon: Manus Rudd, MD;  Location: [facility] OR;  Service: General;  Laterality: N/A;   Social History   Social History Narrative   Not on file    Immunization History  Administered Date(s) Administered   Hep A / Hep B 03/25/2021, 04/22/2021   Hepatitis A, Adult 03/23/2017   Hepatitis B 06/05/1997, 07/17/1997   Influenza,inj,Quad PF,6+ Mos 01/10/2016, 02/05/2017, 12/05/2018, 10/14/2020   Influenza,inj,Quad PF,6-35 Mos 11/08/2017, 10/07/2019   Influenza-Unspecified 11/08/2017, 12/05/2018   PFIZER(Purple Top)SARS-COV-2 Vaccination 04/05/2019, 04/26/2019, 11/21/2019   Pfizer Covid-19 Vaccine Bivalent Booster 80yrs & up 11/12/2020   Pneumococcal Polysaccharide-23 01/10/2016   Td 05/30/2001   Tdap 01/06/2009, 03/13/2019   Typhoid Inactivated 03/23/2017   Zoster, Live 03/13/2019, 10/31/2019     Objective: Vital Signs: BP 137/86 (BP Location: Left Arm, Patient Position: Sitting, Cuff Size: Large)   Pulse 66   Resp 15   Ht 5\' 6"  (1.676 m)   Wt 239 lb 6.4 oz (108.6 kg)   LMP 12/31/2011   BMI 38.64 kg/m    Physical Exam   Musculoskeletal  Exam: Cervical spine was in good range of motion.  She had bilateral trapezius spasm.  Shoulder joints, elbow joints, wrist joints, MCPs PIPs and DIPs with good range of motion.  She had mild PIP and DIP thickening.  She had tenderness over bilateral lateral epicondyle region he.  She had tenderness over bilateral trochanteric region.  Hip joints were difficult to assess in the seated position.  Patient preferred not to lay down as she was having a cough and laying down triggers her cough.  Knee joints with good range of motion.  There was no tenderness over ankles or MTPs.  CDAI Exam: CDAI Score: -- Patient Global: --; Provider Global: -- Swollen: --; Tender: -- Joint Exam 01/12/2023   No joint exam has been documented for this visit   There is currently no information documented on the homunculus. Go to the Rheumatology activity and complete the homunculus joint exam.  Investigation: No additional findings.  Imaging: No results found.  Recent Labs: Lab Results  Component Value Date    WBC 10.1 02/28/2022   HGB 13.4 02/28/2022   PLT 320 06/09/2016   NA 140 02/28/2022   K 4.3 02/28/2022   CL 102 02/28/2022   CO2 24 02/28/2022   GLUCOSE 89 02/28/2022   BUN 18 02/28/2022   CREATININE 1.07 (H) 02/28/2022   BILITOT 0.4 02/28/2022   ALKPHOS 75 02/28/2022   AST 15 02/28/2022   ALT 13 02/28/2022   PROT 6.2 02/28/2022   ALBUMIN 4.1 02/28/2022   CALCIUM 9.1 02/28/2022   GFRAA 89 06/09/2016    Speciality Comments: Polyethylene glycol allergy-patient cannot take methocarbamol.  Procedures:  No procedures performed Allergies: Erythromycin, Propofol, Sulfa antibiotics, Levofloxacin, Fexofenadine-pseudoephed er, Hydrocodone, Hydrocodone-acetaminophen, Penicillin g sodium, Rosuvastatin, Sulfamethoxazole, Azithromycin, Penicillins, Polyethylene glycol, and Vicodin [hydrocodone-acetaminophen]   Assessment / Plan:     Visit Diagnoses: Primary osteoarthritis of both hands-she has mild PIP and DIP thickening with no synovitis.  Joint protection muscle strengthening was discussed.  Lateral epicondylitis of both elbows-she continues to have some tenderness over lateral epicondyle region more prominent on the left side.  Forearm exercises were discussed.  Primary osteoarthritis of right hip-I could not assess the range of motion in her hip joint in the seated position.  Trochanteric bursitis of both hips-she had tenderness over bilateral trochanteric region.  IT band stretches were discussed and demonstrated.  Trapezius muscle spasm-she continues to have trapezius spasm.  This stretches were advised.  She takes tizanidine as needed.  Positive ANA (antinuclear antibody) - +ANA-no titer. anti-cardiolipin ab-, beta-2 glycoprotein antibodies negative, La 5.0, Ro-, SM-, RNP-, dsDNA negative: Patient denies any history of oral ulcers, nasal ulcers, malar rash, photosensitivity, Raynaud's, lymphadenopathy or inflammatory arthritis.  She has no clinical features of autoimmune disease.  No  further workup is needed at this point.  Fibromyalgia -she continues to have generalized pain and discomfort from fibromyalgia.  She had hyperalgesia and positive tender points.  She continues to have bilateral trapezius spasm, lateral epicondyle region discomfort and trochanteric bursitis.  Need for stretching exercises were emphasized.  She is taking meloxicam 15 mg daily for pain relief and tizanidine 4 mg at bedtime.  History of insomnia - She takes tizanidine 4 mg at bedtime for muscle spasms and insomnia.  Rosacea-patient states she was recently diagnosed with rosacea by her dermatologist and the topical treatment has been useful.  Other medical problems are listed as follows:  History of hypercholesterolemia  History of depression  History of hypertension  History of  gastroesophageal reflux (GERD)  History of bowel resection  History of migraine  Allergy, sequela - Polyethylene glycol allergy-recurrent episodes.  Patient requires prednisone frequently for the flares.  Orders: No orders of the defined types were placed in this encounter.  No orders of the defined types were placed in this encounter.    Follow-Up Instructions: Return in about 6 months (around 07/13/2023) for Osteoarthritis.   Pollyann Savoy, MD  Note - This record has been created using Animal nutritionist.  Chart creation errors have been sought, but may not always  have been located. Such creation errors do not reflect on  the standard of medical care.

## 2023-01-12 ENCOUNTER — Encounter: Payer: Self-pay | Admitting: Rheumatology

## 2023-01-12 ENCOUNTER — Ambulatory Visit: Payer: Managed Care, Other (non HMO) | Attending: Rheumatology | Admitting: Rheumatology

## 2023-01-12 VITALS — BP 137/86 | HR 66 | Resp 15 | Ht 66.0 in | Wt 239.4 lb

## 2023-01-12 DIAGNOSIS — M7711 Lateral epicondylitis, right elbow: Secondary | ICD-10-CM | POA: Diagnosis not present

## 2023-01-12 DIAGNOSIS — Z8669 Personal history of other diseases of the nervous system and sense organs: Secondary | ICD-10-CM

## 2023-01-12 DIAGNOSIS — Z8639 Personal history of other endocrine, nutritional and metabolic disease: Secondary | ICD-10-CM

## 2023-01-12 DIAGNOSIS — M19041 Primary osteoarthritis, right hand: Secondary | ICD-10-CM | POA: Diagnosis not present

## 2023-01-12 DIAGNOSIS — M1611 Unilateral primary osteoarthritis, right hip: Secondary | ICD-10-CM

## 2023-01-12 DIAGNOSIS — Z87898 Personal history of other specified conditions: Secondary | ICD-10-CM

## 2023-01-12 DIAGNOSIS — R7689 Other specified abnormal immunological findings in serum: Secondary | ICD-10-CM

## 2023-01-12 DIAGNOSIS — M7712 Lateral epicondylitis, left elbow: Secondary | ICD-10-CM

## 2023-01-12 DIAGNOSIS — R21 Rash and other nonspecific skin eruption: Secondary | ICD-10-CM

## 2023-01-12 DIAGNOSIS — T7840XS Allergy, unspecified, sequela: Secondary | ICD-10-CM

## 2023-01-12 DIAGNOSIS — M7062 Trochanteric bursitis, left hip: Secondary | ICD-10-CM

## 2023-01-12 DIAGNOSIS — L719 Rosacea, unspecified: Secondary | ICD-10-CM

## 2023-01-12 DIAGNOSIS — Z9049 Acquired absence of other specified parts of digestive tract: Secondary | ICD-10-CM

## 2023-01-12 DIAGNOSIS — R768 Other specified abnormal immunological findings in serum: Secondary | ICD-10-CM

## 2023-01-12 DIAGNOSIS — M19042 Primary osteoarthritis, left hand: Secondary | ICD-10-CM

## 2023-01-12 DIAGNOSIS — Z8679 Personal history of other diseases of the circulatory system: Secondary | ICD-10-CM

## 2023-01-12 DIAGNOSIS — Z8659 Personal history of other mental and behavioral disorders: Secondary | ICD-10-CM

## 2023-01-12 DIAGNOSIS — M797 Fibromyalgia: Secondary | ICD-10-CM

## 2023-01-12 DIAGNOSIS — Z8719 Personal history of other diseases of the digestive system: Secondary | ICD-10-CM

## 2023-01-12 DIAGNOSIS — M62838 Other muscle spasm: Secondary | ICD-10-CM

## 2023-01-12 DIAGNOSIS — M7061 Trochanteric bursitis, right hip: Secondary | ICD-10-CM | POA: Diagnosis not present

## 2023-04-17 ENCOUNTER — Other Ambulatory Visit: Payer: Self-pay

## 2023-04-17 MED ORDER — TIZANIDINE HCL 4 MG PO TABS: 4.0000 mg | ORAL_TABLET | Freq: Every evening | ORAL | 2 refills | Status: DC | PRN

## 2023-04-17 NOTE — Telephone Encounter (Signed)
 Refill request received via fax from [facility] on [address] in [place] for tizanidine 4mg .   Last Fill: 12/13/2022  Next Visit: 07/13/2023  Last Visit: 01/12/2023  Dx: Fibromyalgia, History of insomnia   Current Dose per office note on 01/12/2023: tizanidine 4 mg at bedtime.   Okay to refill Tizanidine?

## 2023-06-29 NOTE — Progress Notes (Deleted)
 Office Visit Note  Patient: Charlotte Fox             Date of Birth: [birth year]           MRN: [id number]             PCP: Helyn Lobstein, MD Referring: Helyn Lobstein, MD Visit Date: 07/13/2023 Occupation: @GUAROCC @  Subjective:    History of Present Illness: NASEEM ADLER is a 58 y.o. female with history of osteoarthritis and fibromyalgia.    Zanaflex    Activities of Daily Living:  Patient reports morning stiffness for *** {minute/hour:19697}.   Patient {ACTIONS;DENIES/REPORTS:21021675::"Denies"} nocturnal pain.  Difficulty dressing/grooming: {ACTIONS;DENIES/REPORTS:21021675::"Denies"} Difficulty climbing stairs: {ACTIONS;DENIES/REPORTS:21021675::"Denies"} Difficulty getting out of chair: {ACTIONS;DENIES/REPORTS:21021675::"Denies"} Difficulty using hands for taps, buttons, cutlery, and/or writing: {ACTIONS;DENIES/REPORTS:21021675::"Denies"}  No Rheumatology ROS completed.   PMFS History:  Patient Active Problem List   Diagnosis Date Noted   Allergic rhinitis due to pollen 08/19/2021   Anemia 08/19/2021   Anxiety 08/19/2021   Cerebral infarction (HCC) 08/19/2021   Cough variant asthma 08/19/2021   Functional dyspepsia 08/19/2021   Migraine without aura, not intractable, without status migrainosus 08/19/2021   Mild intermittent asthma 08/19/2021   Obstructive sleep apnea (adult) (pediatric) 08/19/2021   History of colonic polyps 08/19/2021   Surgical menopause 08/19/2021   Vitamin D  deficiency 08/19/2021   Hyperlipidemia, unspecified 08/19/2021   Hypertriglyceridemia 08/19/2021   Travel advice encounter 03/23/2017   Class 3 severe obesity due to excess calories without serious comorbidity with body mass index (BMI) of 40.0 to 44.9 in adult 12/13/2016   History of bowel resection 12/13/2016   History of hypertension 12/13/2016   History of depression 12/13/2016   History of ankle surgery 12/12/2016   Fatigue 12/10/2015   Fibromyalgia 12/09/2015    Osteoarthritis of both hands 12/09/2015   Osteoarthritis of right hip 12/09/2015   Hypertension 12/09/2015   Elevated cholesterol 12/09/2015   Migraines 12/09/2015   Insomnia 12/09/2015   History of endometriosis 12/09/2015   Ventral hernia without obstruction or gangrene 09/01/2014   Mood disorder (HCC) 10/21/2013   GERD (gastroesophageal reflux disease) 10/21/2013   Nausea with vomiting 10/21/2013   Hypokalemia 10/21/2013   Cellulitis of right leg 10/20/2013   Cellulitis of right foot 10/20/2013   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   Optic neuropathy 10/09/2012   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    Past Medical History:  Diagnosis Date   Acid reflux    Anemia    Arthritis    Asthma    Complication of anesthesia    propofol  dropped resp during colonoscopy, woke up during procedure   Depression    Elevated cholesterol 12/09/2015   Fibromyalgia    Headache    hx migraines   Heart murmur    does not give her any problems   High cholesterol    History of endometriosis 12/09/2015   History of hiatal hernia    Hypertension    Insomnia 12/09/2015   Migraines 12/09/2015   Osteoarthritis of both hands 12/09/2015   Osteoarthritis of right hip 12/09/2015   Mild   Patent foramen ovale    no longer sees cardiology   Retinal artery occlusion    Rosacea    dx by derm, per patient   Stroke [facility])    retinal artery   several yrs ago,  left eye diminished vision     Family History  Problem Relation Age of Onset   Hypertension Mother    Diabetes Mother  Arthritis Mother    Hypertension Brother    Cancer Other    Heart disease Other    Past Surgical History:  Procedure Laterality Date   ABDOMINAL HYSTERECTOMY     appendectomy/ colon   ANKLE SURGERY     at age 84   APPENDECTOMY     CHOLECYSTECTOMY N/A 05/21/2014   Procedure: LAPAROSCOPIC CHOLECYSTECTOMY WITH INTRAOPERATIVE CHOLANGIOGRAM;  Surgeon: Dareen Ebbing, MD;  Location: [facility] OR;  Service: General;   Laterality: N/A;   COLON SURGERY     removed 5 in colon during hysterectmy [place] due to mass   COLONOSCOPY     x 2   KNEE ARTHROPLASTY     KNEE SURGERY     TOE SURGERY     VENTRAL HERNIA REPAIR  09/01/2014   with mesh   VENTRAL HERNIA REPAIR N/A 09/01/2014   Procedure: LAPAROSCOPIC VENTRAL HERNIA REPAIR WITH MESH;  Surgeon: Dareen Ebbing, MD;  Location: [facility] OR;  Service: General;  Laterality: N/A;   Social History   Social History Narrative   Not on file   Immunization History  Administered Date(s) Administered   Hep A / Hep B 03/25/2021, 04/22/2021   Hepatitis A, Adult 03/23/2017   Hepatitis B 06/05/1997, 07/17/1997   Influenza,inj,Quad PF,6+ Mos 01/10/2016, 02/05/2017, 12/05/2018, 10/14/2020   Influenza,inj,Quad PF,6-35 Mos 11/08/2017, 10/07/2019   Influenza-Unspecified 11/08/2017, 12/05/2018   PFIZER(Purple Top)SARS-COV-2 Vaccination 04/05/2019, 04/26/2019, 11/21/2019   Pfizer Covid-19 Vaccine Bivalent Booster 70yrs & up 11/12/2020   Pneumococcal Polysaccharide-23 01/10/2016   Td 05/30/2001   Tdap 01/06/2009, 03/13/2019   Typhoid Inactivated 03/23/2017   Zoster, Live 03/13/2019, 10/31/2019     Objective: Vital Signs: LMP 12/31/2011    Physical Exam Vitals and nursing note reviewed.  Constitutional:      Appearance: She is well-developed.  HENT:     Head: Normocephalic and atraumatic.  Eyes:     Conjunctiva/sclera: Conjunctivae normal.  Cardiovascular:     Rate and Rhythm: Normal rate and regular rhythm.     Heart sounds: Normal heart sounds.  Pulmonary:     Effort: Pulmonary effort is normal.     Breath sounds: Normal breath sounds.  Abdominal:     General: Bowel sounds are normal.     Palpations: Abdomen is soft.  Musculoskeletal:     Cervical back: Normal range of motion.  Lymphadenopathy:     Cervical: No cervical adenopathy.  Skin:    General: Skin is warm and dry.     Capillary Refill: Capillary refill takes less than 2 seconds.  Neurological:      Mental Status: She is alert and oriented to person, place, and time.  Psychiatric:        Behavior: Behavior normal.      Musculoskeletal Exam: ***  CDAI Exam: CDAI Score: -- Patient Global: --; Provider Global: -- Swollen: --; Tender: -- Joint Exam 07/13/2023   No joint exam has been documented for this visit   There is currently no information documented on the homunculus. Go to the Rheumatology activity and complete the homunculus joint exam.  Investigation: No additional findings.  Imaging: No results found.  Recent Labs: Lab Results  Component Value Date   WBC 10.1 02/28/2022   HGB 13.4 02/28/2022   PLT 320 06/09/2016   NA 140 02/28/2022   K 4.3 02/28/2022   CL 102 02/28/2022   CO2 24 02/28/2022   GLUCOSE 89 02/28/2022   BUN 18 02/28/2022   CREATININE 1.07 (H) 02/28/2022   BILITOT  0.4 02/28/2022   ALKPHOS 75 02/28/2022   AST 15 02/28/2022   ALT 13 02/28/2022   PROT 6.2 02/28/2022   ALBUMIN 4.1 02/28/2022   CALCIUM  9.1 02/28/2022   GFRAA 89 06/09/2016    Speciality Comments: Polyethylene glycol allergy-patient cannot take methocarbamol .  Procedures:  No procedures performed Allergies: Erythromycin, Propofol , Sulfa antibiotics, Levofloxacin, Fexofenadine-pseudoephed er, Hydrocodone, Hydrocodone-acetaminophen , Penicillin g sodium, Rosuvastatin , Sulfamethoxazole, Azithromycin , Penicillins, Polyethylene glycol, and Vicodin [hydrocodone-acetaminophen ]   Assessment / Plan:     Visit Diagnoses: Primary osteoarthritis of both hands  Lateral epicondylitis of both elbows  Primary osteoarthritis of right hip  Trochanteric bursitis of both hips  Trapezius muscle spasm  Positive ANA (antinuclear antibody)  Fibromyalgia  History of insomnia  Rosacea  History of hypercholesterolemia  History of depression  History of hypertension  History of gastroesophageal reflux (GERD)  History of bowel resection  History of migraine  Allergy,  sequela  Orders: No orders of the defined types were placed in this encounter.  No orders of the defined types were placed in this encounter.   Face-to-face time spent with patient was *** minutes. Greater than 50% of time was spent in counseling and coordination of care.  Follow-Up Instructions: No follow-ups on file.   Romayne Clubs, PA-C  Note - This record has been created using Dragon software.  Chart creation errors have been sought, but may not always  have been located. Such creation errors do not reflect on  the standard of medical care.

## 2023-07-11 ENCOUNTER — Emergency Department (HOSPITAL_COMMUNITY)
Admission: EM | Admit: 2023-07-11 | Discharge: 2023-07-12 | Disposition: A | Discharge status: Home / Self Care | Attending: Emergency Medicine | Admitting: Emergency Medicine

## 2023-07-11 ENCOUNTER — Encounter (HOSPITAL_COMMUNITY): Payer: Self-pay

## 2023-07-11 ENCOUNTER — Emergency Department (HOSPITAL_COMMUNITY)

## 2023-07-11 DIAGNOSIS — R1084 Generalized abdominal pain: Secondary | ICD-10-CM | POA: Insufficient documentation

## 2023-07-11 DIAGNOSIS — N179 Acute kidney failure, unspecified: Secondary | ICD-10-CM | POA: Diagnosis not present

## 2023-07-11 DIAGNOSIS — R197 Diarrhea, unspecified: Secondary | ICD-10-CM | POA: Insufficient documentation

## 2023-07-11 DIAGNOSIS — E860 Dehydration: Secondary | ICD-10-CM | POA: Diagnosis not present

## 2023-07-11 DIAGNOSIS — Z79899 Other long term (current) drug therapy: Secondary | ICD-10-CM | POA: Diagnosis not present

## 2023-07-11 DIAGNOSIS — R1110 Vomiting, unspecified: Secondary | ICD-10-CM | POA: Insufficient documentation

## 2023-07-11 DIAGNOSIS — I10 Essential (primary) hypertension: Secondary | ICD-10-CM | POA: Diagnosis not present

## 2023-07-11 LAB — CBC
HCT: 45.9 % (ref 36.0–46.0)
Hemoglobin: 15.2 g/dL — ABNORMAL HIGH (ref 12.0–15.0)
MCH: 28.6 pg (ref 26.0–34.0)
MCHC: 33.1 g/dL (ref 30.0–36.0)
MCV: 86.3 fL (ref 80.0–100.0)
Platelets: 305 10*3/uL (ref 150–400)
RBC: 5.32 MIL/uL — ABNORMAL HIGH (ref 3.87–5.11)
RDW: 12.5 % (ref 11.5–15.5)
WBC: 12.4 10*3/uL — ABNORMAL HIGH (ref 4.0–10.5)
nRBC: 0 % (ref 0.0–0.2)

## 2023-07-11 LAB — URINALYSIS, ROUTINE W REFLEX MICROSCOPIC
Bilirubin Urine: NEGATIVE
Glucose, UA: NEGATIVE mg/dL
Hgb urine dipstick: NEGATIVE
Ketones, ur: 80 mg/dL — AB
Leukocytes,Ua: NEGATIVE
Nitrite: NEGATIVE
Protein, ur: NEGATIVE mg/dL
Specific Gravity, Urine: 1.045 — ABNORMAL HIGH (ref 1.005–1.030)
pH: 6 (ref 5.0–8.0)

## 2023-07-11 LAB — COMPREHENSIVE METABOLIC PANEL WITH GFR
ALT: 18 U/L (ref 0–44)
AST: 22 U/L (ref 15–41)
Albumin: 4 g/dL (ref 3.5–5.0)
Alkaline Phosphatase: 43 U/L (ref 38–126)
Anion gap: 16 — ABNORMAL HIGH (ref 5–15)
BUN: 23 mg/dL — ABNORMAL HIGH (ref 6–20)
CO2: 19 mmol/L — ABNORMAL LOW (ref 22–32)
Calcium: 9.3 mg/dL (ref 8.9–10.3)
Chloride: 102 mmol/L (ref 98–111)
Creatinine, Ser: 1.52 mg/dL — ABNORMAL HIGH (ref 0.44–1.00)
GFR, Estimated: 40 mL/min — ABNORMAL LOW (ref >60)
Glucose, Bld: 142 mg/dL — ABNORMAL HIGH (ref 70–99)
Potassium: 3.7 mmol/L (ref 3.5–5.1)
Sodium: 137 mmol/L (ref 135–145)
Total Bilirubin: 1.4 mg/dL — ABNORMAL HIGH (ref 0.0–1.2)
Total Protein: 7.2 g/dL (ref 6.5–8.1)

## 2023-07-11 LAB — LIPASE, BLOOD: Lipase: 49 U/L (ref 11–51)

## 2023-07-11 MED ORDER — IOHEXOL 350 MG/ML SOLN
75.0000 mL | Freq: Once | INTRAVENOUS | Status: AC | PRN
  Administered 2023-07-11: 75 mL via INTRAVENOUS

## 2023-07-11 MED ORDER — ONDANSETRON HCL 4 MG/2ML IJ SOLN
4.0000 mg | Freq: Once | INTRAMUSCULAR | Status: AC
  Administered 2023-07-11: 4 mg via INTRAVENOUS
  Filled 2023-07-11: qty 2

## 2023-07-11 MED ORDER — SODIUM CHLORIDE 0.9 % IV BOLUS
1000.0000 mL | Freq: Once | INTRAVENOUS | Status: AC
  Administered 2023-07-11: 1000 mL via INTRAVENOUS

## 2023-07-11 MED ORDER — ONDANSETRON HCL 4 MG/2ML IJ SOLN: 4.0000 mg | Freq: Once | INTRAMUSCULAR | Status: DC | PRN

## 2023-07-11 MED ORDER — MORPHINE SULFATE (PF) 4 MG/ML IV SOLN
4.0000 mg | Freq: Once | INTRAVENOUS | Status: AC
  Administered 2023-07-11: 4 mg via INTRAVENOUS
  Filled 2023-07-11: qty 1

## 2023-07-11 MED ORDER — SODIUM CHLORIDE 0.9 % IV SOLN
12.5000 mg | Freq: Once | INTRAVENOUS | Status: AC
  Administered 2023-07-11: 12.5 mg via INTRAVENOUS
  Filled 2023-07-11: qty 12.5

## 2023-07-11 NOTE — ED Provider Notes (Signed)
 [facility] AT [facility] Provider Note   CSN: [id number] Arrival date & time: 07/11/23  1910     History  Chief Complaint  Patient presents with   Nausea    Charlotte Fox is a 58 y.o. female.  Patient to ED with vomiting and diarrhea that started 4 days ago. It seemed to subside on day 2 and she started being able to drink fluids. Today she advanced her diet to include applesauce and juice and reports the vomiting and diarrhea started back. She has abdominal pain across the upper abdomen and expresses concern for pancreatitis because she is on a diet medication she started 2 months ago and the condition is considered an adverse reaction. No fever. No hematemesis or bloody stools.   The history is provided by the patient and a relative. No language interpreter was used.       Home Medications Prior to Admission medications   Medication Sig Start Date End Date Taking? Authorizing Provider  cholecalciferol (VITAMIN D ) 1000 UNITS tablet Take 4,000 Units by mouth daily.    [provider]  desonide (DESOWEN) 0.05 % ointment 1 application Externally once a day for 30 days 10/11/22   [provider]  doxycycline  (VIBRAMYCIN ) 50 MG capsule 1 capsule Orally once daily Patient not taking: Reported on 01/12/2023    [provider]  EPINEPHrine  0.3 mg/0.3 mL IJ SOAJ injection SMARTSIG:0.3 Milliliter(s) IM Once PRN 08/11/21   [provider]  estradiol  (CLIMARA  - DOSED IN MG/24 HR) 0.05 mg/24hr patch Place 0.05 mg onto the skin once a week. Change patch every Tuesday.    [provider]  FLUoxetine  (PROZAC ) 20 MG capsule Take 20 mg by mouth daily.    [provider]  loratadine  (CLARITIN ) 10 MG tablet Take 10 mg by mouth daily as needed for allergies.    [provider]  LORazepam  (ATIVAN ) 0.5 MG tablet 1/2 to 1 tablet Orally twice a day as needed - PEG-FREE FORMULATION ONLY    [provider]  meloxicam (MOBIC) 15 MG tablet Take 15 mg by mouth daily.    [provider]  metroNIDAZOLE (METROCREAM) 0.75 % cream APPLY DIME-SIZE AMOUNT TOPICALLY TO CHEEKS AND NOSE EVERY NIGHT AS DIRECTED External for 20 Days    [provider]  Misc Natural Products (TART CHERRY ADVANCED PO) Take 1 capsule by mouth daily at 12 noon.    [provider]  mometasone-formoterol (DULERA) 100-5 MCG/ACT AERO Inhale 2 puffs as needed into the lungs for wheezing.    [provider]  montelukast (SINGULAIR) 10 MG tablet Take by mouth.    [provider]  NURTEC 75 MG TBDP Take 1 tablet by mouth daily as needed.    [provider]  omeprazole (PRILOSEC) 20 MG capsule Take 20 mg by mouth daily.    [provider]  pravastatin (PRAVACHOL) 40 MG tablet Take 40 mg by mouth daily. 06/01/22   [provider]  predniSONE (DELTASONE) 10 MG tablet Take 10 mg by mouth daily. 05/04/22   [provider]  PROAIR RESPICLICK 108 (90 Base) MCG/ACT AEPB INHALE 2 PUFFS BY MOUTH EVERY 4 6 HOURS AS NEEDED 04/16/18   [provider]  promethazine  (PHENERGAN ) 25 MG tablet Take 25 mg by mouth every 6 (six) hours as needed for nausea or vomiting.    [provider]  telmisartan (MICARDIS) 80 MG tablet Take 80 mg by mouth daily.    [provider]  tiZANidine  (ZANAFLEX ) 4 MG tablet Take 1 tablet (4 mg total) by mouth at bedtime as needed for muscle spasms. 04/17/23   Nicholas Bari, MD  VIBRAMYCIN  100 MG capsule Take 100 mg by mouth daily. 12/15/22   [provider]      Allergies    Erythromycin, Propofol , Sulfa antibiotics, Levofloxacin, Fexofenadine-pseudoephed er, Hydrocodone, Hydrocodone-acetaminophen , Penicillin g sodium, Rosuvastatin , Sulfamethoxazole, Azithromycin , Penicillins, Polyethylene glycol, and Vicodin [hydrocodone-acetaminophen ]    Review of Systems   Review of Systems  Physical Exam Updated  Vital Signs BP (!) 131/58 (BP Location: Left Arm)   Pulse 76   Temp 97.7 F (36.5 C) (Oral)   Resp 20   LMP 12/31/2011   SpO2 98%  Physical Exam Constitutional:      Appearance: She is well-developed. She is obese. She is ill-appearing.  HENT:     Head: Normocephalic.     Mouth/Throat:     Mouth: Mucous membranes are dry.  Cardiovascular:     Rate and Rhythm: Normal rate and regular rhythm.     Heart sounds: No murmur heard. Pulmonary:     Effort: Pulmonary effort is normal.     Breath sounds: Normal breath sounds. No wheezing, rhonchi or rales.  Abdominal:     General: Bowel sounds are normal.     Palpations: Abdomen is soft.     Tenderness: There is abdominal tenderness (diffuse). There is no guarding or rebound.  Musculoskeletal:        General: Normal range of motion.     Cervical back: Normal range of motion and neck supple.  Skin:    General: Skin is warm and dry.  Neurological:     General: No focal deficit present.     Mental Status: She is alert and oriented to person, place, and time.     ED Results / Procedures / Treatments   Labs (all labs ordered are listed, but only abnormal results are displayed) Labs Reviewed  COMPREHENSIVE METABOLIC PANEL WITH GFR - Abnormal; Notable for the following components:      Result Value   CO2 19 (*)    Glucose, Bld 142 (*)    BUN 23 (*)    Creatinine, Ser 1.52 (*)    Total Bilirubin 1.4 (*)    GFR, Estimated 40 (*)    Anion gap 16 (*)    All other components within normal limits  CBC - Abnormal; Notable for the following components:   WBC 12.4 (*)    RBC 5.32 (*)    Hemoglobin 15.2 (*)    All other components within normal limits  LIPASE, BLOOD  URINALYSIS, ROUTINE W REFLEX MICROSCOPIC   Results for orders placed or performed during the hospital encounter of 07/11/23  Lipase, blood   Collection Time: 07/11/23  7:53 PM  Result Value Ref Range   Lipase 49 11 - 51 U/L  Comprehensive metabolic panel   Collection  Time: 07/11/23  7:53 PM  Result Value Ref Range   Sodium 137 135 - 145 mmol/L   Potassium 3.7 3.5 - 5.1 mmol/L   Chloride 102 98 - 111 mmol/L   CO2 19 (L) 22 - 32 mmol/L   Glucose, Bld 142 (H) 70 - 99 mg/dL   BUN 23 (H) 6 - 20 mg/dL   Creatinine, Ser 8.11 (H) 0.44 - 1.00 mg/dL   Calcium  9.3 8.9 - 10.3 mg/dL   Total Protein 7.2 6.5 - 8.1 g/dL   Albumin 4.0 3.5 - 5.0 g/dL  AST 22 15 - 41 U/L   ALT 18 0 - 44 U/L   Alkaline Phosphatase 43 38 - 126 U/L   Total Bilirubin 1.4 (H) 0.0 - 1.2 mg/dL   GFR, Estimated 40 (L) >60 mL/min   Anion gap 16 (H) 5 - 15  CBC   Collection Time: 07/11/23  7:53 PM  Result Value Ref Range   WBC 12.4 (H) 4.0 - 10.5 K/uL   RBC 5.32 (H) 3.87 - 5.11 MIL/uL   Hemoglobin 15.2 (H) 12.0 - 15.0 g/dL   HCT 21.3 08.6 - 57.8 %   MCV 86.3 80.0 - 100.0 fL   MCH 28.6 26.0 - 34.0 pg   MCHC 33.1 30.0 - 36.0 g/dL   RDW 46.9 62.9 - 52.8 %   Platelets 305 150 - 400 K/uL   nRBC 0.0 0.0 - 0.2 %    EKG EKG Interpretation Date/Time:  Wednesday July 11 2023 19:33:39 EDT Ventricular Rate:  78 PR Interval:  126 QRS Duration:  92 QT Interval:  386 QTC Calculation: 440 R Axis:   19  Text Interpretation: Normal sinus rhythm with sinus arrhythmia T wave abnormality, consider inferior ischemia Abnormal ECG When compared with ECG of 12-May-2014 08:49, PREVIOUS ECG IS PRESENT Confirmed by Rafael Bun [phone]) on 07/11/2023 11:25:48 PM  Radiology CT ABDOMEN PELVIS W CONTRAST Result Date: 07/11/2023 CLINICAL DATA:  Abdomen pain weakness vomiting and diarrhea EXAM: CT ABDOMEN AND PELVIS WITH CONTRAST TECHNIQUE: Multidetector CT imaging of the abdomen and pelvis was performed using the standard protocol following bolus administration of intravenous contrast. RADIATION DOSE REDUCTION: This exam was performed according to the departmental dose-optimization program which includes automated exposure control, adjustment of the mA and/or kV according to patient size and/or use of  iterative reconstruction technique. CONTRAST:  75mL OMNIPAQUE  IOHEXOL  350 MG/ML SOLN COMPARISON:  CT 11/18/2014 FINDINGS: Lower chest: Lung bases demonstrate no acute airspace disease. Scarring or atelectasis at the left base. Hepatobiliary: Cholecystectomy. Minimal intra hepatic biliary dilatation likely due to postsurgical change Pancreas: Unremarkable. No pancreatic ductal dilatation or surrounding inflammatory changes. Spleen: Normal in size without focal abnormality. Adrenals/Urinary Tract: Adrenal glands are unremarkable. Kidneys are normal, without renal calculi, focal lesion, or hydronephrosis. Bladder is unremarkable. Stomach/Bowel: Stomach is within normal limits. Postsurgical changes at the sigmoid colon. No evidence of bowel wall thickening, distention, or inflammatory changes. Vascular/Lymphatic: No significant vascular findings are present. No enlarged abdominal or pelvic lymph nodes. Reproductive: Status post hysterectomy. No adnexal masses. Other: Negative for pelvic effusion or free air Musculoskeletal: No acute or suspicious osseous abnormality IMPRESSION: 1. No CT evidence for acute intra-abdominal or pelvic abnormality. Electronically Signed   By: Esmeralda Hedge M.D.   On: 07/11/2023 22:04    Procedures Procedures    Medications Ordered in ED Medications  ondansetron  (ZOFRAN ) injection 4 mg (has no administration in time range)  sodium chloride  0.9 % bolus 1,000 mL (has no administration in time range)  promethazine  (PHENERGAN ) 12.5 mg in sodium chloride  0.9 % 50 mL IVPB (has no administration in time range)  ondansetron  (ZOFRAN ) injection 4 mg (4 mg Intravenous Given 07/11/23 2050)  morphine  (PF) 4 MG/ML injection 4 mg (4 mg Intravenous Given 07/11/23 2050)  sodium chloride  0.9 % bolus 1,000 mL (1,000 mLs Intravenous New Bag/Given 07/11/23 2203)  iohexol  (OMNIPAQUE ) 350 MG/ML injection 75 mL (75 mLs Intravenous Contrast Given 07/11/23 2151)    ED Course/ Medical Decision Making/  A&P  Medical Decision Making This patient presents to the ED for concern of vomiting/diarrhea, this involves an extensive number of treatment options, and is a complaint that carries with it a high risk of complications and morbidity.  The differential diagnosis includes gastroenteritis, obstruction, diverticulitis, ischemic bowel   Co morbidities that complicate the patient evaluation  HTN, HLD, retinal artery stroke,    Additional history obtained:  Additional history and/or information obtained from chart review, notable for Review of chart shows Zepbound for weight loss. Per patient, started about 1 month ago.    Lab Tests:  I Ordered, and personally interpreted labs.  The pertinent results include:   CBC: WBC12.4, hgb 15.2, normal plts Cmet: Cr 1.52, CO2 19, normal lipase    Imaging Studies ordered:  I ordered imaging studies including CT abd/pel Per radiologist interpretation: IMPRESSION: 1. No CT evidence for acute intra-abdominal or pelvic abnormality.    Cardiac Monitoring:  The patient was maintained on a cardiac monitor.  I personally viewed and interpreted the cardiac monitored which showed an underlying rhythm of:  EKG Interpretation Date/Time:  Wednesday July 11 2023 19:33:39 EDT Ventricular Rate:  78 PR Interval:  126 QRS Duration:  92 QT Interval:  386 QTC Calculation: 440 R Axis:   19  Text Interpretation: Normal sinus rhythm with sinus arrhythmia T wave abnormality, consider inferior ischemia Abnormal ECG When compared with ECG of 12-May-2014 08:49, PREVIOUS ECG IS PRESENT Confirmed by Rafael Bun [phone]) on 07/11/2023 11:25:48 PM   Medicines ordered and prescription drug management:  I ordered medication including zofran , morphine   for nausea and pain Reevaluation of the patient after these medicines showed that the patient improved I have reviewed the patients home medicines and have made adjustments as  needed   Test Considered:  N/a   Critical Interventions:  N/a   Consultations Obtained:  I requested consultation with the n/a,  and discussed lab and imaging findings as well as pertinent plan - they recommend: n/a   Problem List / ED Course:  Here with vomiting and diarrhea, diffuse mid- and upper abdominal pain CT abd/pel negative for acute process On Zepbound for weight loss - relatively new medication with potential side effects of vomiting and diarrhea.  Labs c/w mild AKI, likely dehydration - reviewed with Dr. Ranelle Buys Aggressive rehydration, now her 2nd liter NS running Zofran  and Morphine  provided with some relief but nausea returns Phenergan  provided **patient is concerned about an allergy to polyethylene glycol and sulfonamide structure/antibiotic which she states is a substrate in many different medications. Pharmacy has reviewed each medication provided.    Plan: 2nd liter, phenergan  given at 11:30, will need PO challenge. Discharge home if tolerating PO. Admit if intractable vomiting.   Patient care signed out to oncoming provider to determine appropriate disposition.   Reevaluation:  After the interventions noted above, I reevaluated the patient and found that they have :improved   Social Determinants of Health:  Never a smoker   Disposition:  After consideration of the diagnostic results and the patients response to treatment, I feel that the patient would benefit from:  Disposition to be determined by oncoming provider team. .   Amount and/or Complexity of Data Reviewed Labs: ordered. Radiology: ordered.  Risk Prescription drug management.           Final Clinical Impression(s) / ED Diagnoses Final diagnoses:  Vomiting and diarrhea  Dehydration    Rx / DC Orders ED Discharge Orders     None  Mandy Second, PA-C 07/11/23 2332    Sallyanne Creamer, DO 07/13/23 1138

## 2023-07-11 NOTE — ED Triage Notes (Signed)
 Pt is coming from home with nausea, vomiting, diarrhea since Sunday. She mentions in the last few hours she has gotten more weak due to being not being able eat or drink at all today. She did start a new medication zepbound, but it was started 2 months ago.   Medic vitals   146/98 80hr 98%ra 138bgl 18rr  4mg  Zofran  IM

## 2023-07-12 MED ORDER — ONDANSETRON HCL 4 MG/2ML IJ SOLN
4.0000 mg | Freq: Once | INTRAMUSCULAR | Status: AC
  Administered 2023-07-12: 4 mg via INTRAVENOUS
  Filled 2023-07-12: qty 2

## 2023-07-12 MED ORDER — ONDANSETRON 4 MG PO TBDP: 4.0000 mg | ORAL_TABLET | Freq: Three times a day (TID) | ORAL | 0 refills | Status: AC | PRN

## 2023-07-12 MED ORDER — PROMETHAZINE HCL 25 MG PO TABS: 25.0000 mg | ORAL_TABLET | Freq: Four times a day (QID) | ORAL | 0 refills | Status: AC | PRN

## 2023-07-12 NOTE — ED Provider Notes (Signed)
 Patient signed out to myself by Mandy Second, PA-C pending IV fluids and p.o. challenge.  Please refer to their note for full HPI, ROS, PE, and MDM.   Patient presents today for nausea, vomiting, and diarrhea that began 4 days ago.  Patient started Zepbound approximately 2 months ago.  Patient also reports upper abdominal pain.   Mild AKI on CMP, negative CT abdomen pelvis with contrast     Physical Exam  BP (!) 155/77   Pulse 70   Temp 97.7 F (36.5 C) (Oral)   Resp (!) 22   LMP 12/31/2011   SpO2 100%   Physical Exam Vitals and nursing note reviewed.  Constitutional:      General: She is not in acute distress.    Appearance: She is not toxic-appearing.     Comments: Uncomfortable appearing  HENT:     Head: Normocephalic and atraumatic.     Right Ear: External ear normal.     Left Ear: External ear normal.     Nose: Nose normal.     Mouth/Throat:     Mouth: Mucous membranes are moist.     Pharynx: Oropharynx is clear.   Eyes:     Extraocular Movements: Extraocular movements intact.     Pupils: Pupils are equal, round, and reactive to light.    Cardiovascular:     Rate and Rhythm: Normal rate.     Pulses: Normal pulses.  Pulmonary:     Effort: Pulmonary effort is normal.   Musculoskeletal:     Cervical back: Normal range of motion.   Skin:    General: Skin is warm and dry.     Capillary Refill: Capillary refill takes less than 2 seconds.   Neurological:     General: No focal deficit present.     Mental Status: She is alert.     Procedures  Procedures  ED Course / MDM    Medical Decision Making Amount and/or Complexity of Data Reviewed Labs: ordered. Radiology: ordered.  Risk Prescription drug management.   Patient able to tolerate p.o. intake without issue.  Patient considered for admission or further workup however patient able to tolerate p.o. intake and would like to be discharged at this time.  Patient's labs are reassuring with mild AKI  indicating mild dehydration and repleted with 2 L IVF.  Patient given return precautions.  I feel patient safe for discharge at this time.       Carie Charity, PA-C 07/12/23 0155    Long, Joshua G, MD 07/13/23 [phone]

## 2023-07-12 NOTE — Discharge Instructions (Signed)
 Today you were seen for vomiting and diarrhea.please pick up your medication and take as prescribed.  Please return to the ED if you have fever that is not controlled with Tylenol  or Motrin , uncontrollable vomiting, or worsening pain.  Thank you for letting us  treat you today. After reviewing your labs and imaging, I feel you are safe to go home. Please follow up with your PCP in the next several days and provide them with your records from this visit. Return to the Emergency Room if pain becomes severe or symptoms worsen.

## 2023-07-13 ENCOUNTER — Ambulatory Visit: Payer: Managed Care, Other (non HMO) | Admitting: Physician Assistant

## 2023-07-13 DIAGNOSIS — Z8639 Personal history of other endocrine, nutritional and metabolic disease: Secondary | ICD-10-CM

## 2023-07-13 DIAGNOSIS — Z8669 Personal history of other diseases of the nervous system and sense organs: Secondary | ICD-10-CM

## 2023-07-13 DIAGNOSIS — T7840XS Allergy, unspecified, sequela: Secondary | ICD-10-CM

## 2023-07-13 DIAGNOSIS — M19041 Primary osteoarthritis, right hand: Secondary | ICD-10-CM

## 2023-07-13 DIAGNOSIS — M7061 Trochanteric bursitis, right hip: Secondary | ICD-10-CM

## 2023-07-13 DIAGNOSIS — Z87898 Personal history of other specified conditions: Secondary | ICD-10-CM

## 2023-07-13 DIAGNOSIS — Z8679 Personal history of other diseases of the circulatory system: Secondary | ICD-10-CM

## 2023-07-13 DIAGNOSIS — M62838 Other muscle spasm: Secondary | ICD-10-CM

## 2023-07-13 DIAGNOSIS — Z8719 Personal history of other diseases of the digestive system: Secondary | ICD-10-CM

## 2023-07-13 DIAGNOSIS — Z9049 Acquired absence of other specified parts of digestive tract: Secondary | ICD-10-CM

## 2023-07-13 DIAGNOSIS — R768 Other specified abnormal immunological findings in serum: Secondary | ICD-10-CM

## 2023-07-13 DIAGNOSIS — M797 Fibromyalgia: Secondary | ICD-10-CM

## 2023-07-13 DIAGNOSIS — M7712 Lateral epicondylitis, left elbow: Secondary | ICD-10-CM

## 2023-07-13 DIAGNOSIS — M1611 Unilateral primary osteoarthritis, right hip: Secondary | ICD-10-CM

## 2023-07-13 DIAGNOSIS — Z8659 Personal history of other mental and behavioral disorders: Secondary | ICD-10-CM

## 2023-07-13 DIAGNOSIS — L719 Rosacea, unspecified: Secondary | ICD-10-CM

## 2023-08-27 NOTE — Progress Notes (Unsigned)
 Office Visit Note  Patient: Charlotte Fox             Date of Birth: 12-03-1965           MRN: [id number]             PCP: Aisha Harvey, MD Referring: Aisha Harvey, MD Visit Date: 08/28/2023 Occupation: @GUAROCC @  Subjective:  Generalized pain   History of Present Illness: Charlotte Fox is a 58 y.o. female with history of fibromyalgia and osteoarthritis.  Patient reports that she had been started on Zepbound and developed significant side effects including vomiting and diarrhea leading to evaluation in the ED on 07/11/23.  Patient states that she ended up passing out on 07/11/2023.  Patient states that since the fall she has had increased generalized arthralgias and myalgias.  She is been taking meloxicam 7.5 mg daily for pain relief and tizanidine  4 mg at bedtime.  She will be starting physical therapy under the orders of Dr. Aisha.  She continues to experience trapezius muscle tension and tenderness bilaterally.  Her discomfort is most significant in her back.  She is a side sleeper so she continues to have intermittent discomfort on the lateral aspect of both hips consistent with trochanteric bursitis.   Activities of Daily Living:  Patient reports morning stiffness for 2 hours.   Patient Reports nocturnal pain.  Difficulty dressing/grooming: Denies Difficulty climbing stairs: Denies Difficulty getting out of chair: Denies Difficulty using hands for taps, buttons, cutlery, and/or writing: Reports  Review of Systems  Constitutional:  Positive for fatigue.  HENT:  Negative for mouth sores and mouth dryness.   Eyes:  Negative for dryness.  Respiratory:  Negative for shortness of breath.   Cardiovascular:  Negative for chest pain and palpitations.  Gastrointestinal:  Positive for constipation. Negative for blood in stool and diarrhea.  Endocrine: Negative for increased urination.  Genitourinary:  Negative for involuntary urination.  Musculoskeletal:  Positive for joint  pain, joint pain, joint swelling, myalgias, muscle weakness, morning stiffness, muscle tenderness and myalgias. Negative for gait problem.  Skin:  Positive for sensitivity to sunlight. Negative for color change, rash and hair loss.  Allergic/Immunologic: Negative for susceptible to infections.  Neurological:  Negative for dizziness and headaches.  Hematological:  Negative for swollen glands.  Psychiatric/Behavioral:  Positive for depressed mood and sleep disturbance. The patient is not nervous/anxious.     PMFS History:  Patient Active Problem List   Diagnosis Date Noted   Allergic rhinitis due to pollen 08/19/2021   Anemia 08/19/2021   Anxiety 08/19/2021   Cerebral infarction (HCC) 08/19/2021   Cough variant asthma 08/19/2021   Functional dyspepsia 08/19/2021   Migraine without aura, not intractable, without status migrainosus 08/19/2021   Mild intermittent asthma 08/19/2021   Obstructive sleep apnea (adult) (pediatric) 08/19/2021   History of colonic polyps 08/19/2021   Surgical menopause 08/19/2021   Vitamin D  deficiency 08/19/2021   Hyperlipidemia, unspecified 08/19/2021   Hypertriglyceridemia 08/19/2021   Travel advice encounter 03/23/2017   Class 3 severe obesity due to excess calories without serious comorbidity with body mass index (BMI) of 40.0 to 44.9 in adult 12/13/2016   History of bowel resection 12/13/2016   History of hypertension 12/13/2016   History of depression 12/13/2016   History of ankle surgery 12/12/2016   Fatigue 12/10/2015   Fibromyalgia 12/09/2015   Osteoarthritis of both hands 12/09/2015   Osteoarthritis of right hip 12/09/2015   Hypertension 12/09/2015   Elevated cholesterol 12/09/2015   Migraines  12/09/2015   Insomnia 12/09/2015   History of endometriosis 12/09/2015   Ventral hernia without obstruction or gangrene 09/01/2014   Mood disorder (HCC) 10/21/2013   GERD (gastroesophageal reflux disease) 10/21/2013   Nausea with vomiting 10/21/2013    Hypokalemia 10/21/2013   Cellulitis of right leg 10/20/2013   Cellulitis of right foot 10/20/2013   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   Optic neuropathy 10/09/2012   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    Past Medical History:  Diagnosis Date   Acid reflux    Anemia    Arthritis    Asthma    Complication of anesthesia    propofol  dropped resp during colonoscopy, woke up during procedure   Depression    Elevated cholesterol 12/09/2015   Fibromyalgia    Headache    hx migraines   Heart murmur    does not give her any problems   High cholesterol    History of endometriosis 12/09/2015   History of hiatal hernia    Hypertension    Insomnia 12/09/2015   Migraines 12/09/2015   Osteoarthritis of both hands 12/09/2015   Osteoarthritis of right hip 12/09/2015   Mild   Patent foramen ovale    no longer sees cardiology   Retinal artery occlusion    Rosacea    dx by derm, per patient   Stroke [facility])    retinal artery   several yrs ago,  left eye diminished vision     Family History  Problem Relation Age of Onset   Hypertension Mother    Diabetes Mother    Arthritis Mother    Hypertension Brother    Cancer Other    Heart disease Other    Past Surgical History:  Procedure Laterality Date   ABDOMINAL HYSTERECTOMY     appendectomy/ colon   ANKLE SURGERY     at age 49   APPENDECTOMY     CHOLECYSTECTOMY N/A 05/21/2014   Procedure: LAPAROSCOPIC CHOLECYSTECTOMY WITH INTRAOPERATIVE CHOLANGIOGRAM;  Surgeon: Donnice Lima, MD;  Location: [facility] OR;  Service: General;  Laterality: N/A;   COLON SURGERY     removed 5 in colon during hysterectmy [place] due to mass   COLONOSCOPY     x 2   KNEE ARTHROPLASTY     KNEE SURGERY     TOE SURGERY     VENTRAL HERNIA REPAIR  09/01/2014   with mesh   VENTRAL HERNIA REPAIR N/A 09/01/2014   Procedure: LAPAROSCOPIC VENTRAL HERNIA REPAIR WITH MESH;  Surgeon: Donnice Lima, MD;  Location: [facility] OR;  Service: General;  Laterality: N/A;    Social History   Social History Narrative   Not on file   Immunization History  Administered Date(s) Administered   Hep A / Hep B 03/25/2021, 04/22/2021   Hepatitis A, Adult 03/23/2017   Hepatitis B 06/05/1997, 07/17/1997   Influenza,inj,Quad PF,6+ Mos 01/10/2016, 02/05/2017, 12/05/2018, 10/14/2020   Influenza,inj,Quad PF,6-35 Mos 11/08/2017, 10/07/2019   Influenza-Unspecified 11/08/2017, 12/05/2018   PFIZER(Purple Top)SARS-COV-2 Vaccination 04/05/2019, 04/26/2019, 11/21/2019   Pfizer Covid-19 Vaccine Bivalent Booster 43yrs & up 11/12/2020   Pneumococcal Polysaccharide-23 01/10/2016   Td 05/30/2001   Tdap 01/06/2009, 03/13/2019   Typhoid Inactivated 03/23/2017   Zoster, Live 03/13/2019, 10/31/2019     Objective: Vital Signs: BP 133/83 (BP Location: Left Arm, Patient Position: Sitting, Cuff Size: Normal)   Pulse 85   Resp 14   Ht 5' 6 (1.676 m)   Wt 224 lb (101.6 kg)   LMP 12/31/2011  BMI 36.15 kg/m    Physical Exam Vitals and nursing note reviewed.  Constitutional:      Appearance: She is well-developed.  HENT:     Head: Normocephalic and atraumatic.  Eyes:     Conjunctiva/sclera: Conjunctivae normal.  Cardiovascular:     Rate and Rhythm: Normal rate and regular rhythm.     Heart sounds: Normal heart sounds.  Pulmonary:     Effort: Pulmonary effort is normal.     Breath sounds: Normal breath sounds.  Abdominal:     General: Bowel sounds are normal.     Palpations: Abdomen is soft.  Musculoskeletal:     Cervical back: Normal range of motion.  Lymphadenopathy:     Cervical: No cervical adenopathy.  Skin:    General: Skin is warm and dry.     Capillary Refill: Capillary refill takes less than 2 seconds.  Neurological:     Mental Status: She is alert and oriented to person, place, and time.  Psychiatric:        Behavior: Behavior normal.      Musculoskeletal Exam: Generalized hyperalgesia and positive tender points on exam.  C-spine has limited range  of motion without rotation.  Trapezius muscle tension and tenderness bilaterally.  Limited mobility of the lumbar spine.  Shoulder joints, elbow joints, wrist joints, MCPs, PIPs, DIPs have good range of motion with no synovitis.  Complete fist formation bilaterally.  Hip joints have good range of motion with no groin pain.  Tenderness over bilateral trochanteric bursa.  Knee joints have good range of motion with no warmth or effusion.  Ankle joints have good range of motion with no tenderness or joint swelling.  CDAI Exam: CDAI Score: -- Patient Global: --; Provider Global: -- Swollen: --; Tender: -- Joint Exam 08/28/2023   No joint exam has been documented for this visit   There is currently no information documented on the homunculus. Go to the Rheumatology activity and complete the homunculus joint exam.  Investigation: No additional findings.  Imaging: No results found.  Recent Labs: Lab Results  Component Value Date   WBC 12.4 (H) 07/11/2023   HGB 15.2 (H) 07/11/2023   PLT 305 07/11/2023   NA 137 07/11/2023   K 3.7 07/11/2023   CL 102 07/11/2023   CO2 19 (L) 07/11/2023   GLUCOSE 142 (H) 07/11/2023   BUN 23 (H) 07/11/2023   CREATININE 1.52 (H) 07/11/2023   BILITOT 1.4 (H) 07/11/2023   ALKPHOS 43 07/11/2023   AST 22 07/11/2023   ALT 18 07/11/2023   PROT 7.2 07/11/2023   ALBUMIN 4.0 07/11/2023   CALCIUM  9.3 07/11/2023   GFRAA 89 06/09/2016    Speciality Comments: Polyethylene glycol allergy-patient cannot take methocarbamol .  Procedures:  No procedures performed Allergies: Erythromycin, Propofol , Sulfa antibiotics, Levofloxacin, Fexofenadine-pseudoephed er, Hydrocodone, Hydrocodone-acetaminophen , Penicillin g sodium, Rosuvastatin , Sulfamethoxazole, Azithromycin , Penicillins, Polyethylene glycol, and Vicodin [hydrocodone-acetaminophen ]   Assessment / Plan:     Visit Diagnoses: Primary osteoarthritis of both hands: She has PIP and DIP thickening consistent with  osteoarthritis of both hands.  No tenderness or synovitis noted.  Complete fist formation bilaterally.  Discussed the importance of joint protection and muscle strengthening. She was advised to notify us  if she develops any new or worsening symptoms.    Lateral epicondylitis of both elbows: Intermittent discomfort.   Primary osteoarthritis of right hip: No groin pain currently.  She has tenderness over the right trochanteric bursa.  Trochanteric bursitis of both hips: She has tenderness over bilateral  trochanteric bursa.  She is a side sleeper which exacerbates her symptoms especially at night.  She will be starting physical therapy.  Trapezius muscle spasm: She has trapezius muscle tension and tenderness bilaterally.  She will be starting physical therapy under the orders of Dr. Aisha.  She takes tizanidine  4 mg at bedtime which has been helpful.  A refill was sent to the pharmacy today.  Positive ANA (antinuclear antibody) - +ANA-no titer. anti-cardiolipin ab-, beta-2  glycoprotein antibodies negative, La 5.0, Ro-, SM-, RNP-, dsDNA negative.  Discussed the option of updating blood work today but she would like to hold off at this time since she has not developed any new or worsening symptoms.  Discussed signs and symptoms to monitor for closely.  Fibromyalgia - She has generalized myalgias and muscle tenderness due to fibromyalgia.  She had a fall on 07/11/2023 which has exacerbated her symptoms.  She is taking meloxicam 7.5 mg daily for pain relief and tizanidine  4 mg at bedtime.  A refill of tizanidine  was sent to the pharmacy today.  She will be starting physical therapy under the orders of Dr. Aisha.    History of insomnia: She is been taking tizanidine  4 mg at bedtime which has been helpful at alleviating her nocturnal pain.  Other medical conditions are listed as follows:  Rosacea  History of hypercholesterolemia  History of depression  History of hypertension: Blood pressure was  133/83 today in the office.  History of gastroesophageal reflux (GERD)  History of bowel resection  History of migraine  Orders: No orders of the defined types were placed in this encounter.  Meds ordered this encounter  Medications   tiZANidine  (ZANAFLEX ) 4 MG tablet    Sig: Take 1 tablet (4 mg total) by mouth at bedtime as needed for muscle spasms.    Dispense:  30 tablet    Refill:  2    Follow-Up Instructions: Return in about 6 months (around 02/28/2024) for Fibromyalgia.   Waddell CHRISTELLA Craze, PA-C  Note - This record has been created using Dragon software.  Chart creation errors have been sought, but may not always  have been located. Such creation errors do not reflect on  the standard of medical care.

## 2023-08-28 ENCOUNTER — Ambulatory Visit: Attending: Physician Assistant | Admitting: Physician Assistant

## 2023-08-28 ENCOUNTER — Encounter: Payer: Self-pay | Admitting: Physician Assistant

## 2023-08-28 VITALS — BP 133/83 | HR 85 | Resp 14 | Ht 66.0 in | Wt 224.0 lb

## 2023-08-28 DIAGNOSIS — M7711 Lateral epicondylitis, right elbow: Secondary | ICD-10-CM | POA: Diagnosis not present

## 2023-08-28 DIAGNOSIS — M1611 Unilateral primary osteoarthritis, right hip: Secondary | ICD-10-CM | POA: Diagnosis not present

## 2023-08-28 DIAGNOSIS — L719 Rosacea, unspecified: Secondary | ICD-10-CM

## 2023-08-28 DIAGNOSIS — Z8659 Personal history of other mental and behavioral disorders: Secondary | ICD-10-CM

## 2023-08-28 DIAGNOSIS — R768 Other specified abnormal immunological findings in serum: Secondary | ICD-10-CM

## 2023-08-28 DIAGNOSIS — M7712 Lateral epicondylitis, left elbow: Secondary | ICD-10-CM

## 2023-08-28 DIAGNOSIS — M797 Fibromyalgia: Secondary | ICD-10-CM

## 2023-08-28 DIAGNOSIS — Z8679 Personal history of other diseases of the circulatory system: Secondary | ICD-10-CM

## 2023-08-28 DIAGNOSIS — Z9049 Acquired absence of other specified parts of digestive tract: Secondary | ICD-10-CM

## 2023-08-28 DIAGNOSIS — M19041 Primary osteoarthritis, right hand: Secondary | ICD-10-CM

## 2023-08-28 DIAGNOSIS — Z87898 Personal history of other specified conditions: Secondary | ICD-10-CM

## 2023-08-28 DIAGNOSIS — M62838 Other muscle spasm: Secondary | ICD-10-CM

## 2023-08-28 DIAGNOSIS — Z8639 Personal history of other endocrine, nutritional and metabolic disease: Secondary | ICD-10-CM

## 2023-08-28 DIAGNOSIS — M19042 Primary osteoarthritis, left hand: Secondary | ICD-10-CM

## 2023-08-28 DIAGNOSIS — Z8719 Personal history of other diseases of the digestive system: Secondary | ICD-10-CM

## 2023-08-28 DIAGNOSIS — M7062 Trochanteric bursitis, left hip: Secondary | ICD-10-CM

## 2023-08-28 DIAGNOSIS — M7061 Trochanteric bursitis, right hip: Secondary | ICD-10-CM | POA: Diagnosis not present

## 2023-08-28 DIAGNOSIS — Z8669 Personal history of other diseases of the nervous system and sense organs: Secondary | ICD-10-CM

## 2023-08-28 MED ORDER — TIZANIDINE HCL 4 MG PO TABS: 4.0000 mg | ORAL_TABLET | Freq: Every evening | ORAL | 2 refills | Status: AC | PRN

## 2024-02-15 IMAGING — MG MM DIGITAL SCREENING BILAT W/ TOMO AND CAD
8 series · 8 of 24 positions shown · non-contrast
Comparison: Previous exam(s).

CLINICAL DATA: Screening.

EXAM:
DIGITAL SCREENING BILATERAL MAMMOGRAM WITH TOMOSYNTHESIS AND CAD
TECHNIQUE: Bilateral screening digital craniocaudal and mediolateral oblique
mammograms were obtained. Bilateral screening digital breast
tomosynthesis was performed. The images were evaluated with
computer-aided detection.

[R CC synth-2D]
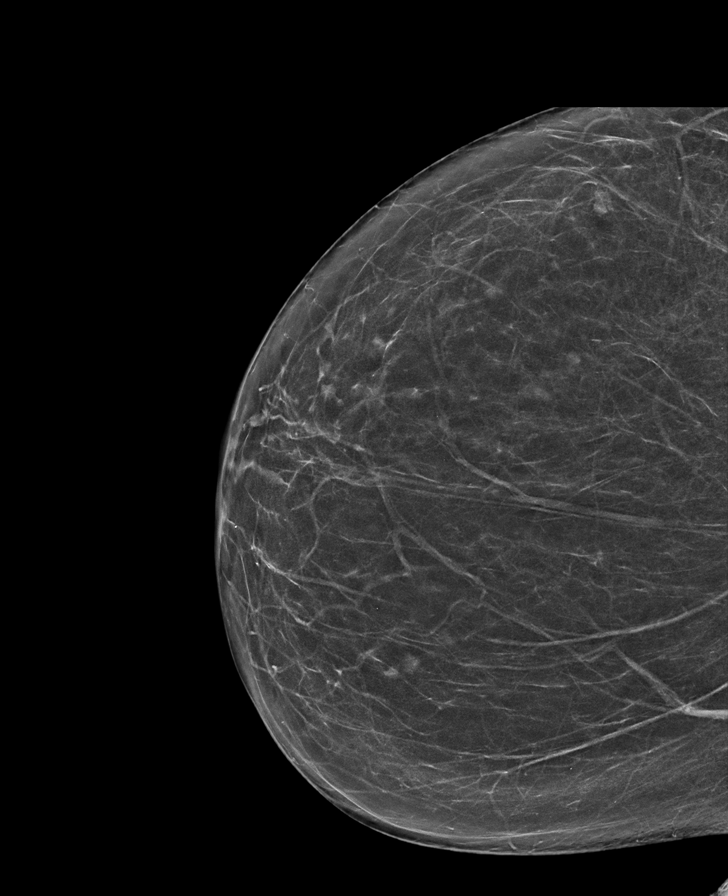

[R MLO synth-2D]
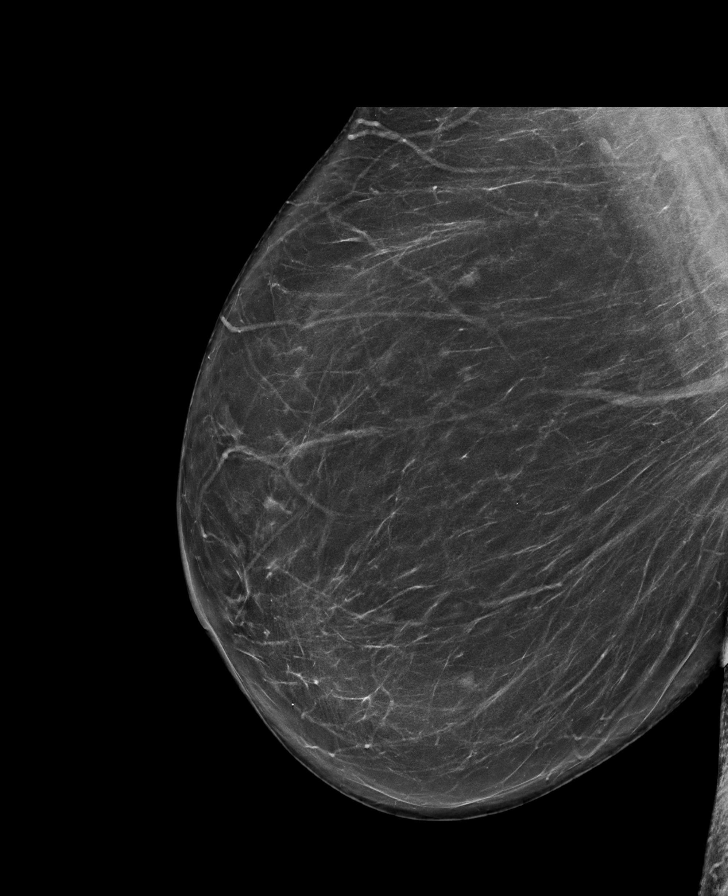

[L CC synth-2D]
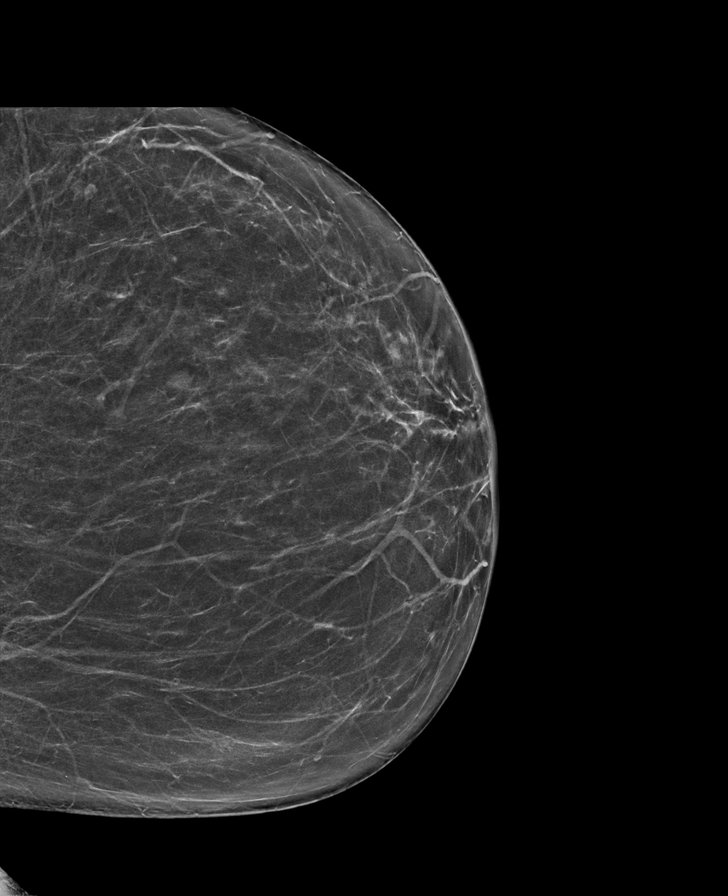

[L MLO synth-2D]
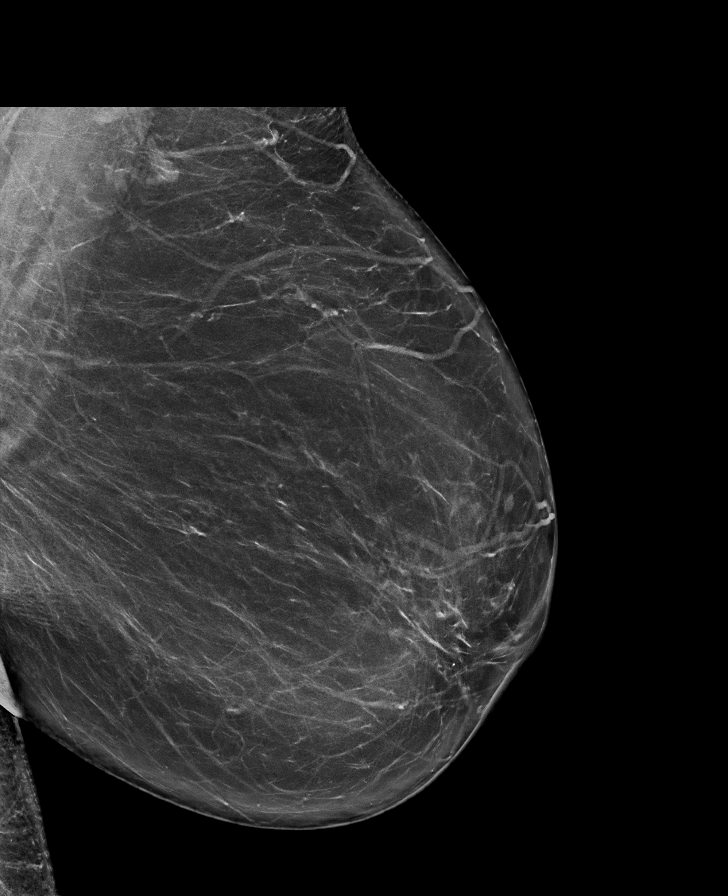

[R MLO tomo · tomo slice 43/86.0]
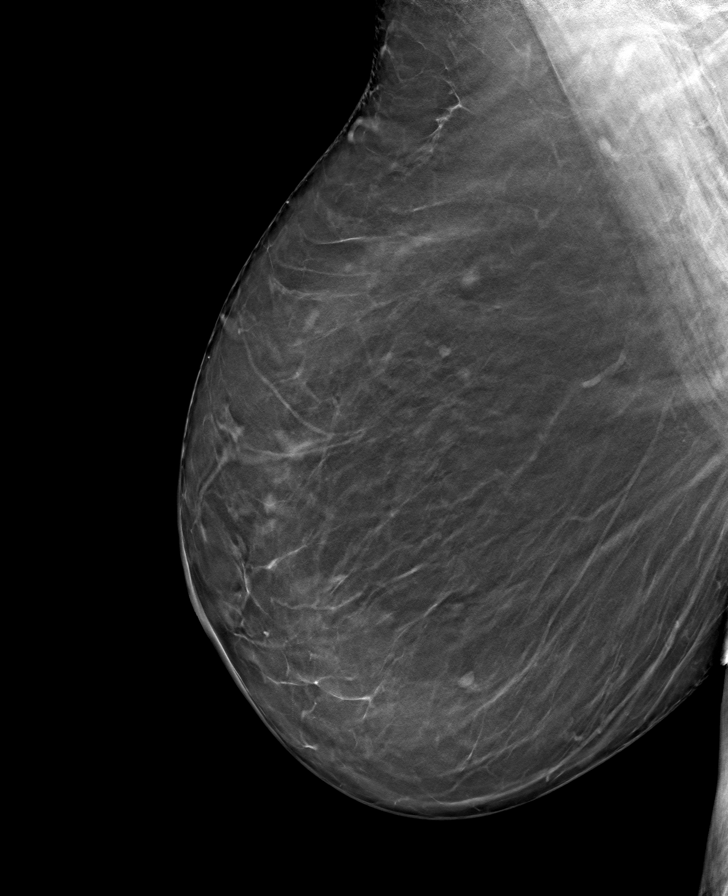

[R CC tomo · tomo slice 36/71.0]
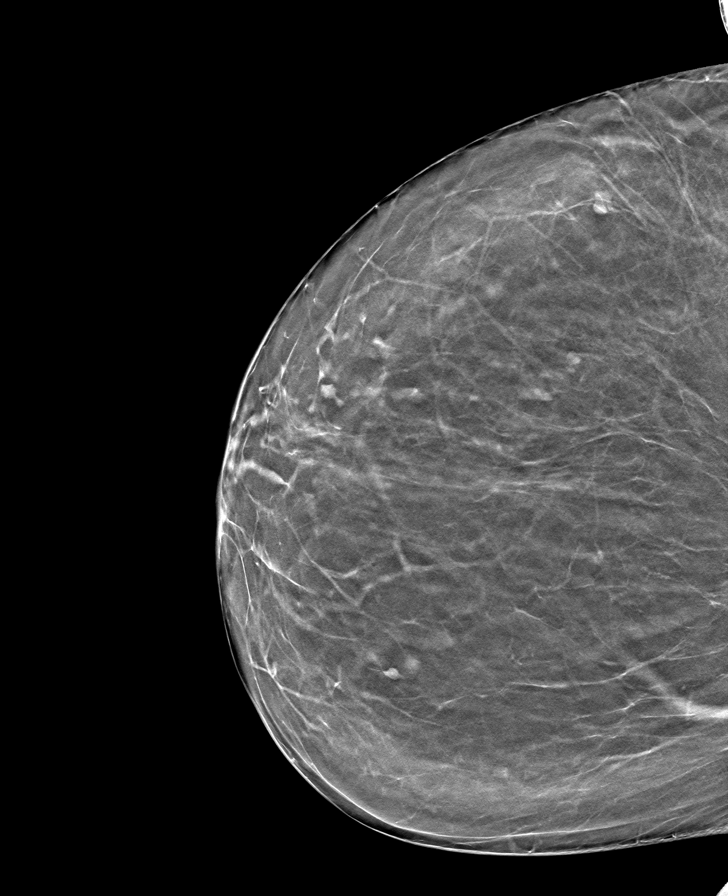

[L MLO tomo · tomo slice 44/87.0]
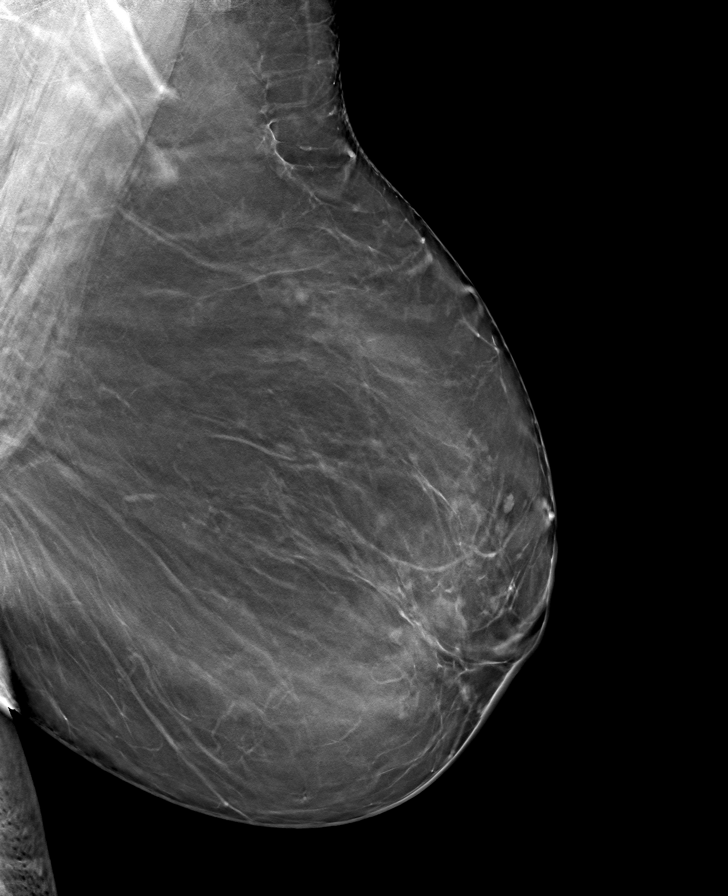

[L CC tomo · tomo slice 35/70.0]
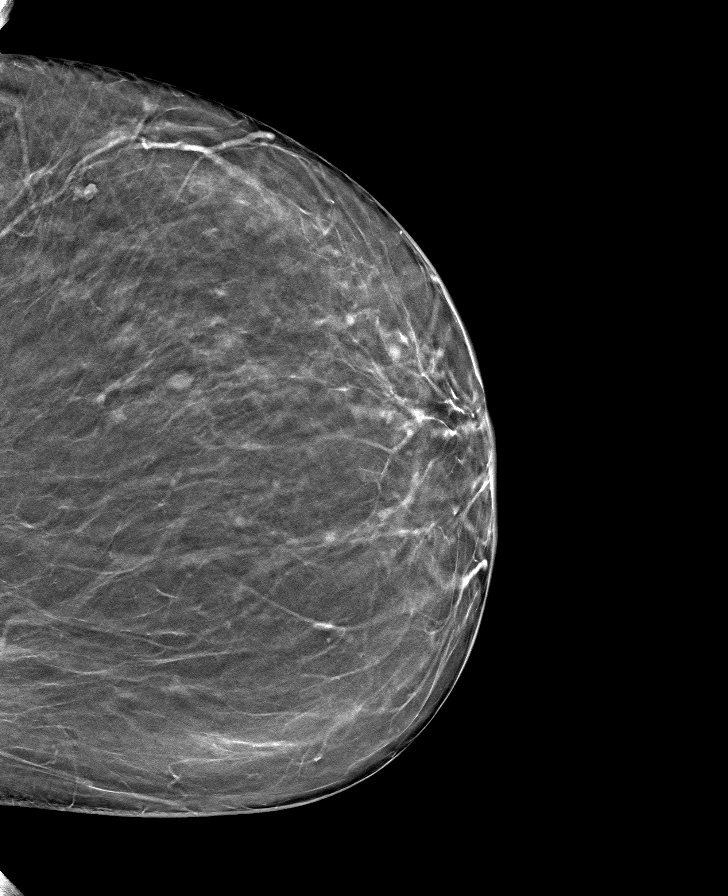

[8 of 24 positions shown; findings below may reference images not displayed]

ACR Breast Density Category b: There are scattered areas of
fibroglandular density.
FINDINGS: There are no findings suspicious for malignancy.
IMPRESSION: No mammographic evidence of malignancy. A result letter of this
screening mammogram will be mailed directly to the patient.

RECOMMENDATION:
Screening mammogram in one year. (Code:51-O-LD2)

BI-RADS CATEGORY  1: Negative.

## 2024-03-05 ENCOUNTER — Encounter: Payer: Self-pay | Admitting: Rheumatology

## 2024-03-05 ENCOUNTER — Ambulatory Visit: Admitting: Rheumatology

## 2024-03-05 VITALS — BP 136/82 | HR 81 | Temp 98.2°F | Resp 16 | Ht 66.0 in | Wt 232.8 lb

## 2024-03-05 DIAGNOSIS — Z8719 Personal history of other diseases of the digestive system: Secondary | ICD-10-CM

## 2024-03-05 DIAGNOSIS — L719 Rosacea, unspecified: Secondary | ICD-10-CM

## 2024-03-05 DIAGNOSIS — M797 Fibromyalgia: Secondary | ICD-10-CM

## 2024-03-05 DIAGNOSIS — M7061 Trochanteric bursitis, right hip: Secondary | ICD-10-CM

## 2024-03-05 DIAGNOSIS — R7689 Other specified abnormal immunological findings in serum: Secondary | ICD-10-CM | POA: Diagnosis not present

## 2024-03-05 DIAGNOSIS — Z8679 Personal history of other diseases of the circulatory system: Secondary | ICD-10-CM | POA: Diagnosis not present

## 2024-03-05 DIAGNOSIS — Z8639 Personal history of other endocrine, nutritional and metabolic disease: Secondary | ICD-10-CM

## 2024-03-05 DIAGNOSIS — M7062 Trochanteric bursitis, left hip: Secondary | ICD-10-CM

## 2024-03-05 DIAGNOSIS — M19041 Primary osteoarthritis, right hand: Secondary | ICD-10-CM | POA: Diagnosis not present

## 2024-03-05 DIAGNOSIS — M1611 Unilateral primary osteoarthritis, right hip: Secondary | ICD-10-CM | POA: Diagnosis not present

## 2024-03-05 DIAGNOSIS — M7712 Lateral epicondylitis, left elbow: Secondary | ICD-10-CM

## 2024-03-05 DIAGNOSIS — Z9049 Acquired absence of other specified parts of digestive tract: Secondary | ICD-10-CM

## 2024-03-05 DIAGNOSIS — Z87898 Personal history of other specified conditions: Secondary | ICD-10-CM

## 2024-03-05 DIAGNOSIS — M62838 Other muscle spasm: Secondary | ICD-10-CM | POA: Diagnosis not present

## 2024-03-05 DIAGNOSIS — Z8669 Personal history of other diseases of the nervous system and sense organs: Secondary | ICD-10-CM

## 2024-03-05 DIAGNOSIS — M7711 Lateral epicondylitis, right elbow: Secondary | ICD-10-CM

## 2024-03-05 DIAGNOSIS — M19042 Primary osteoarthritis, left hand: Secondary | ICD-10-CM

## 2024-03-05 DIAGNOSIS — Z8659 Personal history of other mental and behavioral disorders: Secondary | ICD-10-CM | POA: Diagnosis not present

## 2024-03-05 NOTE — Addendum Note (Signed)
 Addended by: KNUTE REENA DEL on: 03/05/2024 08:38 AM   Modules accepted: Orders

## 2024-03-07 LAB — CBC WITH DIFFERENTIAL/PLATELET
Basophils Absolute: 0 10*3/uL (ref 0.0–0.2)
Basos: 1 %
EOS (ABSOLUTE): 0.1 10*3/uL (ref 0.0–0.4)
Eos: 3 %
Hematocrit: 41.5 % (ref 34.0–46.6)
Hemoglobin: 13.6 g/dL (ref 11.1–15.9)
Immature Grans (Abs): 0 10*3/uL (ref 0.0–0.1)
Immature Granulocytes: 0 %
Lymphocytes Absolute: 1.3 10*3/uL (ref 0.7–3.1)
Lymphs: 23 %
MCH: 29.6 pg (ref 26.6–33.0)
MCHC: 32.8 g/dL (ref 31.5–35.7)
MCV: 90 fL (ref 79–97)
Monocytes Absolute: 0.4 10*3/uL (ref 0.1–0.9)
Monocytes: 6 %
Neutrophils Absolute: 3.8 10*3/uL (ref 1.4–7.0)
Neutrophils: 67 %
Platelets: 268 10*3/uL (ref 150–450)
RBC: 4.6 x10E6/uL (ref 3.77–5.28)
RDW: 12.9 % (ref 11.7–15.4)
WBC: 5.7 10*3/uL (ref 3.4–10.8)

## 2024-03-07 LAB — PROTEIN / CREATININE RATIO, URINE

## 2024-03-07 LAB — COMPREHENSIVE METABOLIC PANEL WITH GFR
ALT: 14 [IU]/L (ref 0–32)
AST: 18 [IU]/L (ref 0–40)
Albumin: 4.1 g/dL (ref 3.8–4.9)
Alkaline Phosphatase: 71 [IU]/L (ref 49–135)
BUN/Creatinine Ratio: 14 (ref 9–23)
BUN: 15 mg/dL (ref 6–24)
Bilirubin Total: 0.4 mg/dL (ref 0.0–1.2)
CO2: 21 mmol/L (ref 20–29)
Calcium: 9.4 mg/dL (ref 8.7–10.2)
Chloride: 106 mmol/L (ref 96–106)
Creatinine, Ser: 1.1 mg/dL — ABNORMAL HIGH (ref 0.57–1.00)
Globulin, Total: 2.1 g/dL (ref 1.5–4.5)
Glucose: 95 mg/dL (ref 70–99)
Potassium: 4.6 mmol/L (ref 3.5–5.2)
Sodium: 143 mmol/L (ref 134–144)
Total Protein: 6.2 g/dL (ref 6.0–8.5)
eGFR: 59 mL/min/{1.73_m2} — ABNORMAL LOW

## 2024-03-07 LAB — C3 AND C4
Complement C3, Serum: 162 mg/dL (ref 82–167)
Complement C4, Serum: 26 mg/dL (ref 12–38)

## 2024-03-07 LAB — SJOGRENS SYNDROME-B EXTRACTABLE NUCLEAR ANTIBODY

## 2024-03-07 LAB — ANA

## 2024-03-07 LAB — SJOGRENS SYNDROME-A EXTRACTABLE NUCLEAR ANTIBODY

## 2024-03-07 LAB — SEDIMENTATION RATE: Sed Rate: 17 mm/h (ref 0–40)

## 2024-03-07 LAB — ANTI-DNA ANTIBODY, DOUBLE-STRANDED

## 2024-09-03 ENCOUNTER — Ambulatory Visit: Admitting: Rheumatology
